# Patient Record
Sex: Female | Born: 1980
Health system: Southern US, Community
[De-identification: ages and names within clinical notes are randomized; demographics above are authoritative.]

## PROBLEM LIST (undated history)

## (undated) ENCOUNTER — Inpatient Hospital Stay (HOSPITAL_COMMUNITY): Payer: Self-pay

## (undated) DIAGNOSIS — O219 Vomiting of pregnancy, unspecified: Secondary | ICD-10-CM

## (undated) DIAGNOSIS — F419 Anxiety disorder, unspecified: Secondary | ICD-10-CM

## (undated) DIAGNOSIS — F32A Depression, unspecified: Secondary | ICD-10-CM

## (undated) DIAGNOSIS — F1110 Opioid abuse, uncomplicated: Secondary | ICD-10-CM

## (undated) DIAGNOSIS — Z22322 Carrier or suspected carrier of Methicillin resistant Staphylococcus aureus: Secondary | ICD-10-CM

## (undated) DIAGNOSIS — N764 Abscess of vulva: Secondary | ICD-10-CM

## (undated) DIAGNOSIS — F329 Major depressive disorder, single episode, unspecified: Secondary | ICD-10-CM

## (undated) DIAGNOSIS — O24419 Gestational diabetes mellitus in pregnancy, unspecified control: Secondary | ICD-10-CM

## (undated) DIAGNOSIS — F17200 Nicotine dependence, unspecified, uncomplicated: Secondary | ICD-10-CM

## (undated) HISTORY — PX: TONSILLECTOMY: SUR1361

## (undated) HISTORY — PX: CHOLECYSTECTOMY: SHX55

## (undated) HISTORY — DX: Vomiting of pregnancy, unspecified: O21.9

## (undated) HISTORY — DX: Gestational diabetes mellitus in pregnancy, unspecified control: O24.419

## (undated) HISTORY — DX: Nicotine dependence, unspecified, uncomplicated: F17.200

---
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
## 2002-08-05 ENCOUNTER — Encounter: Payer: Self-pay | Admitting: Emergency Medicine

## 2002-08-05 ENCOUNTER — Emergency Department (HOSPITAL_COMMUNITY): Admission: EM | Admit: 2002-08-05 | Discharge: 2002-08-05 | Payer: Self-pay | Admitting: Emergency Medicine

## 2002-08-27 ENCOUNTER — Ambulatory Visit (HOSPITAL_COMMUNITY): Admission: RE | Admit: 2002-08-27 | Discharge: 2002-08-28 | Payer: Self-pay | Admitting: General Surgery

## 2002-08-27 ENCOUNTER — Encounter: Payer: Self-pay | Admitting: General Surgery

## 2002-08-27 ENCOUNTER — Encounter (INDEPENDENT_AMBULATORY_CARE_PROVIDER_SITE_OTHER): Payer: Self-pay | Admitting: *Deleted

## 2004-06-02 ENCOUNTER — Other Ambulatory Visit: Admission: RE | Admit: 2004-06-02 | Discharge: 2004-06-02 | Payer: Self-pay | Admitting: Obstetrics and Gynecology

## 2007-01-10 ENCOUNTER — Emergency Department (HOSPITAL_COMMUNITY): Admission: EM | Admit: 2007-01-10 | Discharge: 2007-01-10 | Payer: Self-pay | Admitting: Emergency Medicine

## 2007-06-17 ENCOUNTER — Emergency Department (HOSPITAL_COMMUNITY): Admission: EM | Admit: 2007-06-17 | Discharge: 2007-06-17 | Payer: Self-pay | Admitting: Emergency Medicine

## 2008-12-13 ENCOUNTER — Emergency Department (HOSPITAL_COMMUNITY): Admission: EM | Admit: 2008-12-13 | Discharge: 2008-12-13 | Payer: Self-pay | Admitting: Emergency Medicine

## 2009-04-18 ENCOUNTER — Emergency Department (HOSPITAL_COMMUNITY): Admission: EM | Admit: 2009-04-18 | Discharge: 2009-04-18 | Payer: Self-pay | Admitting: Family Medicine

## 2009-09-03 ENCOUNTER — Inpatient Hospital Stay (HOSPITAL_COMMUNITY): Admission: AD | Admit: 2009-09-03 | Discharge: 2009-09-06 | Payer: Self-pay | Admitting: Obstetrics and Gynecology

## 2009-09-03 ENCOUNTER — Encounter: Payer: Self-pay | Admitting: Obstetrics and Gynecology

## 2009-10-27 ENCOUNTER — Emergency Department (HOSPITAL_COMMUNITY): Admission: EM | Admit: 2009-10-27 | Discharge: 2009-10-28 | Payer: Self-pay | Admitting: Emergency Medicine

## 2010-03-24 LAB — TRICYCLICS SCREEN, URINE: TCA Scrn: NOT DETECTED

## 2010-03-24 LAB — BASIC METABOLIC PANEL
BUN: 12 mg/dL (ref 6–23)
Chloride: 106 mEq/L (ref 96–112)
Creatinine, Ser: 0.82 mg/dL (ref 0.4–1.2)
Glucose, Bld: 78 mg/dL (ref 70–99)
Potassium: 4 mEq/L (ref 3.5–5.1)

## 2010-03-24 LAB — CBC
HCT: 35 % — ABNORMAL LOW (ref 36.0–46.0)
MCHC: 32.8 g/dL (ref 30.0–36.0)
MCV: 83 fL (ref 78.0–100.0)
Platelets: 336 10*3/uL (ref 150–400)
RDW: 19.2 % — ABNORMAL HIGH (ref 11.5–15.5)
WBC: 7.7 10*3/uL (ref 4.0–10.5)

## 2010-03-24 LAB — RAPID URINE DRUG SCREEN, HOSP PERFORMED
Amphetamines: NOT DETECTED
Barbiturates: NOT DETECTED
Benzodiazepines: NOT DETECTED
Cocaine: NOT DETECTED

## 2010-03-24 LAB — DIFFERENTIAL
Basophils Absolute: 0.1 10*3/uL (ref 0.0–0.1)
Basophils Relative: 1 % (ref 0–1)
Eosinophils Absolute: 0.1 10*3/uL (ref 0.0–0.7)
Eosinophils Relative: 2 % (ref 0–5)
Monocytes Absolute: 0.4 10*3/uL (ref 0.1–1.0)

## 2010-03-24 LAB — ETHANOL: Alcohol, Ethyl (B): <5 mg/dL (ref 0–10)

## 2010-03-26 LAB — RPR: RPR Ser Ql: NONREACTIVE

## 2010-03-26 LAB — COMPREHENSIVE METABOLIC PANEL
AST: 26 U/L (ref 0–37)
CO2: 19 mEq/L (ref 19–32)
Calcium: 9.4 mg/dL (ref 8.4–10.5)
Creatinine, Ser: 0.45 mg/dL (ref 0.4–1.2)
GFR calc Af Amer: >60 mL/min (ref >60)
GFR calc non Af Amer: >60 mL/min (ref >60)
Sodium: 134 mEq/L — ABNORMAL LOW (ref 135–145)
Total Protein: 6.4 g/dL (ref 6.0–8.3)

## 2010-03-26 LAB — CBC
HCT: 30.1 % — ABNORMAL LOW (ref 36.0–46.0)
HCT: 38.1 % (ref 36.0–46.0)
Hemoglobin: 10.6 g/dL — ABNORMAL LOW (ref 12.0–15.0)
MCH: 33.3 pg (ref 26.0–34.0)
MCHC: 34.3 g/dL (ref 30.0–36.0)
Platelets: 217 10*3/uL (ref 150–400)
RBC: 3.18 MIL/uL — ABNORMAL LOW (ref 3.87–5.11)
RDW: 13.4 % (ref 11.5–15.5)
WBC: 12.6 10*3/uL — ABNORMAL HIGH (ref 4.0–10.5)

## 2010-03-26 LAB — URINALYSIS, DIPSTICK ONLY
Bilirubin Urine: NEGATIVE
Glucose, UA: NEGATIVE mg/dL
Leukocytes, UA: NEGATIVE
Nitrite: NEGATIVE
Specific Gravity, Urine: 1.02 (ref 1.005–1.030)
pH: 7.5 (ref 5.0–8.0)

## 2010-04-13 LAB — POCT I-STAT, CHEM 8
BUN: 11 mg/dL (ref 6–23)
Calcium, Ion: 1.03 mmol/L — ABNORMAL LOW (ref 1.12–1.32)
Chloride: 107 mEq/L (ref 96–112)
Creatinine, Ser: 0.5 mg/dL (ref 0.4–1.2)
Glucose, Bld: 73 mg/dL (ref 70–99)
HCT: 39 % (ref 36.0–46.0)
Hemoglobin: 13.3 g/dL (ref 12.0–15.0)
Potassium: 3.6 meq/L (ref 3.5–5.1)
Sodium: 138 mEq/L (ref 135–145)
TCO2: 21 mmol/L (ref 0–100)

## 2010-04-13 LAB — HCG, SERUM, QUALITATIVE: Preg, Serum: NEGATIVE

## 2010-05-28 NOTE — Op Note (Signed)
NAME:  Alexis Avery, Alexis Avery NO.:  [id number]   MEDICAL RECORD NO.:  [id number]                   PATIENT TYPE:  OIB   LOCATION:  5743                                 FACILITY:  [facility]   PHYSICIAN:  Gabrielle Dare. Janee Morn, M.D.             DATE OF BIRTH:  10/12/1980   DATE OF PROCEDURE:  08/27/2002  DATE OF DISCHARGE:                                 OPERATIVE REPORT   PREOPERATIVE DIAGNOSIS:  Acalculous cholecystitis versus biliary dyskinesia.   POSTOPERATIVE DIAGNOSIS:  Acalculous cholecystitis versus biliary  dyskinesia.   OPERATION PERFORMED:  Laparoscopic cholecystectomy with intraoperative  cholangiogram.   SURGEON:  Gabrielle Dare. Janee Morn, M.D.   ASSISTANT:  Adolph Pollack, M.D.   ANESTHESIA:  General.   INDICATIONS FOR PROCEDURE:  The patient is a 30 year old white female with a  one-year history of right upper quadrant pain with some associated nausea  and vomiting after eating fatty foods.  Work-up in the emergency department  demonstrated some edema in the neck of the gallbladder but gallbladder  stones.  She was tender with the ultrasound exam and she was evaluated in  the office.  She presents for elective cholecystectomy.   DESCRIPTION OF PROCEDURE:  Informed consent was obtained.  Intravenous  antibiotics were given.  The patient was taken to the operating room.  General anesthesia was administered.  Her abdomen was prepped and draped in  the sterile fashion.  A curvilinear infraumbilical incision was made.  Subcutaneous tissues were dissected down, revealing the anterior fascia  which was divided sharply.  The peritoneal cavity was entered under direct  vision.  Subsequently a pursestring suture of 0 Vicryl was placed around the  fascial opening.  The Hasson trocar was inserted and the abdomen was  insufflated with carbon dioxide in standard fashion.  Subsequently, an 11mm  epigastric and two 5mm lateral ports were placed.  The dome of  the  gallbladder was retracted superomedially and the infundibulum was retracted  inferolaterally.  The dissection began laterally and progressed medially  revealing the cystic duct.  The gallbladder and cystic duct area appeared  mildly inflamed.  The cystic duct was encircled without difficulty.  A large  window was made between the infundibulum and the cystic duct and the liver.  Once excellent visualization was obtained, a clip was placed on the  infundibulocystic duct junction.  A small nick was made in the cystic duct  and a Reddick cholangiogram catheter was inserted.  An intraoperative  cholangiogram was obtained which demonstrated no filling defects in the  common bile duct and good flow into the duodenum.  Subsequently, the  cholangiogram catheter was removed.  Three clips were placed proximally on  the cystic duct and it was divided.  The further dissection revealed the  cystic artery. This was clipped twice proximally, once distally and divided.  We then proceeded to take the gallbladder off the liver bed.  During  this  dissection, a posterior branch of the cystic artery was encountered.  This  was clipped twice proximally and  divided distally.  The gallbladder was  taken off of the liver bed with Bovie cautery.  Several areas of the liver  were cauterized to get excellent hemostasis.  The gallbladder was placed in  the EndoCatch bag and removed from the abdomen via the umbilical port site.  The abdomen was copiously irrigated.  The liver bed was again cauterized in  a few areas to obtain excellent hemostasis.  Once this was acquired, the  abdomen was then irrigated.  The irrigant returned clear.  The liver bed was  rechecked and noted to be hemostatic.  All irrigation fluid was removed  which was clear. The ports were removed under direct vision.  The Hasson  trocar was removed.  The umbilical fascial defect was closed by tying the 0  Vicryl pursestring suture.  All four  wounds were copiously irrigated and  then closed with 4-0 Vicryl subcuticular stitches.  0.25% Marcaine with  epinephrine had been used at all port sites for local anesthetic and sponge,  needle and instrument counts were correct.  Benzoin and Steri-Strips and  sterile dressings were applied.  The patient tolerated the procedure without  apparent complications and was taken to recovery room in stable condition.                                                Gabrielle Dare Janee Morn, M.D.    BET/MEDQ  D:  08/27/2002  T:  08/28/2002  Job:  [id number]

## 2010-10-07 LAB — DIFFERENTIAL
Eosinophils Absolute: 0.2
Eosinophils Relative: 1
Lymphs Abs: 4.6 — ABNORMAL HIGH
Monocytes Absolute: 0.9
Monocytes Relative: 7

## 2010-10-07 LAB — CBC
HCT: 33.9 — ABNORMAL LOW
MCV: 89.3
RBC: 3.8 — ABNORMAL LOW
WBC: 13.4 — ABNORMAL HIGH

## 2010-10-07 LAB — POCT I-STAT, CHEM 8
Calcium, Ion: 1.14
Chloride: 107
Glucose, Bld: 94
HCT: 36
Hemoglobin: 12.2
Potassium: 3.7

## 2010-10-07 LAB — URINALYSIS, ROUTINE W REFLEX MICROSCOPIC
Glucose, UA: NEGATIVE
Ketones, ur: 15 — AB
Protein, ur: 100 — AB

## 2010-10-07 LAB — POCT PREGNANCY, URINE: Operator id: 285491

## 2010-10-07 LAB — RPR: RPR Ser Ql: NONREACTIVE

## 2010-10-07 LAB — URINE MICROSCOPIC-ADD ON

## 2010-10-15 LAB — I-STAT 8, (EC8 V) (CONVERTED LAB)
BUN: 6
Bicarbonate: 19.9 — ABNORMAL LOW
Chloride: 109
pCO2, Ven: 27.7 — ABNORMAL LOW
pH, Ven: 7.465 — ABNORMAL HIGH

## 2010-10-15 LAB — D-DIMER, QUANTITATIVE: D-Dimer, Quant: 0.66 — ABNORMAL HIGH

## 2010-10-15 LAB — POCT CARDIAC MARKERS
CKMB, poc: <1 — ABNORMAL LOW
Myoglobin, poc: 53.2
Operator id: 198171
Troponin i, poc: <0.05

## 2012-05-21 ENCOUNTER — Emergency Department (HOSPITAL_COMMUNITY)
Admission: EM | Admit: 2012-05-21 | Discharge: 2012-05-21 | Disposition: A | Discharge status: Home / Self Care | Payer: Self-pay

## 2012-05-21 ENCOUNTER — Inpatient Hospital Stay (HOSPITAL_COMMUNITY)
Admission: AD | Admit: 2012-05-21 | Discharge: 2012-05-21 | Disposition: A | Discharge status: Home / Self Care | Payer: Medicaid Other | Source: Ambulatory Visit | Attending: Obstetrics & Gynecology | Admitting: Obstetrics & Gynecology

## 2012-05-21 ENCOUNTER — Inpatient Hospital Stay (HOSPITAL_COMMUNITY): Payer: Medicaid Other

## 2012-05-21 ENCOUNTER — Encounter (HOSPITAL_COMMUNITY): Payer: Self-pay | Admitting: *Deleted

## 2012-05-21 DIAGNOSIS — O039 Complete or unspecified spontaneous abortion without complication: Secondary | ICD-10-CM | POA: Insufficient documentation

## 2012-05-21 DIAGNOSIS — R109 Unspecified abdominal pain: Secondary | ICD-10-CM | POA: Insufficient documentation

## 2012-05-21 HISTORY — DX: Major depressive disorder, single episode, unspecified: F32.9

## 2012-05-21 HISTORY — DX: Depression, unspecified: F32.A

## 2012-05-21 LAB — CBC
MCH: 29.6 pg (ref 26.0–34.0)
Platelets: 212 10*3/uL (ref 150–400)
RBC: 3.62 MIL/uL — ABNORMAL LOW (ref 3.87–5.11)
WBC: 10.3 10*3/uL (ref 4.0–10.5)

## 2012-05-21 LAB — URINALYSIS, ROUTINE W REFLEX MICROSCOPIC
Ketones, ur: NEGATIVE mg/dL
Leukocytes, UA: NEGATIVE
Nitrite: NEGATIVE
Protein, ur: NEGATIVE mg/dL
Urobilinogen, UA: 0.2 mg/dL (ref 0.0–1.0)

## 2012-05-21 LAB — URINE MICROSCOPIC-ADD ON

## 2012-05-21 LAB — WET PREP, GENITAL: Yeast Wet Prep HPF POC: NONE SEEN

## 2012-05-21 LAB — HCG, SERUM, QUALITATIVE: Preg, Serum: POSITIVE — AB

## 2012-05-21 NOTE — MAU Note (Signed)
Patient was told at [facility] that her pregnancy test was positive.

## 2012-05-21 NOTE — MAU Provider Note (Signed)
History     CSN: [id number]  Arrival date and time: 05/21/12 1753   None     Chief Complaint  Patient presents with  . Abdominal Cramping  . Vaginal Bleeding   HPI 32 y.o. G1P0101 at Unknown with cramping and bleeding starting today. Had + pregnancy test at [facility] last week.   Past Medical History  Diagnosis Date  . Depression     Past Surgical History  Procedure Laterality Date  . Cholecystectomy    . Tonsillectomy    . Vaginal delivery      X 1    History reviewed. No pertinent family history.  History  Substance Use Topics  . Smoking status: Current Every Day Smoker -- 1.00 packs/day    Types: Cigarettes  . Smokeless tobacco: Never Used  . Alcohol Use: No    Allergies: No Known Allergies  Prescriptions prior to admission  Medication Sig Dispense Refill  . acetaminophen (TYLENOL) 325 MG tablet Take 650 mg by mouth daily as needed for pain.      Marland Kitchen FLUoxetine (PROZAC) 20 MG capsule Take 20 mg by mouth daily.      Marland Kitchen gabapentin (NEURONTIN) 300 MG capsule Take 300 mg by mouth 2 (two) times daily.      . Prenatal Vit-Fe Fumarate-FA (PRENATAL MULTIVITAMIN) TABS Take 1 tablet by mouth daily at 12 noon.        Review of Systems  Constitutional: Negative.   Respiratory: Negative.   Cardiovascular: Negative.   Gastrointestinal: Positive for abdominal pain. Negative for nausea, vomiting, diarrhea and constipation.  Genitourinary: Negative for dysuria, urgency, frequency, hematuria and flank pain.       Positive for vaginal bleeding   Musculoskeletal: Negative.   Neurological: Negative.   Psychiatric/Behavioral: Negative.    Physical Exam   Blood pressure 131/79, pulse 84, temperature 98.8 F (37.1 C), temperature source Oral, resp. rate 16, height 4\' 9"  (1.448 m), weight 101 lb 4 oz (45.927 kg), last menstrual period 03/29/2012, SpO2 100.00%.  Physical Exam  Nursing note and vitals reviewed. Constitutional: She is oriented to person, place, and time. She  appears well-developed and well-nourished. No distress.  Cardiovascular: Normal rate.   Respiratory: Effort normal.  GI: Soft. There is no tenderness.  Genitourinary: There is no rash, tenderness or lesion on the right labia. There is no rash, tenderness or lesion on the left labia. Uterus is tender. Right adnexum displays tenderness. Left adnexum displays tenderness. There is bleeding (heavy) around the vagina.  Musculoskeletal: Normal range of motion.  Neurological: She is alert and oriented to person, place, and time.  Skin: Skin is warm and dry.  Psychiatric: She has a normal mood and affect.    MAU Course  Procedures Results for orders placed during the hospital encounter of 05/21/12 (from the past 24 hour(s))  URINALYSIS, ROUTINE W REFLEX MICROSCOPIC     Status: Abnormal   Collection Time    05/21/12  6:25 PM      Result Value Range   Color, Urine AMBER (*) YELLOW   APPearance CLOUDY (*) CLEAR   Specific Gravity, Urine 1.020  1.005 - 1.030   pH 7.5  5.0 - 8.0   Glucose, UA NEGATIVE  NEGATIVE mg/dL   Hgb urine dipstick LARGE (*) NEGATIVE   Bilirubin Urine NEGATIVE  NEGATIVE   Ketones, ur NEGATIVE  NEGATIVE mg/dL   Protein, ur NEGATIVE  NEGATIVE mg/dL   Urobilinogen, UA 0.2  0.0 - 1.0 mg/dL   Nitrite NEGATIVE  NEGATIVE  Leukocytes, UA NEGATIVE  NEGATIVE  URINE MICROSCOPIC-ADD ON     Status: Abnormal   Collection Time    05/21/12  6:25 PM      Result Value Range   Squamous Epithelial / LPF RARE  RARE   WBC, UA 0-2  <3 WBC/hpf   RBC / HPF TOO NUMEROUS TO COUNT  <3 RBC/hpf   Bacteria, UA FEW (*) RARE  POCT PREGNANCY, URINE     Status: None   Collection Time    05/21/12  6:32 PM      Result Value Range   Preg Test, Ur NEGATIVE  NEGATIVE  HCG, SERUM, QUALITATIVE     Status: Abnormal   Collection Time    05/21/12  6:55 PM      Result Value Range   Preg, Serum POSITIVE (*) NEGATIVE  ABO/RH     Status: None   Collection Time    05/21/12  6:55 PM      Result Value Range    ABO/RH(D) O POS    CBC     Status: Abnormal   Collection Time    05/21/12  6:55 PM      Result Value Range   WBC 10.3  4.0 - 10.5 K/uL   RBC 3.62 (*) 3.87 - 5.11 MIL/uL   Hemoglobin 10.7 (*) 12.0 - 15.0 g/dL   HCT 16.1 (*) 09.6 - 04.5 %   MCV 86.7  78.0 - 100.0 fL   MCH 29.6  26.0 - 34.0 pg   MCHC 34.1  30.0 - 36.0 g/dL   RDW 40.9  81.1 - 91.4 %   Platelets 212  150 - 400 K/uL  HCG, QUANTITATIVE, PREGNANCY     Status: Abnormal   Collection Time    05/21/12  6:55 PM      Result Value Range   hCG, Beta Chain, Quant, S 19 (*) <5 mIU/mL  WET PREP, GENITAL     Status: Abnormal   Collection Time    05/21/12  6:58 PM      Result Value Range   Yeast Wet Prep HPF POC NONE SEEN  NONE SEEN   Trich, Wet Prep NONE SEEN  NONE SEEN   Clue Cells Wet Prep HPF POC NONE SEEN  NONE SEEN   WBC, Wet Prep HPF POC FEW (*) NONE SEEN     Assessment and Plan  Serum pregnancy test pending Care assumed by Joseph Berkshire, PA  Vernie Ammons, JULIE N. 05/21/2012, 9:25 PM   2000- care assumed from Kinnie Feil, CNM  Results for orders placed during the hospital encounter of 05/21/12 (from the past 24 hour(s))  URINALYSIS, ROUTINE W REFLEX MICROSCOPIC     Status: Abnormal   Collection Time    05/21/12  6:25 PM      Result Value Range   Color, Urine AMBER (*) YELLOW   APPearance CLOUDY (*) CLEAR   Specific Gravity, Urine 1.020  1.005 - 1.030   pH 7.5  5.0 - 8.0   Glucose, UA NEGATIVE  NEGATIVE mg/dL   Hgb urine dipstick LARGE (*) NEGATIVE   Bilirubin Urine NEGATIVE  NEGATIVE   Ketones, ur NEGATIVE  NEGATIVE mg/dL   Protein, ur NEGATIVE  NEGATIVE mg/dL   Urobilinogen, UA 0.2  0.0 - 1.0 mg/dL   Nitrite NEGATIVE  NEGATIVE   Leukocytes, UA NEGATIVE  NEGATIVE  URINE MICROSCOPIC-ADD ON     Status: Abnormal   Collection Time    05/21/12  6:25 PM  Result Value Range   Squamous Epithelial / LPF RARE  RARE   WBC, UA 0-2  <3 WBC/hpf   RBC / HPF TOO NUMEROUS TO COUNT  <3 RBC/hpf   Bacteria, UA FEW (*)  RARE  POCT PREGNANCY, URINE     Status: None   Collection Time    05/21/12  6:32 PM      Result Value Range   Preg Test, Ur NEGATIVE  NEGATIVE  HCG, SERUM, QUALITATIVE     Status: Abnormal   Collection Time    05/21/12  6:55 PM      Result Value Range   Preg, Serum POSITIVE (*) NEGATIVE  ABO/RH     Status: None   Collection Time    05/21/12  6:55 PM      Result Value Range   ABO/RH(D) O POS    CBC     Status: Abnormal   Collection Time    05/21/12  6:55 PM      Result Value Range   WBC 10.3  4.0 - 10.5 K/uL   RBC 3.62 (*) 3.87 - 5.11 MIL/uL   Hemoglobin 10.7 (*) 12.0 - 15.0 g/dL   HCT 16.1 (*) 09.6 - 04.5 %   MCV 86.7  78.0 - 100.0 fL   MCH 29.6  26.0 - 34.0 pg   MCHC 34.1  30.0 - 36.0 g/dL   RDW 40.9  81.1 - 91.4 %   Platelets 212  150 - 400 K/uL  HCG, QUANTITATIVE, PREGNANCY     Status: Abnormal   Collection Time    05/21/12  6:55 PM      Result Value Range   hCG, Beta Chain, Quant, S 19 (*) <5 mIU/mL  WET PREP, GENITAL     Status: Abnormal   Collection Time    05/21/12  6:58 PM      Result Value Range   Yeast Wet Prep HPF POC NONE SEEN  NONE SEEN   Trich, Wet Prep NONE SEEN  NONE SEEN   Clue Cells Wet Prep HPF POC NONE SEEN  NONE SEEN   WBC, Wet Prep HPF POC FEW (*) NONE SEEN   US Ob Comp Less 14 Wks  05/21/2012  *RADIOLOGY REPORT*  Clinical Data: vaginal bleeding ; ;  OBSTETRIC <14 WK [place] AND TRANSVAGINAL OB US  Technique: Both transabdominal and transvaginal ultrasound examinations were performed for complete evaluation of the gestation as well as the maternal uterus, adnexal regions, and pelvic cul-de-sac.  Comparison: None.  Findings: No intrauterine gestation is visualized.  Endometrium normal thickness at 6 mm.  Right corpus luteal cyst noted which measures 2.2 cm.  Ovaries otherwise symmetric and unremarkable. Trace free fluid in the pelvis.  IMPRESSION: No intrauterine gestation currently.  This could be related to early pregnancy too early to visualize,  spontaneous abortion, or occult ectopic pregnancy.  Recommend follow up with serial quantitative beta HCGs and ultrasounds.   Original Report Authenticated By: Charlett Nose, M.D.    US Ob Transvaginal  05/21/2012  *RADIOLOGY REPORT*  Clinical Data: vaginal bleeding ; ;  OBSTETRIC <14 WK [place] AND TRANSVAGINAL OB US  Technique: Both transabdominal and transvaginal ultrasound examinations were performed for complete evaluation of the gestation as well as the maternal uterus, adnexal regions, and pelvic cul-de-sac.  Comparison: None.  Findings: No intrauterine gestation is visualized.  Endometrium normal thickness at 6 mm.  Right corpus luteal cyst noted which measures 2.2 cm.  Ovaries otherwise symmetric and unremarkable. Trace free fluid  in the pelvis.  IMPRESSION: No intrauterine gestation currently.  This could be related to early pregnancy too early to visualize, spontaneous abortion, or occult ectopic pregnancy.  Recommend follow up with serial quantitative beta HCGs and ultrasounds.   Original Report Authenticated By: Charlett Nose, M.D.     MDM Quant, hCG added to previously drawn labs Patient will have [place] due to + qualitative hCG  A: SAB  P: Discharge home Patient to return in 48 hours for quant hCG Patient will follow-up in clinic in 2 weeks Bleeding precautions discussed Patient may return to MAU as needed or if her condition were to change or worsen  Freddi Starr, PA-C  05/21/2012 9:25 PM

## 2012-05-21 NOTE — MAU Note (Signed)
Patient is in with c/o abdominal cramping and vaginal bleeding that have increased from spotting to moderate bleeding today. No recent intercourse. lmp 03/29/12. 2 day spotting in April (4/20-4/22)

## 2012-05-22 LAB — ABO/RH: ABO/RH(D): O POS

## 2012-05-24 NOTE — MAU Provider Note (Signed)
Attestation of Attending Supervision of Advanced Practitioner (CNM/NP): Evaluation and management procedures were performed by the Advanced Practitioner under my supervision and collaboration. I have reviewed the Advanced Practitioner's note and chart, and I agree with the management and plan.  Janziel Hockett H. 3:34 PM   

## 2012-06-07 ENCOUNTER — Encounter: Payer: Medicaid Other | Admitting: Medical

## 2012-06-27 ENCOUNTER — Other Ambulatory Visit: Payer: Medicaid Other

## 2012-09-15 ENCOUNTER — Encounter (HOSPITAL_COMMUNITY): Payer: Self-pay

## 2012-09-15 ENCOUNTER — Inpatient Hospital Stay (HOSPITAL_COMMUNITY)
Admission: AD | Admit: 2012-09-15 | Discharge: 2012-09-15 | Disposition: A | Discharge status: Home / Self Care | Payer: Medicaid Other | Source: Ambulatory Visit | Attending: Family Medicine | Admitting: Family Medicine

## 2012-09-15 ENCOUNTER — Inpatient Hospital Stay (HOSPITAL_COMMUNITY): Payer: Medicaid Other

## 2012-09-15 DIAGNOSIS — O99340 Other mental disorders complicating pregnancy, unspecified trimester: Secondary | ICD-10-CM | POA: Insufficient documentation

## 2012-09-15 DIAGNOSIS — G2581 Restless legs syndrome: Secondary | ICD-10-CM | POA: Insufficient documentation

## 2012-09-15 DIAGNOSIS — F341 Dysthymic disorder: Secondary | ICD-10-CM | POA: Insufficient documentation

## 2012-09-15 DIAGNOSIS — R109 Unspecified abdominal pain: Secondary | ICD-10-CM | POA: Insufficient documentation

## 2012-09-15 DIAGNOSIS — O219 Vomiting of pregnancy, unspecified: Secondary | ICD-10-CM

## 2012-09-15 DIAGNOSIS — F419 Anxiety disorder, unspecified: Secondary | ICD-10-CM

## 2012-09-15 DIAGNOSIS — O210 Mild hyperemesis gravidarum: Secondary | ICD-10-CM | POA: Insufficient documentation

## 2012-09-15 HISTORY — DX: Anxiety disorder, unspecified: F41.9

## 2012-09-15 LAB — URINALYSIS, ROUTINE W REFLEX MICROSCOPIC
Bilirubin Urine: NEGATIVE
Ketones, ur: NEGATIVE mg/dL
Leukocytes, UA: NEGATIVE
Nitrite: NEGATIVE
Specific Gravity, Urine: <1.005 — ABNORMAL LOW (ref 1.005–1.030)

## 2012-09-15 LAB — POCT PREGNANCY, URINE: Preg Test, Ur: POSITIVE — AB

## 2012-09-15 LAB — WET PREP, GENITAL: Trich, Wet Prep: NONE SEEN

## 2012-09-15 LAB — URINE MICROSCOPIC-ADD ON

## 2012-09-15 MED ORDER — FLUOXETINE HCL 20 MG PO CAPS
20.0000 mg | ORAL_CAPSULE | Freq: Every day | ORAL | Status: DC
  Filled 2012-09-15 (×2): qty 1

## 2012-09-15 MED ORDER — ONDANSETRON 8 MG PO TBDP: 8.0000 mg | ORAL_TABLET | Freq: Once | ORAL | Status: DC

## 2012-09-15 MED ORDER — FLUOXETINE HCL 20 MG PO CAPS: 40.0000 mg | ORAL_CAPSULE | Freq: Every day | ORAL | Status: DC

## 2012-09-15 MED ORDER — ONDANSETRON 8 MG PO TBDP
8.0000 mg | ORAL_TABLET | Freq: Once | ORAL | Status: AC
  Administered 2012-09-15: 8 mg via ORAL
  Filled 2012-09-15: qty 1

## 2012-09-15 MED ORDER — PROMETHAZINE HCL 25 MG PO TABS: 25.0000 mg | ORAL_TABLET | Freq: Four times a day (QID) | ORAL | Status: DC | PRN

## 2012-09-15 MED ORDER — DOXYLAMINE-PYRIDOXINE 10-10 MG PO TBEC: 2.0000 | DELAYED_RELEASE_TABLET | Freq: Every day | ORAL | Status: DC

## 2012-09-15 MED ORDER — ONDANSETRON 8 MG PO TBDP: 8.0000 mg | ORAL_TABLET | Freq: Three times a day (TID) | ORAL | Status: DC | PRN

## 2012-09-15 NOTE — MAU Note (Signed)
Pt declines 20mg  prozac, has at home.

## 2012-09-15 NOTE — MAU Note (Signed)
Pt states LMP-07/18/2012, having morning sickness, ?yeast infection, and also has stopped taking neurontin and decreased her dose of prozac 40mg  down to 20mg s. Has restless legs and has had difficulty sleeping. Hx panic attacks and after stopping neurontin panic attacks are returning.

## 2012-09-15 NOTE — MAU Provider Note (Signed)
Chart reviewed and agree with management and plan.  

## 2012-09-15 NOTE — MAU Note (Signed)
No adverse reaction to zofran, no change

## 2012-09-15 NOTE — MAU Provider Note (Signed)
History     CSN: [id number]  Arrival date and time: 09/15/12 1036   None     Chief Complaint  Patient presents with  . Morning Sickness  . Emesis During Pregnancy   HPIpt is [redacted]w[redacted]d pregnant and presents with nausea and vomiting- pt states she is having nausea and vomiting all day since she found out she was pregnant. 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  Pt has been on Prozac 40mg  decreased to 20mg  and also pt stopped neurontin last week For anxiety and restless legs.Pt is not able to sleep, has headache and dizziness.  Pt has been advised to have OB monitor medications and pt has not yet established care due to delay in pregnancy Medicaid-  Pt has vaginal discharge with odor and itching for 2 days; pt is not having any itching today- itching mostly external. Pt has slight abdominal cramping without spotting or bleeding.  Pt saw [facility] and plans to see Dr. Henderson Cloud and advised ultrasound and eval. Dr. Henderson Cloud does not have appt for another month.    Pt states LMP-07/18/2012, having morning sickness, ?yeast infection, and also has stopped taking neurontin and decreased her dose of prozac 40mg  down to 20mg s. Has restless legs and has had difficulty sleeping. Hx panic attacks and after stopping neurontin panic attacks are returning     Past Medical History  Diagnosis Date  . Depression   . Preterm labor   . Anxiety     Past Surgical History  Procedure Laterality Date  . Cholecystectomy    . Tonsillectomy    . Vaginal delivery      X 1    History reviewed. No pertinent family history.  History  Substance Use Topics  . Smoking status: Current Every Day Smoker -- 0.50 packs/day    Types: Cigarettes  . Smokeless tobacco: Never Used  . Alcohol Use: No    Allergies: No Known Allergies  Prescriptions prior to admission  Medication Sig Dispense Refill  . acetaminophen (TYLENOL) 325 MG tablet  Take 650 mg by mouth daily as needed for pain.      Marland Kitchen FLUoxetine (PROZAC) 20 MG capsule Take 20 mg by mouth daily.      Marland Kitchen gabapentin (NEURONTIN) 300 MG capsule Take 300 mg by mouth 2 (two) times daily.      . Prenatal Vit-Fe Fumarate-FA (PRENATAL MULTIVITAMIN) TABS Take 1 tablet by mouth daily at 12 noon.        Review of Systems  Constitutional: Negative for fever and chills.  Gastrointestinal: Positive for nausea and vomiting. Negative for abdominal pain, diarrhea and constipation.  Psychiatric/Behavioral: The patient is nervous/anxious and has insomnia.    Physical Exam   Blood pressure 105/66, pulse 70, temperature 98.6 F (37 C), temperature source Oral, resp. rate 16, height 4\' 9"  (1.448 m), weight 43.262 kg (95 lb 6 oz), last menstrual period 07/18/2012.  Physical Exam  Nursing note and vitals reviewed. Constitutional: She is oriented to person, place, and time. She appears well-developed and well-nourished. No distress.  Pleasant but anxious appearing  HENT:  Head: Normocephalic.  Eyes: Pupils are equal, round, and reactive to light.  Neck: Normal range of motion. Neck supple.  Cardiovascular: Normal rate.   Respiratory: Effort normal.  GI: Soft.  Genitourinary: Vagina normal and uterus normal. No vaginal discharge found.  No reddness or erythema or lesions or fissures noted  Musculoskeletal: Normal range of  motion.  Neurological: She is alert and oriented to person, place, and time.  Skin: Skin is warm and dry.  Psychiatric: She has a normal mood and affect.    MAU Course  Procedures Results for orders placed during the hospital encounter of 09/15/12 (from the past 24 hour(s))  URINALYSIS, ROUTINE W REFLEX MICROSCOPIC     Status: Abnormal   Collection Time    09/15/12 10:53 AM      Result Value Range   Color, Urine YELLOW  YELLOW   APPearance CLEAR  CLEAR   Specific Gravity, Urine <1.005 (*) 1.005 - 1.030   pH 6.0  5.0 - 8.0   Glucose, UA NEGATIVE  NEGATIVE  mg/dL   Hgb urine dipstick TRACE (*) NEGATIVE   Bilirubin Urine NEGATIVE  NEGATIVE   Ketones, ur NEGATIVE  NEGATIVE mg/dL   Protein, ur NEGATIVE  NEGATIVE mg/dL   Urobilinogen, UA 0.2  0.0 - 1.0 mg/dL   Nitrite NEGATIVE  NEGATIVE   Leukocytes, UA NEGATIVE  NEGATIVE  URINE MICROSCOPIC-ADD ON     Status: Abnormal   Collection Time    09/15/12 10:53 AM      Result Value Range   Squamous Epithelial / LPF FEW (*) RARE   WBC, UA 0-2  <3 WBC/hpf   RBC / HPF 0-2  <3 RBC/hpf   Bacteria, UA RARE  RARE  POCT PREGNANCY, URINE     Status: Abnormal   Collection Time    09/15/12 11:06 AM      Result Value Range   Preg Test, Ur POSITIVE (*) NEGATIVE  WET PREP, GENITAL     Status: Abnormal   Collection Time    09/15/12  1:50 PM      Result Value Range   Yeast Wet Prep HPF POC NONE SEEN  NONE SEEN   Trich, Wet Prep NONE SEEN  NONE SEEN   Clue Cells Wet Prep HPF POC FEW (*) NONE SEEN   WBC, Wet Prep HPF POC FEW (*) NONE SEEN   Discussed with Dr. Shawnie Pons about pt's anxiety and medications- recommended pt go back to Prozac 40mg  daily Prozac 20mg  given in MAU - pt has already taken 20mg  this morning Pt wants nausea medication- zofran ordered US Ob Comp Less 14 Wks  09/15/2012   *RADIOLOGY REPORT*  Clinical Data: Abdominal pain.  Evaluate for viability. Gestational age of [redacted] weeks 3 days.  OBSTETRIC <14 WK ULTRASOUND  Technique:  Transabdominal ultrasound was performed for evaluation of the gestation as well as the maternal uterus and adnexal regions.  Comparison:  05/21/2012  Intrauterine gestational sac: Visualized/normal in shape. Yolk sac: Present Embryo: Present Cardiac Activity: Present Heart Rate: 158 bpm  CRL:  16 mm  8 w  1 d         [place] EDC: 04/26/2013  Maternal uterus/Adnexae: No subchorionic hemorrhage.  Normal appearance of the ovaries. No significant free fluid.  IMPRESSION: Intrauterine pregnancy of 8 weeks 1 day with fetal heart rate of 158 beats per minute.   Original Report Authenticated By:  Jeronimo Greaves, M.D.   Assessment and Plan  Single living IUP Depression with anxiety- increase Prozac to 40mg   Nausea and vomiting- prescriptions for phenergan, zofran and DIclegis F/u with OB care ASAP  Alexis Avery 09/15/2012, 11:31 AM

## 2012-09-17 LAB — GC/CHLAMYDIA PROBE AMP: CT Probe RNA: NEGATIVE

## 2012-10-10 ENCOUNTER — Other Ambulatory Visit: Payer: Self-pay | Admitting: Obstetrics & Gynecology

## 2012-10-10 DIAGNOSIS — O3680X0 Pregnancy with inconclusive fetal viability, not applicable or unspecified: Secondary | ICD-10-CM

## 2012-10-12 ENCOUNTER — Ambulatory Visit (INDEPENDENT_AMBULATORY_CARE_PROVIDER_SITE_OTHER): Payer: Medicaid Other

## 2012-10-12 ENCOUNTER — Other Ambulatory Visit: Payer: Self-pay | Admitting: Obstetrics & Gynecology

## 2012-10-12 DIAGNOSIS — O3680X0 Pregnancy with inconclusive fetal viability, not applicable or unspecified: Secondary | ICD-10-CM

## 2012-10-12 DIAGNOSIS — O09299 Supervision of pregnancy with other poor reproductive or obstetric history, unspecified trimester: Secondary | ICD-10-CM

## 2012-10-12 DIAGNOSIS — Z36 Encounter for antenatal screening of mother: Secondary | ICD-10-CM

## 2012-10-12 NOTE — Progress Notes (Signed)
U/S(12+2wks)-single IUP with +FCA noted, cx long and closed, CRL c/w LMP dates, bilateral adnexa wnl,FHR-161 bpm, Pt desires NT/IT screening, NB present, NT-1.86mm

## 2012-10-17 LAB — MATERNAL SCREEN, INTEGRATED #1

## 2012-10-24 ENCOUNTER — Other Ambulatory Visit (HOSPITAL_COMMUNITY)
Admission: RE | Admit: 2012-10-24 | Discharge: 2012-10-24 | Disposition: A | Discharge status: Home / Self Care | Payer: Medicaid Other | Source: Ambulatory Visit | Attending: Adult Health | Admitting: Adult Health

## 2012-10-24 ENCOUNTER — Encounter (INDEPENDENT_AMBULATORY_CARE_PROVIDER_SITE_OTHER): Payer: Self-pay

## 2012-10-24 ENCOUNTER — Encounter: Payer: Self-pay | Admitting: Adult Health

## 2012-10-24 ENCOUNTER — Ambulatory Visit (INDEPENDENT_AMBULATORY_CARE_PROVIDER_SITE_OTHER): Payer: Medicaid Other | Admitting: Adult Health

## 2012-10-24 VITALS — BP 120/64 | Wt 101.0 lb

## 2012-10-24 DIAGNOSIS — Z113 Encounter for screening for infections with a predominantly sexual mode of transmission: Secondary | ICD-10-CM | POA: Insufficient documentation

## 2012-10-24 DIAGNOSIS — F17200 Nicotine dependence, unspecified, uncomplicated: Secondary | ICD-10-CM

## 2012-10-24 DIAGNOSIS — Z3480 Encounter for supervision of other normal pregnancy, unspecified trimester: Secondary | ICD-10-CM

## 2012-10-24 DIAGNOSIS — R87810 Cervical high risk human papillomavirus (HPV) DNA test positive: Secondary | ICD-10-CM | POA: Insufficient documentation

## 2012-10-24 DIAGNOSIS — Z1389 Encounter for screening for other disorder: Secondary | ICD-10-CM

## 2012-10-24 DIAGNOSIS — O99330 Smoking (tobacco) complicating pregnancy, unspecified trimester: Secondary | ICD-10-CM

## 2012-10-24 DIAGNOSIS — F419 Anxiety disorder, unspecified: Secondary | ICD-10-CM

## 2012-10-24 DIAGNOSIS — O09299 Supervision of pregnancy with other poor reproductive or obstetric history, unspecified trimester: Secondary | ICD-10-CM

## 2012-10-24 DIAGNOSIS — Z331 Pregnant state, incidental: Secondary | ICD-10-CM

## 2012-10-24 DIAGNOSIS — O219 Vomiting of pregnancy, unspecified: Secondary | ICD-10-CM

## 2012-10-24 DIAGNOSIS — Z01419 Encounter for gynecological examination (general) (routine) without abnormal findings: Secondary | ICD-10-CM | POA: Insufficient documentation

## 2012-10-24 DIAGNOSIS — Z3490 Encounter for supervision of normal pregnancy, unspecified, unspecified trimester: Secondary | ICD-10-CM

## 2012-10-24 DIAGNOSIS — O99019 Anemia complicating pregnancy, unspecified trimester: Secondary | ICD-10-CM

## 2012-10-24 DIAGNOSIS — Z23 Encounter for immunization: Secondary | ICD-10-CM

## 2012-10-24 DIAGNOSIS — O09219 Supervision of pregnancy with history of pre-term labor, unspecified trimester: Secondary | ICD-10-CM

## 2012-10-24 DIAGNOSIS — Z1151 Encounter for screening for human papillomavirus (HPV): Secondary | ICD-10-CM | POA: Insufficient documentation

## 2012-10-24 HISTORY — DX: Nicotine dependence, unspecified, uncomplicated: F17.200

## 2012-10-24 HISTORY — DX: Vomiting of pregnancy, unspecified: O21.9

## 2012-10-24 LAB — POCT URINALYSIS DIPSTICK: Nitrite, UA: NEGATIVE

## 2012-10-24 LAB — CBC
HCT: 29.1 % — ABNORMAL LOW (ref 36.0–46.0)
Hemoglobin: 10.2 g/dL — ABNORMAL LOW (ref 12.0–15.0)
MCH: 30.1 pg (ref 26.0–34.0)
MCHC: 35.1 g/dL (ref 30.0–36.0)
MCV: 85.8 fL (ref 78.0–100.0)
RDW: 14.6 % (ref 11.5–15.5)

## 2012-10-24 MED ORDER — ONDANSETRON 8 MG PO TBDP: 8.0000 mg | ORAL_TABLET | Freq: Once | ORAL | Status: DC

## 2012-10-24 MED ORDER — INFLUENZA VAC SPLIT QUAD 0.5 ML IM SUSP
0.5000 mL | Freq: Once | INTRAMUSCULAR | Status: AC
  Administered 2012-10-24: 0.5 mL via INTRAMUSCULAR

## 2012-10-24 NOTE — Progress Notes (Signed)
  Subjective:    Alexis Avery is a 32 y.o. [phone] Caucasian female at [redacted]w[redacted]d by LMP being seen today for her first obstetrical visit.  Her obstetrical history is significant for smoker, had 36 week delivery induced for low AFI.  Pregnancy history fully reviewed.   Patient reports nauseamand vomiting will refill zofran.  Filed Vitals:   10/24/12 1549  BP: 120/64  Weight: 101 lb (45.813 kg)    HISTORY: OB History  Gravida Para Term Preterm AB SAB TAB Ectopic Multiple Living  3 1  1 1 1    1     # Outcome Date GA Lbr Len/2nd Weight Sex Delivery Anes PTL Lv  3 CUR           2 PRE           1 SAB              Past Medical History  Diagnosis Date  . Depression   . Preterm labor   . Anxiety   . Pregnant 10/24/2012  . Nausea and vomiting in pregnancy 10/24/2012  . Nicotine addiction 10/24/2012   Past Surgical History  Procedure Laterality Date  . Cholecystectomy    . Tonsillectomy    . Vaginal delivery      X 1   Family History  Problem Relation Age of Onset  . Hypertension Mother   . Cancer Maternal Grandmother     breast  . Hypertension Maternal Uncle   . Heart attack Maternal Uncle      Exam    Pelvic Exam:    Perineum: Normal Perineum   Vulva: normal   Vagina:  normal mucosa, normal discharge, no palpable nodules   Uterus   14 week size     Cervix: normal   Adnexa: Not palpable   Urinary: urethral meatus normal    System:     Skin: normal coloration and turgor, no rashes    Neurologic: oriented, normal mood   Extremities: normal strength, tone, and muscle mass   HEENT PERRLA   Mouth/Teeth mucous membranes moist   Cardiovascular: regular rate and rhythm   Respiratory:  appears well, vitals normal, no respiratory distress, acyanotic, normal RR   Abdomen: soft, non-tender    Thin prep pap smear performed with GC/CHL and high risk HPV screening   Assessment:    Pregnancy: [id number] Patient Active Problem List   Diagnosis Date Noted  .  Pregnant 10/24/2012  . Nausea and vomiting in pregnancy 10/24/2012  . Nicotine addiction 10/24/2012  . Anxiety 10/24/2012      [redacted]w[redacted]d [id number] New OB visit    Plan:     Initial labs drawn Continue prenatal vitamins Problem list reviewed and updated Reviewed n/v relief measures and warning s/s to report Reviewed recommended weight gain based on pre-gravid BMI Encouraged well-balanced diet Genetic Screening discussed Integrated Screen: requested Cystic fibrosis screening discussed requested Ultrasound discussed; fetal survey: requested Follow up in 2 weeks for second IT and see Dr Despina Hidden [facility] done Received flu shot today. [place] Dube 10/24/2012 4:20 PM

## 2012-10-24 NOTE — Addendum Note (Signed)
Addended by: Criss Alvine on: 10/24/2012 04:41 PM   Modules accepted: Orders

## 2012-10-24 NOTE — Patient Instructions (Signed)
Nausea and Vomiting Nausea is a sick feeling that often comes before throwing up (vomiting). Vomiting is a reflex where stomach contents come out of your mouth. Vomiting can cause severe loss of body fluids (dehydration). Children and elderly adults can become dehydrated quickly, especially if they also have diarrhea. Nausea and vomiting are symptoms of a condition or disease. It is important to find the cause of your symptoms. CAUSES   Direct irritation of the stomach lining. This irritation can result from increased acid production (gastroesophageal reflux disease), infection, food poisoning, taking certain medicines (such as nonsteroidal anti-inflammatory drugs), alcohol use, or tobacco use.  Signals from the brain.These signals could be caused by a headache, heat exposure, an inner ear disturbance, increased pressure in the brain from injury, infection, a tumor, or a concussion, pain, emotional stimulus, or metabolic problems.  An obstruction in the gastrointestinal tract (bowel obstruction).  Illnesses such as diabetes, hepatitis, gallbladder problems, appendicitis, kidney problems, cancer, sepsis, atypical symptoms of a heart attack, or eating disorders.  Medical treatments such as chemotherapy and radiation.  Receiving medicine that makes you sleep (general anesthetic) during surgery. DIAGNOSIS Your caregiver may ask for tests to be done if the problems do not improve after a few days. Tests may also be done if symptoms are severe or if the reason for the nausea and vomiting is not clear. Tests may include:  Urine tests.  Blood tests.  Stool tests.  Cultures (to look for evidence of infection).  X-rays or other imaging studies. Test results can help your caregiver make decisions about treatment or the need for additional tests. TREATMENT You need to stay well hydrated. Drink frequently but in small amounts.You may wish to drink water, sports drinks, clear broth, or eat frozen  ice pops or gelatin dessert to help stay hydrated.When you eat, eating slowly may help prevent nausea.There are also some antinausea medicines that may help prevent nausea. HOME CARE INSTRUCTIONS   Take all medicine as directed by your caregiver.  If you do not have an appetite, do not force yourself to eat. However, you must continue to drink fluids.  If you have an appetite, eat a normal diet unless your caregiver tells you differently.  Eat a variety of complex carbohydrates (rice, wheat, potatoes, bread), lean meats, yogurt, fruits, and vegetables.  Avoid high-fat foods because they are more difficult to digest.  Drink enough water and fluids to keep your urine clear or pale yellow.  If you are dehydrated, ask your caregiver for specific rehydration instructions. Signs of dehydration may include:  Severe thirst.  Dry lips and mouth.  Dizziness.  Dark urine.  Decreasing urine frequency and amount.  Confusion.  Rapid breathing or pulse. SEEK IMMEDIATE MEDICAL CARE IF:   You have blood or brown flecks (like coffee grounds) in your vomit.  You have black or bloody stools.  You have a severe headache or stiff neck.  You are confused.  You have severe abdominal pain.  You have chest pain or trouble breathing.  You do not urinate at least once every 8 hours.  You develop cold or clammy skin.  You continue to vomit for longer than 24 to 48 hours.  You have a fever. MAKE SURE YOU:   Understand these instructions.  Will watch your condition.  Will get help right away if you are not doing well or get worse. Document Released: 12/27/2004 Document Revised: 03/21/2011 Document Reviewed: 05/26/2010 [facility] Patient Information 2014 [place], [place]. Pregnancy - Second Trimester The  second trimester of pregnancy (3 to 6 months) is a period of rapid growth for you and your baby. At the end of the sixth month, your baby is about 9 inches long and weighs 1 1/2 pounds.  You will begin to feel the baby move between 18 and 20 weeks of the pregnancy. This is called quickening. Weight gain is faster. A clear fluid (colostrum) may leak out of your breasts. You may feel small contractions of the womb (uterus). This is known as false labor or Braxton-Hicks contractions. This is like a practice for labor when the baby is ready to be born. Usually, the problems with morning sickness have usually passed by the end of your first trimester. Some women develop small dark blotches (called cholasma, mask of pregnancy) on their face that usually goes away after the baby is born. Exposure to the sun makes the blotches worse. Acne may also develop in some pregnant women and pregnant women who have acne, may find that it goes away. PRENATAL EXAMS  Blood work may continue to be done during prenatal exams. These tests are done to check on your health and the probable health of your baby. Blood work is used to follow your blood levels (hemoglobin). Anemia (low hemoglobin) is common during pregnancy. Iron and vitamins are given to help prevent this. You will also be checked for diabetes between 24 and 28 weeks of the pregnancy. Some of the previous blood tests may be repeated.  The size of the uterus is measured during each visit. This is to make sure that the baby is continuing to grow properly according to the dates of the pregnancy.  Your blood pressure is checked every prenatal visit. This is to make sure you are not getting toxemia.  Your urine is checked to make sure you do not have an infection, diabetes or protein in the urine.  Your weight is checked often to make sure gains are happening at the suggested rate. This is to ensure that both you and your baby are growing normally.  Sometimes, an ultrasound is performed to confirm the proper growth and development of the baby. This is a test which bounces harmless sound waves off the baby so your caregiver can more accurately determine  due dates. Sometimes, a test is done on the amniotic fluid surrounding the baby. This test is called an amniocentesis. The amniotic fluid is obtained by sticking a needle into the belly (abdomen). This is done to check the chromosomes in instances where there is a concern about possible genetic problems with the baby. It is also sometimes done near the end of pregnancy if an early delivery is required. In this case, it is done to help make sure the baby's lungs are mature enough for the baby to live outside of the womb. CHANGES OCCURING IN THE SECOND TRIMESTER OF PREGNANCY Your body goes through many changes during pregnancy. They vary from person to person. Talk to your caregiver about changes you notice that you are concerned about.  During the second trimester, you will likely have an increase in your appetite. It is normal to have cravings for certain foods. This varies from person to person and pregnancy to pregnancy.  Your lower abdomen will begin to bulge.  You may have to urinate more often because the uterus and baby are pressing on your bladder. It is also common to get more bladder infections during pregnancy. You can help this by drinking lots of fluids and emptying your bladder  before and after intercourse.  You may begin to get stretch marks on your hips, abdomen, and breasts. These are normal changes in the body during pregnancy. There are no exercises or medicines to take that prevent this change.  You may begin to develop swollen and bulging veins (varicose veins) in your legs. Wearing support hose, elevating your feet for 15 minutes, 3 to 4 times a day and limiting salt in your diet helps lessen the problem.  Heartburn may develop as the uterus grows and pushes up against the stomach. Antacids recommended by your caregiver helps with this problem. Also, eating smaller meals 4 to 5 times a day helps.  Constipation can be treated with a stool softener or adding bulk to your diet.  Drinking lots of fluids, and eating vegetables, fruits, and whole grains are helpful.  Exercising is also helpful. If you have been very active up until your pregnancy, most of these activities can be continued during your pregnancy. If you have been less active, it is helpful to start an exercise program such as walking.  Hemorrhoids may develop at the end of the second trimester. Warm sitz baths and hemorrhoid cream recommended by your caregiver helps hemorrhoid problems.  Backaches may develop during this time of your pregnancy. Avoid heavy lifting, wear low heal shoes, and practice good posture to help with backache problems.  Some pregnant women develop tingling and numbness of their hand and fingers because of swelling and tightening of ligaments in the wrist (carpel tunnel syndrome). This goes away after the baby is born.  As your breasts enlarge, you may have to get a bigger bra. Get a comfortable, cotton, support bra. Do not get a nursing bra until the last month of the pregnancy if you will be nursing the baby.  You may get a dark line from your belly button to the pubic area called the linea nigra.  You may develop rosy cheeks because of increase blood flow to the face.  You may develop spider looking lines of the face, neck, arms, and chest. These go away after the baby is born. HOME CARE INSTRUCTIONS   It is extremely important to avoid all smoking, herbs, alcohol, and unprescribed drugs during your pregnancy. These chemicals affect the formation and growth of the baby. Avoid these chemicals throughout the pregnancy to ensure the delivery of a healthy infant.  Most of your home care instructions are the same as suggested for the first trimester of your pregnancy. Keep your caregiver's appointments. Follow your caregiver's instructions regarding medicine use, exercise, and diet.  During pregnancy, you are providing food for you and your baby. Continue to eat regular, well-balanced  meals. Choose foods such as meat, fish, milk and other low fat dairy products, vegetables, fruits, and whole-grain breads and cereals. Your caregiver will tell you of the ideal weight gain.  A physical sexual relationship may be continued up until near the end of pregnancy if there are no other problems. Problems could include early (premature) leaking of amniotic fluid from the membranes, vaginal bleeding, abdominal pain, or other medical or pregnancy problems.  Exercise regularly if there are no restrictions. Check with your caregiver if you are unsure of the safety of some of your exercises. The greatest weight gain will occur in the last 2 trimesters of pregnancy. Exercise will help you:  Control your weight.  Get you in shape for labor and delivery.  Lose weight after you have the baby.  Wear a good support or  jogging bra for breast tenderness during pregnancy. This may help if worn during sleep. Pads or tissues may be used in the bra if you are leaking colostrum.  Do not use hot tubs, steam rooms or saunas throughout the pregnancy.  Wear your seat belt at all times when driving. This protects you and your baby if you are in an accident.  Avoid raw meat, uncooked cheese, cat litter boxes, and soil used by cats. These carry germs that can cause birth defects in the baby.  The second trimester is also a good time to visit your dentist for your dental health if this has not been done yet. Getting your teeth cleaned is okay. Use a soft toothbrush. Brush gently during pregnancy.  It is easier to leak urine during pregnancy. Tightening up and strengthening the pelvic muscles will help with this problem. Practice stopping your urination while you are going to the bathroom. These are the same muscles you need to strengthen. It is also the muscles you would use as if you were trying to stop from passing gas. You can practice tightening these muscles up 10 times a set and repeating this about 3  times per day. Once you know what muscles to tighten up, do not perform these exercises during urination. It is more likely to contribute to an infection by backing up the urine.  Ask for help if you have financial, counseling, or nutritional needs during pregnancy. Your caregiver will be able to offer counseling for these needs as well as refer you for other special needs.  Your skin may become oily. If so, wash your face with mild soap, use non-greasy moisturizer and oil or cream based makeup. MEDICINES AND DRUG USE IN PREGNANCY  Take prenatal vitamins as directed. The vitamin should contain 1 milligram of folic acid. Keep all vitamins out of reach of children. Only a couple vitamins or tablets containing iron may be fatal to a baby or young child when ingested.  Avoid use of all medicines, including herbs, over-the-counter medicines, not prescribed or suggested by your caregiver. Only take over-the-counter or prescription medicines for pain, discomfort, or fever as directed by your caregiver. Do not use aspirin.  Let your caregiver also know about herbs you may be using.  Alcohol is related to a number of birth defects. This includes fetal alcohol syndrome. All alcohol, in any form, should be avoided completely. Smoking will cause low birth rate and premature babies.  Street or illegal drugs are very harmful to the baby. They are absolutely forbidden. A baby born to an addicted mother will be addicted at birth. The baby will go through the same withdrawal an adult does. SEEK MEDICAL CARE IF:  You have any concerns or worries during your pregnancy. It is better to call with your questions if you feel they cannot wait, rather than worry about them. SEEK IMMEDIATE MEDICAL CARE IF:   An unexplained oral temperature above 102 F (38.9 C) develops, or as your caregiver suggests.  You have leaking of fluid from the vagina (birth canal). If leaking membranes are suspected, take your temperature and  tell your caregiver of this when you call.  There is vaginal spotting, bleeding, or passing clots. Tell your caregiver of the amount and how many pads are used. Light spotting in pregnancy is common, especially following intercourse.  You develop a bad smelling vaginal discharge with a change in the color from clear to white.  You continue to feel sick to your stomach (  nauseated) and have no relief from remedies suggested. You vomit blood or coffee ground-like materials.  You lose more than 2 pounds of weight or gain more than 2 pounds of weight over 1 week, or as suggested by your caregiver.  You notice swelling of your face, hands, feet, or legs.  You get exposed to [place] measles and have never had them.  You are exposed to fifth disease or chickenpox.  You develop belly (abdominal) pain. Round ligament discomfort is a common non-cancerous (benign) cause of abdominal pain in pregnancy. Your caregiver still must evaluate you.  You develop a bad headache that does not go away.  You develop fever, diarrhea, pain with urination, or shortness of breath.  You develop visual problems, blurry, or double vision.  You fall or are in a car accident or any kind of trauma.  There is mental or physical violence at home. Document Released: 12/21/2000 Document Revised: 09/21/2011 Document Reviewed: 06/25/2008 [facility] Patient Information 2014 [place], [place]. Follow up in 2 weeks

## 2012-10-24 NOTE — Progress Notes (Signed)
C/o mid to lower back pain, needs refill on Zofran

## 2012-10-25 ENCOUNTER — Other Ambulatory Visit: Payer: Self-pay | Admitting: *Deleted

## 2012-10-25 LAB — URINALYSIS, ROUTINE W REFLEX MICROSCOPIC
Glucose, UA: NEGATIVE mg/dL
Leukocytes, UA: NEGATIVE
Protein, ur: NEGATIVE mg/dL
Specific Gravity, Urine: 1.025 (ref 1.005–1.030)
pH: 6 (ref 5.0–8.0)

## 2012-10-25 LAB — DRUG SCREEN, URINE, NO CONFIRMATION
Opiate Screen, Urine: NEGATIVE
Phencyclidine (PCP): NEGATIVE
Propoxyphene: NEGATIVE

## 2012-10-25 LAB — TSH: TSH: 2.807 u[IU]/mL (ref 0.350–4.500)

## 2012-10-25 LAB — RUBELLA SCREEN: Rubella: 9.38 Index — ABNORMAL HIGH (ref <0.90)

## 2012-10-25 LAB — ABO AND RH

## 2012-10-26 LAB — CYSTIC FIBROSIS DIAGNOSTIC STUDY

## 2012-10-26 LAB — URINE CULTURE: Colony Count: 7000

## 2012-10-26 MED ORDER — ONDANSETRON 8 MG PO TBDP: 8.0000 mg | ORAL_TABLET | Freq: Three times a day (TID) | ORAL | Status: DC | PRN

## 2012-11-01 ENCOUNTER — Telehealth: Payer: Self-pay | Admitting: Adult Health

## 2012-11-01 NOTE — Telephone Encounter (Signed)
Left message to call about pap 

## 2012-11-05 ENCOUNTER — Telehealth: Payer: Self-pay | Admitting: Adult Health

## 2012-11-05 NOTE — Telephone Encounter (Signed)
November returned call and she is aware of pap results

## 2012-11-08 ENCOUNTER — Encounter: Payer: Self-pay | Admitting: Obstetrics & Gynecology

## 2012-11-08 ENCOUNTER — Encounter (INDEPENDENT_AMBULATORY_CARE_PROVIDER_SITE_OTHER): Payer: Self-pay

## 2012-11-08 ENCOUNTER — Other Ambulatory Visit: Payer: Self-pay | Admitting: Obstetrics & Gynecology

## 2012-11-08 ENCOUNTER — Ambulatory Visit (INDEPENDENT_AMBULATORY_CARE_PROVIDER_SITE_OTHER): Payer: Medicaid Other | Admitting: Obstetrics & Gynecology

## 2012-11-08 VITALS — BP 100/52 | Wt 103.5 lb

## 2012-11-08 DIAGNOSIS — O99330 Smoking (tobacco) complicating pregnancy, unspecified trimester: Secondary | ICD-10-CM

## 2012-11-08 DIAGNOSIS — Z331 Pregnant state, incidental: Secondary | ICD-10-CM

## 2012-11-08 DIAGNOSIS — Z1389 Encounter for screening for other disorder: Secondary | ICD-10-CM

## 2012-11-08 DIAGNOSIS — O09219 Supervision of pregnancy with history of pre-term labor, unspecified trimester: Secondary | ICD-10-CM

## 2012-11-08 DIAGNOSIS — O09299 Supervision of pregnancy with other poor reproductive or obstetric history, unspecified trimester: Secondary | ICD-10-CM

## 2012-11-08 DIAGNOSIS — O99019 Anemia complicating pregnancy, unspecified trimester: Secondary | ICD-10-CM

## 2012-11-08 LAB — POCT URINALYSIS DIPSTICK
Blood, UA: NEGATIVE
Glucose, UA: NEGATIVE
Ketones, UA: NEGATIVE
Leukocytes, UA: NEGATIVE
Nitrite, UA: NEGATIVE
Protein, UA: NEGATIVE

## 2012-11-08 NOTE — Progress Notes (Signed)
BP weight and urine results all reviewed and noted. Patient reports good fetal movement, denies any bleeding and no rupture of membranes symptoms or regular contractions. Patient is without complaints. All questions were answered.  

## 2012-11-15 LAB — MATERNAL SCREEN, INTEGRATED #2
AFP MoM: 1.2
AFP, Serum: 50.5 ng/mL
Crown Rump Length: 53.5 mm
Estriol Mom: 0.68
Estriol, Free: 0.66 ng/mL
MSS Down Syndrome: 1:5000 {titer}
Nuchal Translucency: 1.36 mm
hCG MoM: 1.11
hCG, Serum: 46 IU/mL

## 2012-12-05 ENCOUNTER — Ambulatory Visit (INDEPENDENT_AMBULATORY_CARE_PROVIDER_SITE_OTHER): Payer: Medicaid Other

## 2012-12-05 ENCOUNTER — Encounter: Payer: Self-pay | Admitting: Obstetrics and Gynecology

## 2012-12-05 ENCOUNTER — Other Ambulatory Visit: Payer: Self-pay | Admitting: Obstetrics & Gynecology

## 2012-12-05 ENCOUNTER — Encounter (INDEPENDENT_AMBULATORY_CARE_PROVIDER_SITE_OTHER): Payer: Self-pay

## 2012-12-05 ENCOUNTER — Ambulatory Visit (INDEPENDENT_AMBULATORY_CARE_PROVIDER_SITE_OTHER): Payer: Medicaid Other | Admitting: Obstetrics and Gynecology

## 2012-12-05 VITALS — BP 90/48 | Wt 104.6 lb

## 2012-12-05 DIAGNOSIS — O99019 Anemia complicating pregnancy, unspecified trimester: Secondary | ICD-10-CM

## 2012-12-05 DIAGNOSIS — F1920 Other psychoactive substance dependence, uncomplicated: Secondary | ICD-10-CM

## 2012-12-05 DIAGNOSIS — O09299 Supervision of pregnancy with other poor reproductive or obstetric history, unspecified trimester: Secondary | ICD-10-CM

## 2012-12-05 DIAGNOSIS — Z331 Pregnant state, incidental: Secondary | ICD-10-CM

## 2012-12-05 DIAGNOSIS — Z1389 Encounter for screening for other disorder: Secondary | ICD-10-CM

## 2012-12-05 DIAGNOSIS — Z3492 Encounter for supervision of normal pregnancy, unspecified, second trimester: Secondary | ICD-10-CM

## 2012-12-05 DIAGNOSIS — O09219 Supervision of pregnancy with history of pre-term labor, unspecified trimester: Secondary | ICD-10-CM

## 2012-12-05 DIAGNOSIS — O99330 Smoking (tobacco) complicating pregnancy, unspecified trimester: Secondary | ICD-10-CM

## 2012-12-05 LAB — POCT URINALYSIS DIPSTICK
Glucose, UA: NEGATIVE
Ketones, UA: NEGATIVE
Leukocytes, UA: NEGATIVE
Protein, UA: NEGATIVE

## 2012-12-05 NOTE — Progress Notes (Signed)
U/S(20+0wks)-active fetus, meas c/w dates, fluid wnl, no major abnl noted, cx long and closed (3.3cm), bilateral adnexa wnl, female fetus, anterior Gr 0 placenta, FHR-133 bpm

## 2012-12-05 NOTE — Progress Notes (Signed)
DISCUSSED pos uds's. Pt anxiety currently mild. Strategies discussed

## 2012-12-05 NOTE — Patient Instructions (Signed)
Please put up a Thanksgiving note.

## 2013-01-01 ENCOUNTER — Encounter: Payer: Medicaid Other | Admitting: Advanced Practice Midwife

## 2013-01-10 NOTE — L&D Delivery Note (Signed)
Attestation of Attending Supervision of Advanced Practitioner (PA/CNM/NP): Evaluation and management procedures were performed by the Advanced Practitioner under my supervision and collaboration.  I have reviewed the Advanced Practitioner's note and chart, and I agree with the management and plan.  Yaman Grauberger, DO Attending Physician Faculty Practice, [facility]  

## 2013-01-10 NOTE — L&D Delivery Note (Signed)
Delivery Note At 5:16 PM a viable female was delivered via Vaginal, Spontaneous Delivery (Presentation: OA).  APGAR: 7, 9; weight: 6 lb 6 oz.   Placenta status: intact. Cord: 3 vessels with the following complications: None.  Anesthesia: Epidural  Episiotomy: None Lacerations: 2nd degree;Perineal Suture Repair: 3.0 vicryl Est. Blood Loss (mL): 350  Mom to postpartum.  Baby to Couplet care / Skin to Skin.  Upon arrival patient was complete, pushing with good maternal effort to deliver a healthy baby boy. Baby delivered without difficulty, was noted to have good tone and place on maternal abdomen for oral suctioning, drying and stimulation. Cord clamped and cut by FOB. Baby with some pink color change and weak cry, delayed transition, taken to warmer in L&D room for continued stimulation, drying, and suction (DeLee suction 2mL clear fluid), initial poor respiratory effort and weak cry, improved with continued stim and blow by O2 (O2 sat up to >92%). Baby taken to nursery accompanied by FOB. Placenta release delayed, repaired 2nd degree perineal lac, placenta delivered following repair, hemostasis achieved with uterine massage. Counts of sharps, instruments, and lap pads were all correct.    Saralyn Pilarlexander Karamalegos 04/18/2013, 5:45 PM  I was present for this delivery and agree with the above resident's note.  LEFTWICH-KIRBY, Yenni Carra Certified Nurse-Midwife

## 2013-01-16 ENCOUNTER — Encounter: Payer: Self-pay | Admitting: Women's Health

## 2013-01-16 ENCOUNTER — Ambulatory Visit (INDEPENDENT_AMBULATORY_CARE_PROVIDER_SITE_OTHER): Payer: Medicaid Other | Admitting: Women's Health

## 2013-01-16 VITALS — BP 100/60 | Wt 109.0 lb

## 2013-01-16 DIAGNOSIS — O99019 Anemia complicating pregnancy, unspecified trimester: Secondary | ICD-10-CM

## 2013-01-16 DIAGNOSIS — Z3492 Encounter for supervision of normal pregnancy, unspecified, second trimester: Secondary | ICD-10-CM

## 2013-01-16 DIAGNOSIS — O09899 Supervision of other high risk pregnancies, unspecified trimester: Secondary | ICD-10-CM | POA: Insufficient documentation

## 2013-01-16 DIAGNOSIS — F1920 Other psychoactive substance dependence, uncomplicated: Secondary | ICD-10-CM

## 2013-01-16 DIAGNOSIS — O219 Vomiting of pregnancy, unspecified: Secondary | ICD-10-CM

## 2013-01-16 DIAGNOSIS — O99322 Drug use complicating pregnancy, second trimester: Secondary | ICD-10-CM

## 2013-01-16 DIAGNOSIS — Z1389 Encounter for screening for other disorder: Secondary | ICD-10-CM

## 2013-01-16 DIAGNOSIS — O99320 Drug use complicating pregnancy, unspecified trimester: Secondary | ICD-10-CM | POA: Insufficient documentation

## 2013-01-16 DIAGNOSIS — O09219 Supervision of pregnancy with history of pre-term labor, unspecified trimester: Secondary | ICD-10-CM

## 2013-01-16 DIAGNOSIS — Z331 Pregnant state, incidental: Secondary | ICD-10-CM

## 2013-01-16 LAB — POCT URINALYSIS DIPSTICK
GLUCOSE UA: NEGATIVE
Ketones, UA: NEGATIVE
Leukocytes, UA: NEGATIVE
Nitrite, UA: NEGATIVE
Protein, UA: NEGATIVE
RBC UA: NEGATIVE

## 2013-01-16 MED ORDER — ONDANSETRON 8 MG PO TBDP: 8.0000 mg | ORAL_TABLET | Freq: Three times a day (TID) | ORAL | Status: DC | PRN

## 2013-01-16 NOTE — Progress Notes (Signed)
Reports good fm. Denies uc's, lof, vb, urinary frequency, urgency, hesitancy, or dysuria.  Continued n/v, out of zofran and phenergan, medicaid is still pending. Would like refill on zofran and to try samples of diclegis- no samples available today, can come back tomorrow to get some.   Reviewed ptl s/s, fm.  All questions answered. F/U in 2wks for pn2 and visit.

## 2013-01-16 NOTE — Patient Instructions (Signed)
You will have your sugar test next visit.  Please do not eat or drink anything after midnight the night before you come, not even water.  You will be here for at least two hours.     [place] [facility]:  [facility] [phone]            [facility] [phone]                 [facility] [phone] (usually doesn't accept new patients unless you have family there already, you are always welcome to call and ask)             [facility] ([address] [place]) [phone]   [place] [facility]:   [facility]: [phone]  [facility]: [phone]    Second Trimester of Pregnancy The second trimester is from week 13 through week 28, months 4 through 6. The second trimester is often a time when you feel your best. Your body has also adjusted to being pregnant, and you begin to feel better physically. Usually, morning sickness has lessened or quit completely, you may have more energy, and you may have an increase in appetite. The second trimester is also a time when the fetus is growing rapidly. At the end of the sixth month, the fetus is about 9 inches long and weighs about 1 pounds. You will likely begin to feel the baby move (quickening) between 18 and 20 weeks of the pregnancy. BODY CHANGES Your body goes through many changes during pregnancy. The changes vary from woman to woman.   Your weight will continue to increase. You will notice your lower abdomen bulging out.  You may begin to get stretch marks on your hips, abdomen, and breasts.  You may develop headaches that can be relieved by medicines approved by your caregiver.  You may urinate more often because the fetus is pressing on your bladder.  You may develop or continue to have heartburn as a result of your pregnancy.  You may develop constipation because certain hormones are causing the muscles that push  waste through your intestines to slow down.  You may develop hemorrhoids or swollen, bulging veins (varicose veins).  You may have back pain because of the weight gain and pregnancy hormones relaxing your joints between the bones in your pelvis and as a result of a shift in weight and the muscles that support your balance.  Your breasts will continue to grow and be tender.  Your gums may bleed and may be sensitive to brushing and flossing.  Dark spots or blotches (chloasma, mask of pregnancy) may develop on your face. This will likely fade after the baby is born.  A dark line from your belly button to the pubic area (linea nigra) may appear. This will likely fade after the baby is born. WHAT TO EXPECT AT YOUR PRENATAL VISITS During a routine prenatal visit:  You will be weighed to make sure you and the fetus are growing normally.  Your blood pressure will be taken.  Your abdomen will be measured to track your baby's growth.  The fetal heartbeat will be listened to.  Any test results from the previous visit will be discussed. Your caregiver may ask you:  How you are feeling.  If you are feeling the baby move.  If you have had any abnormal symptoms, such as leaking fluid, bleeding, severe headaches, or abdominal cramping.  If you have any questions. Other tests   that may be performed during your second trimester include:  Blood tests that check for:  Low iron levels (anemia).  Gestational diabetes (between 24 and 28 weeks).  Rh antibodies.  Urine tests to check for infections, diabetes, or protein in the urine.  An ultrasound to confirm the proper growth and development of the baby.  An amniocentesis to check for possible genetic problems.  Fetal screens for spina bifida and Down syndrome. HOME CARE INSTRUCTIONS   Avoid all smoking, herbs, alcohol, and unprescribed drugs. These chemicals affect the formation and growth of the baby.  Follow your caregiver's  instructions regarding medicine use. There are medicines that are either safe or unsafe to take during pregnancy.  Exercise only as directed by your caregiver. Experiencing uterine cramps is a good sign to stop exercising.  Continue to eat regular, healthy meals.  Wear a good support bra for breast tenderness.  Do not use hot tubs, steam rooms, or saunas.  Wear your seat belt at all times when driving.  Avoid raw meat, uncooked cheese, cat litter boxes, and soil used by cats. These carry germs that can cause birth defects in the baby.  Take your prenatal vitamins.  Try taking a stool softener (if your caregiver approves) if you develop constipation. Eat more high-fiber foods, such as fresh vegetables or fruit and whole grains. Drink plenty of fluids to keep your urine clear or pale yellow.  Take warm sitz baths to soothe any pain or discomfort caused by hemorrhoids. Use hemorrhoid cream if your caregiver approves.  If you develop varicose veins, wear support hose. Elevate your feet for 15 minutes, 3 4 times a day. Limit salt in your diet.  Avoid heavy lifting, wear low heel shoes, and practice good posture.  Rest with your legs elevated if you have leg cramps or low back pain.  Visit your dentist if you have not gone yet during your pregnancy. Use a soft toothbrush to brush your teeth and be gentle when you floss.  A sexual relationship may be continued unless your caregiver directs you otherwise.  Continue to go to all your prenatal visits as directed by your caregiver. SEEK MEDICAL CARE IF:   You have dizziness.  You have mild pelvic cramps, pelvic pressure, or nagging pain in the abdominal area.  You have persistent nausea, vomiting, or diarrhea.  You have a bad smelling vaginal discharge.  You have pain with urination. SEEK IMMEDIATE MEDICAL CARE IF:   You have a fever.  You are leaking fluid from your vagina.  You have spotting or bleeding from your vagina.  You  have severe abdominal cramping or pain.  You have rapid weight gain or loss.  You have shortness of breath with chest pain.  You notice sudden or extreme swelling of your face, hands, ankles, feet, or legs.  You have not felt your baby move in over an hour.  You have severe headaches that do not go away with medicine.  You have vision changes. Document Released: 12/21/2000 Document Revised: 08/29/2012 Document Reviewed: 02/28/2012 [employer] Patient Information 2014 [employer].  

## 2013-01-17 LAB — DRUG SCREEN, URINE, NO CONFIRMATION
Amphetamine Screen, Ur: NEGATIVE
Barbiturate Quant, Ur: NEGATIVE
Benzodiazepines.: NEGATIVE
Cocaine Metabolites: NEGATIVE
Creatinine,U: 48.6 mg/dL
MARIJUANA METABOLITE: NEGATIVE
Methadone: NEGATIVE
OPIATE SCREEN, URINE: NEGATIVE
PHENCYCLIDINE (PCP): NEGATIVE
PROPOXYPHENE: NEGATIVE

## 2013-01-17 LAB — OXYCODONE SCREEN, UA, RFLX CONFIRM: Oxycodone Screen, Ur: NEGATIVE ng/mL

## 2013-01-21 ENCOUNTER — Encounter: Payer: Self-pay | Admitting: Women's Health

## 2013-01-30 ENCOUNTER — Other Ambulatory Visit: Payer: Self-pay

## 2013-01-30 ENCOUNTER — Encounter: Payer: Self-pay | Admitting: Women's Health

## 2013-02-06 ENCOUNTER — Telehealth: Payer: Self-pay | Admitting: Obstetrics and Gynecology

## 2013-02-06 NOTE — Telephone Encounter (Signed)
Pt requesting refill on prozac. No longer is seen by provider who prescribed prozac before pregnancy.

## 2013-02-06 NOTE — Telephone Encounter (Signed)
Pt requesting refill on prozac

## 2013-02-07 NOTE — Telephone Encounter (Signed)
Pt needs to be seen to discuss options

## 2013-02-11 ENCOUNTER — Other Ambulatory Visit: Payer: Self-pay

## 2013-02-11 ENCOUNTER — Encounter: Payer: Self-pay | Admitting: Obstetrics and Gynecology

## 2013-02-14 ENCOUNTER — Other Ambulatory Visit: Payer: Medicaid Other

## 2013-02-14 ENCOUNTER — Ambulatory Visit (INDEPENDENT_AMBULATORY_CARE_PROVIDER_SITE_OTHER): Payer: Medicaid Other | Admitting: Advanced Practice Midwife

## 2013-02-14 ENCOUNTER — Encounter: Payer: Self-pay | Admitting: Advanced Practice Midwife

## 2013-02-14 VITALS — BP 84/60 | Wt 116.0 lb

## 2013-02-14 DIAGNOSIS — O99019 Anemia complicating pregnancy, unspecified trimester: Secondary | ICD-10-CM

## 2013-02-14 DIAGNOSIS — Z3480 Encounter for supervision of other normal pregnancy, unspecified trimester: Secondary | ICD-10-CM

## 2013-02-14 DIAGNOSIS — Z331 Pregnant state, incidental: Secondary | ICD-10-CM

## 2013-02-14 DIAGNOSIS — O24410 Gestational diabetes mellitus in pregnancy, diet controlled: Secondary | ICD-10-CM

## 2013-02-14 DIAGNOSIS — Z1389 Encounter for screening for other disorder: Secondary | ICD-10-CM

## 2013-02-14 DIAGNOSIS — O09219 Supervision of pregnancy with history of pre-term labor, unspecified trimester: Secondary | ICD-10-CM

## 2013-02-14 LAB — CBC
HEMATOCRIT: 27.8 % — AB (ref 36.0–46.0)
HEMOGLOBIN: 9.5 g/dL — AB (ref 12.0–15.0)
MCH: 30.9 pg (ref 26.0–34.0)
MCHC: 34.2 g/dL (ref 30.0–36.0)
MCV: 90.6 fL (ref 78.0–100.0)
PLATELETS: 193 10*3/uL (ref 150–400)
RBC: 3.07 MIL/uL — AB (ref 3.87–5.11)
RDW: 13.6 % (ref 11.5–15.5)
WBC: 12 10*3/uL — ABNORMAL HIGH (ref 4.0–10.5)

## 2013-02-14 LAB — POCT URINALYSIS DIPSTICK
Glucose, UA: NEGATIVE
Ketones, UA: NEGATIVE
Leukocytes, UA: NEGATIVE
NITRITE UA: NEGATIVE
Protein, UA: NEGATIVE

## 2013-02-14 NOTE — Progress Notes (Signed)
Doing Pn2 today.    No c/o at this time.  Routine questions about pregnancy answered.  F/U in 2 weeks for LROB.

## 2013-02-15 ENCOUNTER — Other Ambulatory Visit: Payer: Self-pay | Admitting: *Deleted

## 2013-02-15 LAB — ANTIBODY SCREEN: ANTIBODY SCREEN: NEGATIVE

## 2013-02-15 LAB — GLUCOSE TOLERANCE, 2 HOURS W/ 1HR
GLUCOSE, FASTING: 73 mg/dL (ref 70–99)
GLUCOSE: 151 mg/dL (ref 70–170)
Glucose, 2 hour: 160 mg/dL — ABNORMAL HIGH (ref 70–139)

## 2013-02-15 LAB — HSV 2 ANTIBODY, IGG: HSV 2 Glycoprotein G Ab, IgG: 0.19 IV

## 2013-02-15 LAB — HIV ANTIBODY (ROUTINE TESTING W REFLEX): HIV: NONREACTIVE

## 2013-02-15 LAB — RPR

## 2013-02-18 NOTE — Telephone Encounter (Signed)
Spoke with pt and advised her that she would need to make an appointment to discuss Prozac with Dr. Despina HiddenEure per Selena BattenKim. Pt verbalized understanding. P twas switched up to front office to make that appointment.

## 2013-02-19 ENCOUNTER — Encounter: Payer: Self-pay | Admitting: Advanced Practice Midwife

## 2013-02-19 ENCOUNTER — Other Ambulatory Visit: Payer: Self-pay | Admitting: Advanced Practice Midwife

## 2013-02-19 DIAGNOSIS — O24410 Gestational diabetes mellitus in pregnancy, diet controlled: Secondary | ICD-10-CM

## 2013-02-19 MED ORDER — FERROUS SULFATE 325 (65 FE) MG PO TABS: 325.0000 mg | ORAL_TABLET | Freq: Every day | ORAL | Status: DC

## 2013-02-19 NOTE — Progress Notes (Signed)
Pt left message to call for test results:  A1DM and anemia (FeSO4 rx sent). Referral to dietician sent. CRESENZO-DISHMAN,Candise Crabtree

## 2013-02-20 ENCOUNTER — Encounter: Payer: Medicaid Other | Attending: Advanced Practice Midwife

## 2013-02-20 VITALS — Ht <= 58 in | Wt 117.3 lb

## 2013-02-20 DIAGNOSIS — O24410 Gestational diabetes mellitus in pregnancy, diet controlled: Secondary | ICD-10-CM

## 2013-02-20 DIAGNOSIS — O99810 Abnormal glucose complicating pregnancy: Secondary | ICD-10-CM | POA: Insufficient documentation

## 2013-02-21 ENCOUNTER — Telehealth: Payer: Self-pay | Admitting: Obstetrics & Gynecology

## 2013-02-21 NOTE — Progress Notes (Signed)
  Patient was seen on 02/20/13 for Gestational Diabetes self-management class at the [facility]. The following learning objectives were met by the patient during this course:   States the definition of Gestational Diabetes  States why dietary management is important in controlling blood glucose  Describes the effects of carbohydrates on blood glucose levels  Demonstrates ability to create a balanced meal plan  Demonstrates carbohydrate counting   States when to check blood glucose levels  Demonstrates proper blood glucose monitoring techniques  States the effect of stress and exercise on blood glucose levels  States the importance of limiting caffeine and abstaining from alcohol and smoking  Plan:  Aim for 2 Carb Choices per meal (30 grams) +/- 1 either way for breakfast Aim for 3 Carb Choices per meal (45 grams) +/- 1 either way from lunch and dinner Aim for 1-2 Carbs per snack Begin reading food labels for Total Carbohydrate and sugar grams of foods Consider  increasing your activity level by walking daily as tolerated Begin checking BG before breakfast and 1-2 hours after first bit of breakfast, lunch and dinner after  as directed by MD  Take medication  as directed by MD  Blood glucose monitor given: Accu Chek Nano BG Monitoring Kit Lot # [id number] Exp: 02-09-13 Blood glucose reading: 106  Patient instructed to monitor glucose levels: FBS: 60 - <90 2 hour: <120  Patient received the following handouts:  Nutrition Diabetes and Pregnancy  Carbohydrate Counting List  Meal Planning worksheet  Patient will be seen for follow-up as needed.

## 2013-02-21 NOTE — Telephone Encounter (Signed)
Spoke with pt. Pt went to diabetes class last pm and needs test strips and lancets. Has the Accucheck Nano machine. Pt needs the fast click lancets. Checks sugar QID. Spoke with Drenda FreezeFran, CNM. She authorized prn refills. [facility] [place] Pt voiced understanding. JSY

## 2013-02-27 ENCOUNTER — Encounter: Payer: Self-pay | Admitting: Obstetrics & Gynecology

## 2013-02-27 ENCOUNTER — Ambulatory Visit (INDEPENDENT_AMBULATORY_CARE_PROVIDER_SITE_OTHER): Payer: Medicaid Other | Admitting: Obstetrics & Gynecology

## 2013-02-27 VITALS — BP 90/60 | Wt 115.0 lb

## 2013-02-27 DIAGNOSIS — O09219 Supervision of pregnancy with history of pre-term labor, unspecified trimester: Secondary | ICD-10-CM

## 2013-02-27 DIAGNOSIS — Z1389 Encounter for screening for other disorder: Secondary | ICD-10-CM

## 2013-02-27 DIAGNOSIS — O99019 Anemia complicating pregnancy, unspecified trimester: Secondary | ICD-10-CM

## 2013-02-27 DIAGNOSIS — Z331 Pregnant state, incidental: Secondary | ICD-10-CM

## 2013-02-27 DIAGNOSIS — O99810 Abnormal glucose complicating pregnancy: Secondary | ICD-10-CM

## 2013-02-27 DIAGNOSIS — Z3493 Encounter for supervision of normal pregnancy, unspecified, third trimester: Secondary | ICD-10-CM

## 2013-02-27 LAB — POCT URINALYSIS DIPSTICK
Glucose, UA: NEGATIVE
KETONES UA: NEGATIVE
Leukocytes, UA: NEGATIVE
Nitrite, UA: NEGATIVE
Protein, UA: NEGATIVE
RBC UA: NEGATIVE

## 2013-02-27 NOTE — Progress Notes (Signed)
FBS 90-114, mostly in the 90's  Eats ice cream at night.  Advised to try [employer] low carb option.  All PP in the 90's.  C/O hips hurting at night.  Try pillow b/t the knees.  F/U 2 week Low-risk ob appt

## 2013-02-27 NOTE — Addendum Note (Signed)
Addended by: Richardson ChiquitoRAVIS, ASHLEY M on: 02/27/2013 03:05 PM   Modules accepted: Orders

## 2013-03-13 ENCOUNTER — Other Ambulatory Visit: Payer: Self-pay | Admitting: *Deleted

## 2013-03-13 ENCOUNTER — Encounter: Payer: Medicaid Other | Admitting: Obstetrics & Gynecology

## 2013-03-14 ENCOUNTER — Encounter: Payer: Self-pay | Admitting: Obstetrics & Gynecology

## 2013-03-14 ENCOUNTER — Ambulatory Visit (INDEPENDENT_AMBULATORY_CARE_PROVIDER_SITE_OTHER): Payer: Medicaid Other | Admitting: Obstetrics & Gynecology

## 2013-03-14 VITALS — BP 90/60 | Wt 118.0 lb

## 2013-03-14 DIAGNOSIS — O99019 Anemia complicating pregnancy, unspecified trimester: Secondary | ICD-10-CM

## 2013-03-14 DIAGNOSIS — Z331 Pregnant state, incidental: Secondary | ICD-10-CM

## 2013-03-14 DIAGNOSIS — Z1389 Encounter for screening for other disorder: Secondary | ICD-10-CM

## 2013-03-14 DIAGNOSIS — O99810 Abnormal glucose complicating pregnancy: Secondary | ICD-10-CM

## 2013-03-14 DIAGNOSIS — O09219 Supervision of pregnancy with history of pre-term labor, unspecified trimester: Secondary | ICD-10-CM

## 2013-03-14 LAB — POCT URINALYSIS DIPSTICK
Blood, UA: NEGATIVE
GLUCOSE UA: NEGATIVE
KETONES UA: NEGATIVE
LEUKOCYTES UA: NEGATIVE
Nitrite, UA: NEGATIVE
Protein, UA: NEGATIVE

## 2013-03-14 NOTE — Progress Notes (Signed)
Blood sugars are borderline, some in the 140s if remains elevated add glyburide 2.5 BID BP weight and urine results all reviewed and noted. Patient reports good fetal movement, denies any bleeding and no rupture of membranes symptoms or regular contractions. Patient is without complaints. All questions were answered.

## 2013-03-21 ENCOUNTER — Encounter: Payer: Self-pay | Admitting: Obstetrics & Gynecology

## 2013-03-21 ENCOUNTER — Ambulatory Visit (INDEPENDENT_AMBULATORY_CARE_PROVIDER_SITE_OTHER): Payer: Medicaid Other | Admitting: Obstetrics & Gynecology

## 2013-03-21 VITALS — BP 100/60 | Wt 118.0 lb

## 2013-03-21 DIAGNOSIS — Z1389 Encounter for screening for other disorder: Secondary | ICD-10-CM

## 2013-03-21 DIAGNOSIS — O09219 Supervision of pregnancy with history of pre-term labor, unspecified trimester: Secondary | ICD-10-CM

## 2013-03-21 DIAGNOSIS — O99019 Anemia complicating pregnancy, unspecified trimester: Secondary | ICD-10-CM

## 2013-03-21 DIAGNOSIS — O99810 Abnormal glucose complicating pregnancy: Secondary | ICD-10-CM

## 2013-03-21 DIAGNOSIS — Z331 Pregnant state, incidental: Secondary | ICD-10-CM

## 2013-03-21 LAB — POCT URINALYSIS DIPSTICK
Blood, UA: NEGATIVE
GLUCOSE UA: NEGATIVE
KETONES UA: NEGATIVE
LEUKOCYTES UA: NEGATIVE
NITRITE UA: NEGATIVE

## 2013-03-21 NOTE — Addendum Note (Signed)
Addended by: Colen DarlingYOUNG, Aven Cegielski S on: 03/21/2013 03:34 PM   Modules accepted: Orders

## 2013-03-21 NOTE — Progress Notes (Signed)
Blood sugars are better will hold on the glyburide for now  BP weight and urine results all reviewed and noted. Patient reports good fetal movement, denies any bleeding and no rupture of membranes symptoms or regular contractions. Patient is without complaints. All questions were answered.

## 2013-04-04 ENCOUNTER — Encounter: Payer: Self-pay | Admitting: Advanced Practice Midwife

## 2013-04-04 ENCOUNTER — Ambulatory Visit (INDEPENDENT_AMBULATORY_CARE_PROVIDER_SITE_OTHER): Payer: Medicaid Other | Admitting: Advanced Practice Midwife

## 2013-04-04 VITALS — BP 108/60 | Wt 120.0 lb

## 2013-04-04 DIAGNOSIS — O99810 Abnormal glucose complicating pregnancy: Secondary | ICD-10-CM

## 2013-04-04 DIAGNOSIS — Z1389 Encounter for screening for other disorder: Secondary | ICD-10-CM

## 2013-04-04 DIAGNOSIS — Z331 Pregnant state, incidental: Secondary | ICD-10-CM

## 2013-04-04 DIAGNOSIS — Z3480 Encounter for supervision of other normal pregnancy, unspecified trimester: Secondary | ICD-10-CM

## 2013-04-04 DIAGNOSIS — O24410 Gestational diabetes mellitus in pregnancy, diet controlled: Secondary | ICD-10-CM

## 2013-04-04 DIAGNOSIS — Z8632 Personal history of gestational diabetes: Secondary | ICD-10-CM

## 2013-04-04 DIAGNOSIS — O24419 Gestational diabetes mellitus in pregnancy, unspecified control: Secondary | ICD-10-CM

## 2013-04-04 DIAGNOSIS — O321XX0 Maternal care for breech presentation, not applicable or unspecified: Secondary | ICD-10-CM | POA: Insufficient documentation

## 2013-04-04 DIAGNOSIS — O09219 Supervision of pregnancy with history of pre-term labor, unspecified trimester: Secondary | ICD-10-CM

## 2013-04-04 DIAGNOSIS — O99019 Anemia complicating pregnancy, unspecified trimester: Secondary | ICD-10-CM

## 2013-04-04 DIAGNOSIS — O09293 Supervision of pregnancy with other poor reproductive or obstetric history, third trimester: Secondary | ICD-10-CM | POA: Insufficient documentation

## 2013-04-04 LAB — POCT URINALYSIS DIPSTICK
Blood, UA: NEGATIVE
GLUCOSE UA: NEGATIVE
KETONES UA: NEGATIVE
Leukocytes, UA: NEGATIVE
Nitrite, UA: NEGATIVE

## 2013-04-04 LAB — OB RESULTS CONSOLE GC/CHLAMYDIA
CHLAMYDIA, DNA PROBE: NEGATIVE
Gonorrhea: NEGATIVE

## 2013-04-04 MED ORDER — GLYBURIDE 2.5 MG PO TABS: 2.5000 mg | ORAL_TABLET | Freq: Two times a day (BID) | ORAL | Status: DC

## 2013-04-04 NOTE — Patient Instructions (Signed)
Gestational Diabetes Mellitus Gestational diabetes mellitus, often simply referred to as gestational diabetes, is a type of diabetes that some women develop during pregnancy. In gestational diabetes, the pancreas does not make enough insulin (a hormone), the cells are less responsive to the insulin that is made (insulin resistance), or both.Normally, insulin moves sugars from food into the tissue cells. The tissue cells use the sugars for energy. The lack of insulin or the lack of normal response to insulin causes excess sugars to build up in the blood instead of going into the tissue cells. As a result, high blood sugar (hyperglycemia) develops. The effect of high sugar (glucose) levels can cause many complications.  RISK FACTORS You have an increased chance of developing gestational diabetes if you have a family history of diabetes and also have one or more of the following risk factors:  A body mass index over 30 (obesity).  A previous pregnancy with gestational diabetes.  An older age at the time of pregnancy. If blood glucose levels are kept in the normal range during pregnancy, women can have a healthy pregnancy. If your blood glucose levels are not well controlled, there may be risks to you, your unborn baby (fetus), your labor and delivery, or your newborn baby.  SYMPTOMS  If symptoms are experienced, they are much like symptoms you would normally expect during pregnancy. The symptoms of gestational diabetes include:   Increased thirst (polydipsia).  Increased urination (polyuria).  Increased urination during the night (nocturia).  Weight loss. This weight loss may be rapid.  Frequent, recurring infections.  Tiredness (fatigue).  Weakness.  Vision changes, such as blurred vision.  Fruity smell to your breath.  Abdominal pain. DIAGNOSIS Diabetes is diagnosed when blood glucose levels are increased. Your blood glucose level may be checked by one or more of the following  blood tests:  A fasting blood glucose test. You will not be allowed to eat for at least 8 hours before a blood sample is taken.  A random blood glucose test. Your blood glucose is checked at any time of the day regardless of when you ate.  A hemoglobin A1c blood glucose test. A hemoglobin A1c test provides information about blood glucose control over the previous 3 months.  An oral glucose tolerance test (OGTT). Your blood glucose is measured after you have not eaten (fasted) for 1 3 hours and then after you drink a glucose-containing beverage. Since the hormones that cause insulin resistance are highest at about 24 28 weeks of a pregnancy, an OGTT is usually performed during that time. If you have risk factors for gestational diabetes, your caregiver may test you for gestational diabetes earlier than 24 weeks of pregnancy. TREATMENT   You will need to take diabetes medicine or insulin daily to keep blood glucose levels in the desired range.  You will need to match insulin dosing with exercise and healthy food choices. The treatment goal is to maintain the before meal (preprandial), bedtime, and overnight blood glucose level at 60 99 mg/dL during pregnancy. The treatment goal is to further maintain peak after meal blood sugar (postprandial glucose) level at 100 140 mg/dL. HOME CARE INSTRUCTIONS   Have your hemoglobin A1c level checked twice a year.  Perform daily blood glucose monitoring as directed by your caregiver. It is common to perform frequent blood glucose monitoring.  Monitor urine ketones when you are ill and as directed by your caregiver.  Take your diabetes medicine and insulin as directed by your caregiver to maintain   your blood glucose level in the desired range.  Never run out of diabetes medicine or insulin. It is needed every day.  Adjust insulin based on your intake of carbohydrates. Carbohydrates can raise blood glucose levels but need to be included in your diet.  Carbohydrates provide vitamins, minerals, and fiber which are an essential part of a healthy diet. Carbohydrates are found in fruits, vegetables, whole grains, dairy products, legumes, and foods containing added sugars.    Eat healthy foods. Alternate 3 meals with 3 snacks.  Maintain a healthy weight gain. The usual total expected weight gain varies according to your prepregnancy body mass index (BMI).  Carry a medical alert card or wear your medical alert jewelry.  Carry a 15 gram carbohydrate snack with you at all times to treat low blood glucose (hypoglycemia). Some examples of 15 gram carbohydrate snacks include:  Glucose tablets, 3 or 4   Glucose gel, 15 gram tube  Raisins, 2 tablespoons (24 g)  Jelly beans, 6  Animal crackers, 8  Fruit juice, regular soda, or low fat milk, 4 ounces (120 mL)  Gummy treats, 9    Recognize hypoglycemia. Hypoglycemia during pregnancy occurs with blood glucose levels of 60 mg/dL and below. The risk for hypoglycemia increases when fasting or skipping meals, during or after intense exercise, and during sleep. Hypoglycemia symptoms can include:  Tremors or shakes.  Decreased ability to concentrate.  Sweating.  Increased heart rate.  Headache.  Dry mouth.  Hunger.  Irritability.  Anxiety.  Restless sleep.  Altered speech or coordination.  Confusion.  Treat hypoglycemia promptly. If you are alert and able to safely swallow, follow the 15:15 rule:  Take 15 20 grams of rapid-acting glucose or carbohydrate. Rapid-acting options include glucose gel, glucose tablets, or 4 ounces (120 mL) of fruit juice, regular soda, or low fat milk.  Check your blood glucose level 15 minutes after taking the glucose.   Take 15 20 grams more of glucose if the repeat blood glucose level is still 70 mg/dL or below.  Eat a meal or snack within 1 hour once blood glucose levels return to normal.  Be alert to polyuria and polydipsia which are early  signs of hyperglycemia. An early awareness of hyperglycemia allows for prompt treatment. Treat hyperglycemia as directed by your caregiver.  Engage in at least 30 minutes of physical activity a day or as directed by your caregiver. Ten minutes of physical activity timed 30 minutes after each meal is encouraged to control postprandial blood glucose levels.  Adjust your insulin dosing and food intake as needed if you start a new exercise or sport.  Follow your sick day plan at any time you are unable to eat or drink as usual.  Avoid tobacco and alcohol use.  Follow up with your caregiver regularly.  Follow the advice of your caregiver regarding your prenatal and post-delivery (postpartum) appointments, meal planning, exercise, medicines, vitamins, blood tests, other medical tests, and physical activities.  Perform daily skin and foot care. Examine your skin and feet daily for cuts, bruises, redness, nail problems, bleeding, blisters, or sores.  Brush your teeth and gums at least twice a day and floss at least once a day. Follow up with your dentist regularly.  Schedule an eye exam during the first trimester of your pregnancy or as directed by your caregiver.  Share your diabetes management plan with your workplace or school.  Stay up-to-date with immunizations.  Learn to manage stress.  Obtain ongoing diabetes education and   support as needed. SEEK MEDICAL CARE IF:   You are unable to eat food or drink fluids for more than 6 hours.  You have nausea and vomiting for more than 6 hours.  You have a blood glucose level of 200 mg/dL and you have ketones in your urine.  There is a change in mental status.  You develop vision problems.  You have a persistent headache.  You have upper abdominal pain or discomfort.  You develop an additional serious illness.  You have diarrhea for more than 6 hours.  You have been sick or have had a fever for a couple of days and are not getting  better. SEEK IMMEDIATE MEDICAL CARE IF:   You have difficulty breathing.  You no longer feel the baby moving.  You are bleeding or have discharge from your vagina.  You start having premature contractions or labor. MAKE SURE YOU:  Understand these instructions.  Will watch your condition.  Will get help right away if you are not doing well or get worse. Document Released: 04/04/2000 Document Revised: 04/23/2012 Document Reviewed: 07/26/2011 [facility] Patient Information 2014 [place], [place]. External Cephalic Version External cephalic version is turning a baby that is presenting their buttocks first (breech) or is lying sideways in the uterus (transverse) to a head-first position. This makes the labor and delivery faster, safer for the mother and baby, and lessens the chance for a Cesarean section. It should not be tried until the pregnancy is [redacted] weeks along or longer. BEFORE THE PROCEDURE   Do not take aspirin.  Do not eat for 4 hours before the procedure.  Tell your caregiver if you have a cold, fever or an infection.  Tell your caregiver if you are having contractions.  Tell your caregiver if you are leaking or had a gush of fluid from your vagina.  Tell your caregiver if you have any vaginal bleeding or abnormal discharge.  If you are being admitted the same day, arrive at the hospital at least one hour before the procedure to sign any necessary documents and to get prepared for the procedure.  Tell your caregiver if you had any problems with anesthetics in the past.  Tell your caregiver if you are taking any medications that your caregiver does not know about. This includes over-the-counter and prescription drugs, herbs, eye drops and creams. PROCEDURE  First, an ultrasound is done to make sure the baby is breech or transverse.  A non-stress test or biophysical profile is done on the baby before the ECV. This is done to make sure it is safe for the baby to have the  ECV. It may also be done after the procedure to make sure the baby is OK.  ECV is done in the delivery/surgical room with an anesthesiologist present. There should be a setup for an emergency Cesarean section with a full nursing and nursery staff available and ready.  The patient may be given a medication to relax the uterine muscles. An epidural may be given for any discomfort. It is helpful for the success of the ECV.  An electronic fetal monitor is placed on the uterus during the procedure to make sure the baby is OK.  If the mother is Rh negative, Rho-gam will be given to her to prevent Rh problems for future pregnancies.  The mother is followed closely for 2 to 3 hours after the procedure to make sure no problems develop. BENEFITS OF ECV  Easier and safer labor and delivery for the  mother and baby.  Lower incidence of Cesarean section.  Lower costs with a vaginal delivery. RISKS OF ECV  The placenta pulls away from the wall of the uterus before delivery (abruption of the placenta).  Rupture of the uterus, especially in patients with a previous Cesarean section.  Fetal distress.  Early (premature) labor.  Premature rupture of the membranes.  The baby will return to the breech or transverse lie position.  Death of the fetus can happen, but is very rare. ECV SHOULD BE STOPPED IF:  The fetal heart tones drop.  The mother is having a lot of pain.  You cannot turn the baby after several attempts. ECV SHOULD NOT BE DONE IF:  The non-stress test or biophysical profile is abnormal.  There is vaginal bleeding.  An abnormal shaped uterus is present.  There is heart disease or uncontrolled high blood pressure in the mother.  There are twins or more.  The placenta covers the opening of the cervix (placenta previa).  You had a previous cesarean section with a classical incision or major surgery of the uterus.  There is not enough amniotic fluid in the sac  (oligohydramnios).  The baby is too small for the pregnancy or has not developed normally (anomaly).  Your membranes have ruptured. HOME CARE INSTRUCTIONS   Have someone take you home after the procedure.  Rest at home for several hours.  Have someone stay with you for a few hours after you get home.  After ECV, continue with your prenatal visits as directed.  Continue your regular diet, rest and activities.  Do not do any strenuous activities for a couple of days. SEEK IMMEDIATE MEDICAL CARE IF:   You develop vaginal bleeding.  You have fluid coming out of your vagina (bag of water may have broken).  You develop uterine contractions.  You do not feel the baby move or there is less movement of the baby.  You develop abdominal pain.  You develop an oral temperature of 102 F (38.9 C) or higher. Document Released: 06/21/2006 Document Revised: 03/21/2011 Document Reviewed: 04/16/2008 [facility] Patient Information 2014 [place]

## 2013-04-04 NOTE — Progress Notes (Signed)
No c/o at this time. Blood sugars:  All FBS>90, has had 2 120's.  1/2 of pp are > 120, max 166.  Start Glyburide 2.5mg  BID.  NST today, continue twice weekly. Baby breech.  JVF did coexam. Offered ECV for Monday (pt requests time to discuss with husband, think on it).

## 2013-04-05 ENCOUNTER — Telehealth (HOSPITAL_COMMUNITY): Payer: Self-pay | Admitting: *Deleted

## 2013-04-05 LAB — GC/CHLAMYDIA PROBE AMP
CT Probe RNA: NEGATIVE
GC PROBE AMP APTIMA: NEGATIVE

## 2013-04-05 NOTE — Telephone Encounter (Signed)
Preadmission screen  

## 2013-04-06 LAB — STREP B DNA PROBE: STREP GROUP B AG: NEGATIVE

## 2013-04-07 ENCOUNTER — Inpatient Hospital Stay (HOSPITAL_COMMUNITY): Payer: Medicaid Other

## 2013-04-07 ENCOUNTER — Observation Stay (HOSPITAL_COMMUNITY)
Admission: RE | Admit: 2013-04-07 | Discharge: 2013-04-07 | Disposition: A | Discharge status: Home / Self Care | Payer: Medicaid Other | Source: Ambulatory Visit | Attending: Obstetrics and Gynecology | Admitting: Obstetrics and Gynecology

## 2013-04-07 VITALS — BP 117/80 | HR 75 | Temp 98.0°F | Ht <= 58 in | Wt 120.0 lb

## 2013-04-07 DIAGNOSIS — Z794 Long term (current) use of insulin: Secondary | ICD-10-CM | POA: Insufficient documentation

## 2013-04-07 DIAGNOSIS — O99320 Drug use complicating pregnancy, unspecified trimester: Secondary | ICD-10-CM

## 2013-04-07 DIAGNOSIS — O24419 Gestational diabetes mellitus in pregnancy, unspecified control: Secondary | ICD-10-CM

## 2013-04-07 DIAGNOSIS — O99810 Abnormal glucose complicating pregnancy: Secondary | ICD-10-CM | POA: Insufficient documentation

## 2013-04-07 DIAGNOSIS — O321XX0 Maternal care for breech presentation, not applicable or unspecified: Secondary | ICD-10-CM

## 2013-04-07 DIAGNOSIS — O09899 Supervision of other high risk pregnancies, unspecified trimester: Secondary | ICD-10-CM

## 2013-04-07 DIAGNOSIS — O99330 Smoking (tobacco) complicating pregnancy, unspecified trimester: Secondary | ICD-10-CM | POA: Insufficient documentation

## 2013-04-07 DIAGNOSIS — IMO0002 Reserved for concepts with insufficient information to code with codable children: Secondary | ICD-10-CM

## 2013-04-07 DIAGNOSIS — O09219 Supervision of pregnancy with history of pre-term labor, unspecified trimester: Secondary | ICD-10-CM

## 2013-04-07 MED ORDER — LACTATED RINGERS IV SOLN
INTRAVENOUS | Status: DC
  Administered 2013-04-07: 08:00:00 via INTRAVENOUS

## 2013-04-07 MED ORDER — TERBUTALINE SULFATE 1 MG/ML IJ SOLN
0.2500 mg | Freq: Once | INTRAMUSCULAR | Status: AC
  Administered 2013-04-07: 0.25 mg via SUBCUTANEOUS

## 2013-04-07 MED ORDER — TERBUTALINE SULFATE 1 MG/ML IJ SOLN
INTRAMUSCULAR | Status: AC
  Filled 2013-04-07: qty 1

## 2013-04-07 NOTE — Discharge Summary (Signed)
  This 33 year old female gravida 3 para [id number] at 37 weeks 4 days was seen in labor and delivery for external cephalic version. [facility]ch presentation, normal amniotic fluid and placental location and the right upper quadrant. External cephalic version performed under terbutaline tocolysis with prompt conversion to vertex. Blood type Rh+ NST performed prior to and post procedure. Patient discharged home for followup 1 week

## 2013-04-07 NOTE — Progress Notes (Signed)
Pre procedure diagnosis: Pregnancy 37+4 weeks breech presentation Post procedure diagnosis: Pregnancy 37 weeks 4 days, vertex presentation Procedure: External cephalic version Provider: Glo Herring Complications none Procedure time: 4 minutes Details of procedure:. Patient signed consents had ultrasound confirmed persistent breech presentation, and terbutaline administered. IV access was obtained. After reactive NST criteria were met, fetal monitoring was discontinued while still version performed using a forward roll technique moving the vertex from the left upper quadrant counterclockwise into the vertex presentation on the first attempt with intermittent monitoring of fetal heart with ultrasound during the procedure showing a normal heart rate throughout. Ultrasound confirmed vertex presentation at completion of procedure, and review of record shows blood type O positive NST is being repeated at this time and appears reactive patient will be followed routinely in the office. Delivery at 39 weeks plan due to obstetric indications of gestational diabetes mellitus A2

## 2013-04-07 NOTE — H&P (Signed)
Alexis Avery is a 33 y.o. female presenting for External cephalic version at 37+wks. Alexis Avery confirmed last Thursday in office, and pt counselled in detail this morning, with risks, alternatives, and details of procedure reviewed with pt and partner.Marland Kitchen. History OB History   Grav Para Term Preterm Abortions TAB SAB Ect Mult Living   3 1  1 1  1   1      Past Medical History  Diagnosis Date  . Depression   . Preterm labor   . Anxiety   . Pregnant 10/24/2012  . Nausea and vomiting in pregnancy 10/24/2012  . Nicotine addiction 10/24/2012  . Gestational diabetes mellitus, antepartum    Past Surgical History  Procedure Laterality Date  . Cholecystectomy    . Tonsillectomy    . Vaginal delivery      X 1   Family History: family history includes Cancer in her maternal grandmother; Heart attack in her maternal uncle; Hypertension in her maternal uncle and mother. Social History:  reports that she has been smoking Cigarettes.  She has been smoking about 0.50 packs per day. She has never used smokeless tobacco. She reports that she does not drink alcohol or use illicit drugs.   Prenatal Transfer Tool  Maternal Diabetes: Yes:  Diabetes Type:  Insulin/Medication controlled Genetic Screening: Normal Maternal Ultrasounds/Referrals: Normal Fetal Ultrasounds or other Referrals:  None Maternal Substance Abuse:  No Significant Maternal Medications:  Meds include: Other: glyburide Significant Maternal Lab Results:  Lab values include: Other:  O positive blood type Other Comments:  None  ROS good fetal movement    Blood pressure 117/80, pulse 75, temperature 98 F (36.7 C), temperature source Oral, height 4\' 9"  (1.448 m), weight 120 lb (54.432 kg), last menstrual period 07/18/2012. Exam Physical Exam  Constitutional: She appears well-developed and well-nourished.  HENT:  Head: Normocephalic.  Eyes: Pupils are equal, round, and reactive to light.  Cardiovascular: Normal rate.    Respiratory: Effort normal.  GI: Soft.  Gravid  Uterus c/w dates. U/s shows normal af volume, Alexis Avery with vertex in LUQ and fetal spine is to left of maternal spine. Placenta is fundal, in ruq.    Prenatal labs: ABO, Rh: O/POS/-- (10/15 1602) Antibody: NEG (02/05 0904) Rubella: 9.38 (10/15 1602) RPR: NON REAC (02/05 0904)  HBsAg: NEGATIVE (10/15 1602)  HIV: NON REACTIVE (02/05 0904)  GBS: NEGATIVE (03/26 1400)   Assessment/Plan: Pregnancy 37+wk, Alexis Avery, Rh pos. External cephalic version.   Alexis Avery V 04/07/2013, 9:00 AM

## 2013-04-08 ENCOUNTER — Other Ambulatory Visit: Payer: Medicaid Other

## 2013-04-08 ENCOUNTER — Encounter: Payer: Medicaid Other | Admitting: Obstetrics & Gynecology

## 2013-04-09 ENCOUNTER — Inpatient Hospital Stay (HOSPITAL_COMMUNITY): Admission: RE | Admit: 2013-04-09 | Payer: Medicaid Other | Source: Ambulatory Visit

## 2013-04-11 ENCOUNTER — Telehealth: Payer: Self-pay | Admitting: Obstetrics & Gynecology

## 2013-04-11 ENCOUNTER — Telehealth (HOSPITAL_COMMUNITY): Payer: Self-pay | Admitting: *Deleted

## 2013-04-11 ENCOUNTER — Encounter: Payer: Self-pay | Admitting: Obstetrics & Gynecology

## 2013-04-11 ENCOUNTER — Ambulatory Visit (INDEPENDENT_AMBULATORY_CARE_PROVIDER_SITE_OTHER): Payer: Medicaid Other | Admitting: Obstetrics & Gynecology

## 2013-04-11 ENCOUNTER — Other Ambulatory Visit: Payer: Self-pay | Admitting: Obstetrics & Gynecology

## 2013-04-11 ENCOUNTER — Ambulatory Visit (INDEPENDENT_AMBULATORY_CARE_PROVIDER_SITE_OTHER): Payer: Medicaid Other

## 2013-04-11 ENCOUNTER — Other Ambulatory Visit: Payer: Self-pay | Admitting: Advanced Practice Midwife

## 2013-04-11 VITALS — BP 100/60 | Wt 118.0 lb

## 2013-04-11 DIAGNOSIS — O09219 Supervision of pregnancy with history of pre-term labor, unspecified trimester: Secondary | ICD-10-CM

## 2013-04-11 DIAGNOSIS — O321XX0 Maternal care for breech presentation, not applicable or unspecified: Secondary | ICD-10-CM

## 2013-04-11 DIAGNOSIS — O99320 Drug use complicating pregnancy, unspecified trimester: Secondary | ICD-10-CM

## 2013-04-11 DIAGNOSIS — O99810 Abnormal glucose complicating pregnancy: Secondary | ICD-10-CM

## 2013-04-11 DIAGNOSIS — O99019 Anemia complicating pregnancy, unspecified trimester: Secondary | ICD-10-CM

## 2013-04-11 DIAGNOSIS — O09899 Supervision of other high risk pregnancies, unspecified trimester: Secondary | ICD-10-CM

## 2013-04-11 DIAGNOSIS — O24419 Gestational diabetes mellitus in pregnancy, unspecified control: Secondary | ICD-10-CM

## 2013-04-11 DIAGNOSIS — Z331 Pregnant state, incidental: Secondary | ICD-10-CM

## 2013-04-11 DIAGNOSIS — O24410 Gestational diabetes mellitus in pregnancy, diet controlled: Secondary | ICD-10-CM

## 2013-04-11 DIAGNOSIS — IMO0002 Reserved for concepts with insufficient information to code with codable children: Secondary | ICD-10-CM

## 2013-04-11 DIAGNOSIS — Z1389 Encounter for screening for other disorder: Secondary | ICD-10-CM

## 2013-04-11 LAB — POCT URINALYSIS DIPSTICK
Glucose, UA: NEGATIVE
KETONES UA: NEGATIVE
Leukocytes, UA: NEGATIVE
Nitrite, UA: NEGATIVE
RBC UA: NEGATIVE

## 2013-04-11 MED ORDER — FLUOXETINE HCL 20 MG PO CAPS: 20.0000 mg | ORAL_CAPSULE | Freq: Two times a day (BID) | ORAL | Status: DC

## 2013-04-11 NOTE — Telephone Encounter (Signed)
Pt was seen today by Dr. Despina HiddenEure, forgot to ask him about it today. Pt was advised that Dr. Emelda FearFerguson would send 1 weeks worth of the medication to the pharmacy but that she would have to address a full Rx with Dr. Despina HiddenEure.

## 2013-04-11 NOTE — Telephone Encounter (Signed)
Preadmission screen  

## 2013-04-11 NOTE — Progress Notes (Signed)
U/S(38+1wks)-vtx active fetus, BPP 8/8, fluid WNL AFI-8.3cm, anterior Gr 2 placenta, EFW 5 lb 14 oz (10th%tile), FHR- 111 & 103BPM

## 2013-04-11 NOTE — Progress Notes (Signed)
Sonogram reviewed and report done.  FHR noted but excellent BPP 10%tile, A2 DM induce next week 04/18/2013  BP weight and urine results all reviewed and noted. Patient reports good fetal movement, denies any bleeding and no rupture of membranes symptoms or regular contractions. Patient is without complaints. All questions were answered.

## 2013-04-18 ENCOUNTER — Encounter (HOSPITAL_COMMUNITY): Payer: Medicaid Other | Admitting: Anesthesiology

## 2013-04-18 ENCOUNTER — Encounter (HOSPITAL_COMMUNITY): Payer: Self-pay

## 2013-04-18 ENCOUNTER — Inpatient Hospital Stay (HOSPITAL_COMMUNITY): Payer: Medicaid Other | Admitting: Anesthesiology

## 2013-04-18 ENCOUNTER — Inpatient Hospital Stay (HOSPITAL_COMMUNITY)
Admission: RE | Admit: 2013-04-18 | Discharge: 2013-04-19 | DRG: 775 | Disposition: A | Discharge status: Home / Self Care | Payer: Medicaid Other | Source: Ambulatory Visit | Attending: Family Medicine | Admitting: Family Medicine

## 2013-04-18 VITALS — BP 120/63 | HR 64 | Temp 99.0°F | Resp 20 | Ht <= 58 in | Wt 120.0 lb

## 2013-04-18 DIAGNOSIS — O24419 Gestational diabetes mellitus in pregnancy, unspecified control: Secondary | ICD-10-CM | POA: Diagnosis present

## 2013-04-18 DIAGNOSIS — O321XX0 Maternal care for breech presentation, not applicable or unspecified: Secondary | ICD-10-CM

## 2013-04-18 DIAGNOSIS — IMO0002 Reserved for concepts with insufficient information to code with codable children: Secondary | ICD-10-CM | POA: Diagnosis present

## 2013-04-18 DIAGNOSIS — O99334 Smoking (tobacco) complicating childbirth: Secondary | ICD-10-CM | POA: Diagnosis present

## 2013-04-18 DIAGNOSIS — O9902 Anemia complicating childbirth: Secondary | ICD-10-CM | POA: Diagnosis present

## 2013-04-18 DIAGNOSIS — Z8249 Family history of ischemic heart disease and other diseases of the circulatory system: Secondary | ICD-10-CM

## 2013-04-18 DIAGNOSIS — D649 Anemia, unspecified: Secondary | ICD-10-CM | POA: Diagnosis present

## 2013-04-18 DIAGNOSIS — O99320 Drug use complicating pregnancy, unspecified trimester: Secondary | ICD-10-CM

## 2013-04-18 DIAGNOSIS — O99814 Abnormal glucose complicating childbirth: Secondary | ICD-10-CM

## 2013-04-18 DIAGNOSIS — O365990 Maternal care for other known or suspected poor fetal growth, unspecified trimester, not applicable or unspecified: Secondary | ICD-10-CM | POA: Diagnosis present

## 2013-04-18 DIAGNOSIS — O09219 Supervision of pregnancy with history of pre-term labor, unspecified trimester: Secondary | ICD-10-CM

## 2013-04-18 DIAGNOSIS — O09899 Supervision of other high risk pregnancies, unspecified trimester: Secondary | ICD-10-CM

## 2013-04-18 DIAGNOSIS — O99344 Other mental disorders complicating childbirth: Secondary | ICD-10-CM | POA: Diagnosis present

## 2013-04-18 DIAGNOSIS — F341 Dysthymic disorder: Secondary | ICD-10-CM | POA: Diagnosis present

## 2013-04-18 LAB — CBC
HEMATOCRIT: 29 % — AB (ref 36.0–46.0)
HEMOGLOBIN: 9.8 g/dL — AB (ref 12.0–15.0)
MCH: 30.7 pg (ref 26.0–34.0)
MCHC: 33.8 g/dL (ref 30.0–36.0)
MCV: 90.9 fL (ref 78.0–100.0)
Platelets: 295 10*3/uL (ref 150–400)
RBC: 3.19 MIL/uL — AB (ref 3.87–5.11)
RDW: 14.3 % (ref 11.5–15.5)
WBC: 12.5 10*3/uL — AB (ref 4.0–10.5)

## 2013-04-18 LAB — RAPID URINE DRUG SCREEN, HOSP PERFORMED
Amphetamines: NOT DETECTED
BARBITURATES: NOT DETECTED
Benzodiazepines: NOT DETECTED
Cocaine: NOT DETECTED
Opiates: NOT DETECTED
Tetrahydrocannabinol: NOT DETECTED

## 2013-04-18 LAB — GLUCOSE, CAPILLARY
GLUCOSE-CAPILLARY: 73 mg/dL (ref 70–99)
GLUCOSE-CAPILLARY: 87 mg/dL (ref 70–99)
Glucose-Capillary: 75 mg/dL (ref 70–99)
Glucose-Capillary: 54 mg/dL — ABNORMAL LOW (ref 70–99)

## 2013-04-18 LAB — GLUCOSE, RANDOM: GLUCOSE: 62 mg/dL — AB (ref 70–99)

## 2013-04-18 LAB — RPR

## 2013-04-18 MED ORDER — IBUPROFEN 600 MG PO TABS
600.0000 mg | ORAL_TABLET | Freq: Four times a day (QID) | ORAL | Status: DC | PRN
  Administered 2013-04-18: 600 mg via ORAL
  Filled 2013-04-18: qty 1

## 2013-04-18 MED ORDER — EPHEDRINE 5 MG/ML INJ: 10.0000 mg | INTRAVENOUS | Status: DC | PRN

## 2013-04-18 MED ORDER — PRENATAL MULTIVITAMIN CH
1.0000 | ORAL_TABLET | Freq: Every day | ORAL | Status: DC
  Administered 2013-04-19: 1 via ORAL
  Filled 2013-04-18: qty 1

## 2013-04-18 MED ORDER — ONDANSETRON HCL 4 MG PO TABS: 4.0000 mg | ORAL_TABLET | ORAL | Status: DC | PRN

## 2013-04-18 MED ORDER — DIBUCAINE 1 % RE OINT: 1.0000 "application " | TOPICAL_OINTMENT | RECTAL | Status: DC | PRN

## 2013-04-18 MED ORDER — LACTATED RINGERS IV SOLN: 500.0000 mL | Freq: Once | INTRAVENOUS | Status: DC

## 2013-04-18 MED ORDER — LACTATED RINGERS IV SOLN: 500.0000 mL | INTRAVENOUS | Status: DC | PRN

## 2013-04-18 MED ORDER — FENTANYL 2.5 MCG/ML BUPIVACAINE 1/10 % EPIDURAL INFUSION (WH - ANES): 14.0000 mL/h | INTRAMUSCULAR | Status: DC | PRN

## 2013-04-18 MED ORDER — ONDANSETRON HCL 4 MG/2ML IJ SOLN: 4.0000 mg | INTRAMUSCULAR | Status: DC | PRN

## 2013-04-18 MED ORDER — PHENYLEPHRINE 40 MCG/ML (10ML) SYRINGE FOR IV PUSH (FOR BLOOD PRESSURE SUPPORT)
80.0000 ug | PREFILLED_SYRINGE | INTRAVENOUS | Status: DC | PRN
  Filled 2013-04-18: qty 2
  Filled 2013-04-18: qty 10

## 2013-04-18 MED ORDER — LIDOCAINE HCL (PF) 1 % IJ SOLN
30.0000 mL | INTRAMUSCULAR | Status: AC | PRN
  Administered 2013-04-18: 7 mL via SUBCUTANEOUS
  Administered 2013-04-18: 3 mL via SUBCUTANEOUS

## 2013-04-18 MED ORDER — SODIUM CHLORIDE 0.9 % IV SOLN: INTRAVENOUS | Status: DC

## 2013-04-18 MED ORDER — OXYCODONE-ACETAMINOPHEN 5-325 MG PO TABS
1.0000 | ORAL_TABLET | ORAL | Status: DC | PRN
Start: 2013-04-18 — End: 2013-04-18
  Administered 2013-04-18: 1 via ORAL
  Filled 2013-04-18: qty 1

## 2013-04-18 MED ORDER — EPHEDRINE 5 MG/ML INJ
10.0000 mg | INTRAVENOUS | Status: DC | PRN
  Filled 2013-04-18: qty 4
  Filled 2013-04-18: qty 2

## 2013-04-18 MED ORDER — SIMETHICONE 80 MG PO CHEW: 80.0000 mg | CHEWABLE_TABLET | ORAL | Status: DC | PRN

## 2013-04-18 MED ORDER — FENTANYL 2.5 MCG/ML BUPIVACAINE 1/10 % EPIDURAL INFUSION (WH - ANES)
14.0000 mL/h | INTRAMUSCULAR | Status: DC | PRN
  Administered 2013-04-18: 14 mL/h via EPIDURAL
  Filled 2013-04-18: qty 125

## 2013-04-18 MED ORDER — IBUPROFEN 600 MG PO TABS
600.0000 mg | ORAL_TABLET | Freq: Four times a day (QID) | ORAL | Status: DC
  Administered 2013-04-19 (×3): 600 mg via ORAL
  Filled 2013-04-18 (×3): qty 1

## 2013-04-18 MED ORDER — ACETAMINOPHEN 325 MG PO TABS: 650.0000 mg | ORAL_TABLET | ORAL | Status: DC | PRN

## 2013-04-18 MED ORDER — PHENYLEPHRINE 40 MCG/ML (10ML) SYRINGE FOR IV PUSH (FOR BLOOD PRESSURE SUPPORT): 80.0000 ug | PREFILLED_SYRINGE | INTRAVENOUS | Status: DC | PRN

## 2013-04-18 MED ORDER — ONDANSETRON HCL 4 MG/2ML IJ SOLN: 4.0000 mg | Freq: Four times a day (QID) | INTRAMUSCULAR | Status: DC | PRN

## 2013-04-18 MED ORDER — WITCH HAZEL-GLYCERIN EX PADS: 1.0000 "application " | MEDICATED_PAD | CUTANEOUS | Status: DC | PRN

## 2013-04-18 MED ORDER — SENNOSIDES-DOCUSATE SODIUM 8.6-50 MG PO TABS
2.0000 | ORAL_TABLET | ORAL | Status: DC
  Administered 2013-04-19: 2 via ORAL
  Filled 2013-04-18: qty 2

## 2013-04-18 MED ORDER — DIPHENHYDRAMINE HCL 50 MG/ML IJ SOLN: 12.5000 mg | INTRAMUSCULAR | Status: DC | PRN

## 2013-04-18 MED ORDER — OXYTOCIN BOLUS FROM INFUSION: 500.0000 mL | INTRAVENOUS | Status: DC

## 2013-04-18 MED ORDER — LANOLIN HYDROUS EX OINT: TOPICAL_OINTMENT | CUTANEOUS | Status: DC | PRN

## 2013-04-18 MED ORDER — PHENYLEPHRINE 40 MCG/ML (10ML) SYRINGE FOR IV PUSH (FOR BLOOD PRESSURE SUPPORT)
80.0000 ug | PREFILLED_SYRINGE | INTRAVENOUS | Status: DC | PRN
  Filled 2013-04-18: qty 2

## 2013-04-18 MED ORDER — TERBUTALINE SULFATE 1 MG/ML IJ SOLN
0.2500 mg | Freq: Once | INTRAMUSCULAR | Status: DC | PRN
Start: 2013-04-18 — End: 2013-04-18

## 2013-04-18 MED ORDER — LIDOCAINE HCL (PF) 1 % IJ SOLN
10.0000 mL | Freq: Once | INTRAMUSCULAR | Status: AC
  Administered 2013-04-18: 10 mL via SUBCUTANEOUS

## 2013-04-18 MED ORDER — TETANUS-DIPHTH-ACELL PERTUSSIS 5-2.5-18.5 LF-MCG/0.5 IM SUSP: 0.5000 mL | Freq: Once | INTRAMUSCULAR | Status: DC

## 2013-04-18 MED ORDER — LACTATED RINGERS IV SOLN
INTRAVENOUS | Status: DC
  Administered 2013-04-18 (×3): via INTRAVENOUS

## 2013-04-18 MED ORDER — CITRIC ACID-SODIUM CITRATE 334-500 MG/5ML PO SOLN: 30.0000 mL | ORAL | Status: DC | PRN

## 2013-04-18 MED ORDER — GLYBURIDE 2.5 MG PO TABS
2.5000 mg | ORAL_TABLET | Freq: Every day | ORAL | Status: DC
  Administered 2013-04-18: 2.5 mg via ORAL
  Filled 2013-04-18 (×2): qty 1

## 2013-04-18 MED ORDER — LIDOCAINE HCL (PF) 1 % IJ SOLN
INTRAMUSCULAR | Status: AC
  Filled 2013-04-18: qty 30

## 2013-04-18 MED ORDER — BENZOCAINE-MENTHOL 20-0.5 % EX AERO
1.0000 "application " | INHALATION_SPRAY | CUTANEOUS | Status: DC | PRN
  Filled 2013-04-18: qty 56

## 2013-04-18 MED ORDER — OXYCODONE-ACETAMINOPHEN 5-325 MG PO TABS
1.0000 | ORAL_TABLET | ORAL | Status: DC | PRN
  Administered 2013-04-18 – 2013-04-19 (×4): 1 via ORAL
  Filled 2013-04-18 (×3): qty 1

## 2013-04-18 MED ORDER — EPHEDRINE 5 MG/ML INJ
10.0000 mg | INTRAVENOUS | Status: DC | PRN
  Filled 2013-04-18: qty 2

## 2013-04-18 MED ORDER — OXYTOCIN 40 UNITS IN LACTATED RINGERS INFUSION - SIMPLE MED: 62.5000 mL/h | INTRAVENOUS | Status: DC

## 2013-04-18 MED ORDER — DIPHENHYDRAMINE HCL 25 MG PO CAPS: 25.0000 mg | ORAL_CAPSULE | Freq: Four times a day (QID) | ORAL | Status: DC | PRN

## 2013-04-18 MED ORDER — ZOLPIDEM TARTRATE 5 MG PO TABS: 5.0000 mg | ORAL_TABLET | Freq: Every evening | ORAL | Status: DC | PRN

## 2013-04-18 MED ORDER — OXYTOCIN 40 UNITS IN LACTATED RINGERS INFUSION - SIMPLE MED
1.0000 m[IU]/min | INTRAVENOUS | Status: DC
  Administered 2013-04-18: 2 m[IU]/min via INTRAVENOUS
  Filled 2013-04-18: qty 1000

## 2013-04-18 NOTE — Anesthesia Procedure Notes (Signed)
Epidural Patient location during procedure: OB Start time: 04/18/2013 11:46 AM End time: 04/18/2013 11:59 AM  Staffing Anesthesiologist: Kynzleigh Bandel, CHRIS Performed by: anesthesiologist   Preanesthetic Checklist Completed: patient identified, surgical consent, pre-op evaluation, timeout performed, IV checked, risks and benefits discussed and monitors and equipment checked  Epidural Patient position: sitting Prep: site prepped and draped and DuraPrep Patient monitoring: heart rate, cardiac monitor, continuous pulse ox and blood pressure Approach: midline Location: L3-L4 Injection technique: LOR saline  Needle:  Needle type: Tuohy  Needle gauge: 17 G Needle length: 9 cm Needle insertion depth: 4 cm Catheter type: closed end flexible Catheter size: 19 Gauge Catheter at skin depth: 11 cm Test dose: Other  Assessment Events: blood not aspirated, injection not painful, no injection resistance, negative IV test and no paresthesia  Additional Notes H+P and labs checked, risks and benefits discussed with the patient, consent obtained, procedure tolerated well and without complications.  Reason for block:procedure for pain

## 2013-04-18 NOTE — Progress Notes (Signed)
Leftwitch-Kirby, CNM at bedside checking on pushing progress needs to step off the unit to speak with Shawnie PonsPratt. Will call when ready for delivery.

## 2013-04-18 NOTE — Progress Notes (Signed)
Alexis Avery is a 33 y.o. [id number] at 6060w1d, admitted for IOL for GDM (A2, on glyburide) IUGR EFW 5 lb (10th %tile, on US 04/04/13).  Subjective: Uncomfortable with contractions, some bleeding with cervical check. Interested in Epidural.  Objective: BP 125/73  Pulse 90  Temp(Src) 98.2 F (36.8 C) (Oral)  Resp 18  Ht 4\' 9"  (1.448 m)  Wt 54.432 kg (120 lb)  BMI 25.96 kg/m2  LMP 07/18/2012      FHT:  FHR: 120 bpm, variability: moderate,  accelerations:  Present,  decelerations:  Absent UC:   regular, every 2-4 minutes SVE:   Dilation: 3 Effacement (%): 50 Station: -2 Exam by:: leftwitch-kirby, CNM  Bedside US: cephalic (confirmed)  Labs: Lab Results  Component Value Date   WBC 12.5* 04/18/2013   HGB 9.8* 04/18/2013   HCT 29.0* 04/18/2013   MCV 90.9 04/18/2013   PLT 295 04/18/2013    Assessment / Plan: Induction of labor due to GDM (A2, glyburide), IUGR EFW 5 lb (10th %tile, on US 04/04/13). Suspected SOL prior to active intervention. Cervical progress 1.5 to 2-3cm, with regular painful contractions.  Labor: Progressing normally. AROM (1058) clear fluid, good head engagement Preeclampsia:  n/a Fetal Wellbeing:  Category I Pain Control:  Epidural I/D:  GBS negative Anticipated MOD:  NSVD  Alexis PilarAlexander Mallori Araque, DO [facility], PGY-1 04/18/2013, 11:06 AM

## 2013-04-18 NOTE — Anesthesia Preprocedure Evaluation (Signed)
Anesthesia Evaluation  Patient identified by MRN, date of birth, ID band Patient awake    Reviewed: Allergy & Precautions, H&P , NPO status , Patient's Chart, lab work & pertinent test results  History of Anesthesia Complications Negative for: history of anesthetic complications  Airway Mallampati: I TM Distance: >3 FB Neck ROM: Full    Dental  (+) Teeth Intact   Pulmonary neg shortness of breath, neg sleep apnea, neg COPDneg recent URI, Current Smoker,  breath sounds clear to auscultation        Cardiovascular negative cardio ROS  Rhythm:Regular     Neuro/Psych PSYCHIATRIC DISORDERS Anxiety negative neurological ROS     GI/Hepatic negative GI ROS, Neg liver ROS,   Endo/Other  diabetes, Gestational, Oral Hypoglycemic Agents  Renal/GU negative Renal ROS     Musculoskeletal negative musculoskeletal ROS (+)   Abdominal   Peds  Hematology  (+) anemia ,   Anesthesia Other Findings   Reproductive/Obstetrics (+) Pregnancy                           Anesthesia Physical Anesthesia Plan  ASA: II  Anesthesia Plan: Epidural   Post-op Pain Management:    Induction:   Airway Management Planned: Natural Airway  Additional Equipment: None  Intra-op Plan:   Post-operative Plan:   Informed Consent: I have reviewed the patients History and Physical, chart, labs and discussed the procedure including the risks, benefits and alternatives for the proposed anesthesia with the patient or authorized representative who has indicated his/her understanding and acceptance.   Dental advisory given  Plan Discussed with: Anesthesiologist  Anesthesia Plan Comments:         Anesthesia Quick Evaluation

## 2013-04-18 NOTE — Progress Notes (Signed)
hypoHypoglycemic Event  CBG:54  Treatment: 15 GM carbohydrate snack  Symptoms: Shaky  Follow-up CBG: [id number] CBG Result:73  Possible Reasons for Event: Inadequate meal intake  Comments/MD notified:Lisa Leftwitch-Kirby Notified. No orders rec. Pt given 4oz of cranberry juice at 1202.    Betha LoaJennifer Burgess Wray Goehring  Remember to initiate Hypoglycemia Order Set & complete

## 2013-04-18 NOTE — Progress Notes (Signed)
I have seen this patient and agree with the above resident's note.  LEFTWICH-KIRBY, Clydette Privitera Certified Nurse-Midwife 

## 2013-04-18 NOTE — Progress Notes (Signed)
Hypoglycemic Event  CBG:62  Treatment: 15 GM carbohydrate snack  Symptoms: None  Follow-up CBG: [id number] CBG Result:75  Possible Reasons for Event: Inadequate meal intake  Comments/MD notified Dr. Althea CharonKaramalegos notified. No orders rec    Betha LoaJennifer Burgess Ovila Lepage  Remember to initiate Hypoglycemia Order Set & complete

## 2013-04-18 NOTE — H&P (Signed)
Alexis Avery is a 33 y.o. female [phone] with IUP at 6564w1d presenting for IOL for GDM (A2) with IUGR (EFW 5 lb 14 oz, 10th%tile on US 04/04/13). Pt states she has been having regular, every 2-4 minutes contractions, associated without any vaginal bleeding other than episode of bloody show. Reports only small amount of clear fluid discharge but denies any LOF or gush of fluid, suspect membranes are intact. Good fetal movement.    Prenatal History/Complications: PNCare at [facility] since 8 wks with good PNC. Complicated by GDM (A2) abnormal 2hr GTT, started on Glyburide 2.5mg  BID, normal NSTs. Breech presentation at 37+4 weeks converted to Vertex following successful ECV (04/07/13). 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  Hx Anxiety / Depression - Prozac 20mg  BID (Followed by [facility])  OB Hx: Prior pre-term pregnancy (36 week) induced for low AFI.  Past Medical History: Past Medical History  Diagnosis Date  . Depression   . Preterm labor   . Anxiety   . Pregnant 10/24/2012  . Nausea and vomiting in pregnancy 10/24/2012  . Nicotine addiction 10/24/2012  . Gestational diabetes mellitus, antepartum     Past Surgical History: Past Surgical History  Procedure Laterality Date  . Cholecystectomy    . Tonsillectomy    . Vaginal delivery      X 1    Obstetrical History: OB History   Grav Para Term Preterm Abortions TAB SAB Ect Mult Living   3 1  1 1  1   1      Social History: History   Social History  . Marital Status: Single    Spouse Name: N/A    Number of Children: N/A  . Years of Education: N/A   Social History Main Topics  . Smoking status: Current Every Day Smoker -- 0.50 packs/day    Types: Cigarettes  . Smokeless tobacco: Never Used  . Alcohol Use: No  . Drug Use: No  . Sexual Activity: Yes    Birth Control/ Protection: None   Other Topics Concern  . Not on file   Social History Narrative   . No narrative on file    Family History: Family History  Problem Relation Age of Onset  . Hypertension Mother   . Cancer Maternal Grandmother     breast  . Hypertension Maternal Uncle   . Heart attack Maternal Uncle     Allergies: No Known Allergies  Prescriptions prior to admission  Medication Sig Dispense Refill  . acetaminophen (TYLENOL) 500 MG tablet Take 1,000 mg by mouth every 6 (six) hours as needed for moderate pain or headache.      . ferrous sulfate 325 (65 FE) MG tablet Take 325 mg by mouth daily with breakfast.      . FLUoxetine (PROZAC) 20 MG capsule Take 1 capsule (20 mg total) by mouth 2 (two) times daily.  15 capsule  0  . glyBURIDE (DIABETA) 2.5 MG tablet Take 2.5 mg by mouth 2 (two) times daily with a meal.      . ondansetron (ZOFRAN-ODT) 8 MG disintegrating tablet Take 8 mg by mouth every 8 (eight) hours as needed for nausea.      . Prenatal Vit-Fe Fumarate-FA (PRENATAL MULTIVITAMIN) TABS Take 1 tablet by mouth daily at 12 noon.        Review of Systems: Negative unless otherwise stated in History above  Physicial Blood pressure 114/64, pulse 76, temperature 98.2 F (36.8 C), temperature source Oral,  resp. rate 18, height 4\' 9"  (1.448 m), weight 54.432 kg (120 lb), last menstrual period 07/18/2012. General appearance: alert and cooperative, mildly uncomfortable with each UC Lungs: clear to auscultation bilaterally Heart: regular rate and rhythm Abdomen: soft, non-tender; bowel sounds normal Extremities: Homans sign is negative, no sign of DVT DTR's +2 Presentation: cephalic Fetal monitoringBaseline: 120 bpm, moderate variability, accels present 15x15, no decels Uterine activity: regular UC q 2-4 min, palpable  Dilation: 1.5 Effacement (%): 60 Station: -2 Exam by:: J.Cox, RN  Bedside US: Cephalic (confirmed)  Prenatal labs: ABO, Rh: O/POS/-- (10/15 1602) Antibody: NEG (02/05 0904) Rubella:   immune RPR: NON REAC (02/05 0904)  HBsAg: NEGATIVE  (10/15 1602)  HIV: NON REACTIVE (02/05 0904)  GBS: NEGATIVE (03/26 1400)  GTT: abnormal 2 hr (GDM, A2)  Prenatal Transfer Tool  Maternal Diabetes: Yes:  Diabetes Type:  Insulin/Medication controlled Genetic Screening: Normal Maternal Ultrasounds/Referrals: Normal Fetal Ultrasounds or other Referrals:  None Maternal Substance Abuse:  Yes:  Type: Other:  Significant Maternal Medications:  Meds include: Prozac Other:  Significant Maternal Lab Results: Lab values include: Group B Strep negative  Results for orders placed during the hospital encounter of 04/18/13 (from the past 24 hour(s))  CBC   Collection Time    04/18/13  7:40 AM      Result Value Ref Range   WBC 12.5 (*) 4.0 - 10.5 K/uL   RBC 3.19 (*) 3.87 - 5.11 MIL/uL   Hemoglobin 9.8 (*) 12.0 - 15.0 g/dL   HCT 08.6 (*) 57.8 - 46.9 %   MCV 90.9  78.0 - 100.0 fL   MCH 30.7  26.0 - 34.0 pg   MCHC 33.8  30.0 - 36.0 g/dL   RDW 62.9  52.8 - 41.3 %   Platelets 295  150 - 400 K/uL  GLUCOSE, RANDOM   Collection Time    04/18/13  7:40 AM      Result Value Ref Range   Glucose, Bld 62 (*) 70 - 99 mg/dL  GLUCOSE, CAPILLARY   Collection Time    04/18/13  8:44 AM      Result Value Ref Range   Glucose-Capillary 75  70 - 99 mg/dL    Assessment: Alexis Avery is a 33 y.o. [id number] at [redacted]w[redacted]d by LMP/Alexis [place] admitted for IOL for GDM (A2, on glyburide) IUGR EFW 5 lb (10th %tile, on [place] 04/04/13). SOL with regular UC, FHT reactive and reassuring.  #Labor: Active management IOL. If cervix progressing, consider FB vs pitocin #GDM, A2 - Continue Glyburide 2.5mg  BID, CBG monitoring, plan to switch to Glucose Stabilizer with active labor #Pain: Fentanyl IV and planning epidural #FWB: CAT-1 #ID: GBS negative #Feeding: Breast / Bottle #MOC: Nexplanon #Circ:  At Pediatrician's office after discharge.  Saralyn Pilar, DO [facility], PGY-1 04/18/2013, 10:13 AM

## 2013-04-18 NOTE — Progress Notes (Signed)
[name] is a 33 y.o. [id number] at 1061w1d admitted for induction of labor due to Gestational diabetes A2 on Glyburide.  Subjective:   Objective: BP 142/79  Pulse 66  Temp(Src) 98.2 F (36.8 C) (Oral)  Resp 18  Ht 4\' 9"  (1.448 m)  Wt 54.432 kg (120 lb)  BMI 25.96 kg/m2  SpO2 98%  LMP 07/18/2012     CBG (last 3)   Recent Labs  04/18/13 1201 04/18/13 1217 04/18/13 1437  GLUCAP 54* 73 87     FHT:  FHR: 135 bpm, variability: moderate,  accelerations:  Present,  decelerations:  Absent UC:   regular, every 2 minutes SVE:   Dilation: 9 Effacement (%): 100 Station: 0 Exam by:: J.Cox, RN  Labs: Lab Results  Component Value Date   WBC 12.5* 04/18/2013   HGB 9.8* 04/18/2013   HCT 29.0* 04/18/2013   MCV 90.9 04/18/2013   PLT 295 04/18/2013    Assessment / Plan: Induction of labor due to gestational diabetes,  progressing well on pitocin  Labor: Progressing normally Preeclampsia:  n/a Fetal Wellbeing:  Category I Pain Control:  Epidural I/D:  n/a Anticipated MOD:  NSVD  Alexis Avery 04/18/2013, 4:27 PM

## 2013-04-19 MED ORDER — FLUOXETINE HCL 20 MG PO CAPS: 20.0000 mg | ORAL_CAPSULE | Freq: Two times a day (BID) | ORAL | Status: DC

## 2013-04-19 MED ORDER — IBUPROFEN 600 MG PO TABS: 600.0000 mg | ORAL_TABLET | Freq: Four times a day (QID) | ORAL | Status: DC

## 2013-04-19 NOTE — Progress Notes (Signed)
UR chart review completed.  

## 2013-04-19 NOTE — Anesthesia Postprocedure Evaluation (Signed)
  Anesthesia Post-op Note  Patient: Alexis Avery  Procedure(s) Performed: * No procedures listed *  Patient Location: Mother/Baby  Anesthesia Type:Epidural  Level of Consciousness: awake  Airway and Oxygen Therapy: Patient Spontanous Breathing  Post-op Pain: none  Post-op Assessment: Patient's Cardiovascular Status Stable, Respiratory Function Stable, Patent Airway, No signs of Nausea or vomiting, Adequate PO intake, Pain level controlled, No headache, No backache, No residual numbness and No residual motor weakness  Post-op Vital Signs: Reviewed and stable  Last Vitals:  Filed Vitals:   04/19/13 0101  BP: 109/68  Pulse: 63  Temp: 37.1 C  Resp: 17    Complications: No apparent anesthesia complications

## 2013-04-19 NOTE — H&P (Signed)
Attestation of Attending Supervision of Advanced Practitioner (PA/CNM/NP): Evaluation and management procedures were performed by the Advanced Practitioner under my supervision and collaboration.  I have reviewed the Advanced Practitioner's note and chart, and I agree with the management and plan.  Samyak Sackmann, DO Attending Physician Faculty Practice, [facility]  

## 2013-04-19 NOTE — Discharge Summary (Signed)
Obstetric Discharge Summary Reason for Admission: induction of labor FOR aA2DM; IUGR Prenatal Procedures: NST, ultrasound and EVC for breech at 37 weeks Intrapartum Procedures: spontaneous vaginal delivery Postpartum Procedures: none Complications-Operative and Postpartum: none Hemoglobin  Date Value Ref Range Status  04/18/2013 9.8* 12.0 - 15.0 g/dL Final     HCT  Date Value Ref Range Status  04/18/2013 29.0* 36.0 - 46.0 % Final   HOSPITAL COURSE:  Pt underwent IOL for A2DM; blood sugars always good; had uncomplicated SVD; baby's sugars are good, eats well.  Physical Exam:  General: alert, cooperative and no distress Lochia: appropriate Uterine Fundus: firm DVT Evaluation: No evidence of DVT seen on physical exam. Negative Homan's sign.  Discharge Diagnoses: Term Pregnancy-delivered  Discharge Information: Date: 04/19/2013 Activity: pelvic rest Diet: routine Medications: Ibuprofen and prozac Condition: stable Instructions: refer to practice specific booklet Discharge to: home Follow-up Information   Follow up with [facility]. Schedule an appointment as soon as possible for a visit in 3 weeks. (For Nexplanon and 5-6 weeks for postpartum check up)    Contact information:   [address] [place] [phone]      Newborn Data: Live born female  Birth Weight: 6 lb 6 oz (2892 g) APGAR: 5, 9  Home with mother.  Alexis Avery 04/19/2013, 6:13 AM

## 2013-04-19 NOTE — Discharge Instructions (Signed)
Postpartum Care After Vaginal Delivery °After you deliver your newborn (postpartum period), the usual stay in the hospital is 24 72 hours. If there were problems with your labor or delivery, or if you have other medical problems, you might be in the hospital longer.  °While you are in the hospital, you will receive help and instructions on how to care for yourself and your newborn during the postpartum period.  °While you are in the hospital: °· Be sure to tell your nurses if you have pain or discomfort, as well as where you feel the pain and what makes the pain worse. °· If you had an incision made near your vagina (episiotomy) or if you had some tearing during delivery, the nurses may put ice packs on your episiotomy or tear. The ice packs may help to reduce the pain and swelling. °· If you are breastfeeding, you may feel uncomfortable contractions of your uterus for a couple of weeks. This is normal. The contractions help your uterus get back to normal size. °· It is normal to have some bleeding after delivery. °· For the first 1 3 days after delivery, the flow is red and the amount may be similar to a period. °· It is common for the flow to start and stop. °· In the first few days, you may pass some small clots. Let your nurses know if you begin to pass large clots or your flow increases. °· Do not  flush blood clots down the toilet before having the nurse look at them. °· During the next 3 10 days after delivery, your flow should become more watery and pink or brown-tinged in color. °· Ten to fourteen days after delivery, your flow should be a small amount of yellowish-white discharge. °· The amount of your flow will decrease over the first few weeks after delivery. Your flow may stop in 6 8 weeks. Most women have had their flow stop by 12 weeks after delivery. °· You should change your sanitary pads frequently. °· Wash your hands thoroughly with soap and water for at least 20 seconds after changing pads, using  the toilet, or before holding or feeding your newborn. °· You should feel like you need to empty your bladder within the first 6 8 hours after delivery. °· In case you become weak, lightheaded, or faint, call your nurse before you get out of bed for the first time and before you take a shower for the first time. °· Within the first few days after delivery, your breasts may begin to feel tender and full. This is called engorgement. Breast tenderness usually goes away within 48 72 hours after engorgement occurs. You may also notice milk leaking from your breasts. If you are not breastfeeding, do not stimulate your breasts. Breast stimulation can make your breasts produce more milk. °· Spending as much time as possible with your newborn is very important. During this time, you and your newborn can feel close and get to know each other. Having your newborn stay in your room (rooming in) will help to strengthen the bond with your newborn.  It will give you time to get to know your newborn and become comfortable caring for your newborn. °· Your hormones change after delivery. Sometimes the hormone changes can temporarily cause you to feel sad or tearful. These feelings should not last more than a few days. If these feelings last longer than that, you should talk to your caregiver. °· If desired, talk to your caregiver about   methods of family planning or contraception.  Talk to your caregiver about immunizations. Your caregiver may want you to have the following immunizations before leaving the hospital:  Tetanus, diphtheria, and pertussis (Tdap) or tetanus and diphtheria (Td) immunization. It is very important that you and your family (including grandparents) or others caring for your newborn are up-to-date with the Tdap or Td immunizations. The Tdap or Td immunization can help protect your newborn from getting ill.  Rubella immunization.  Varicella (chickenpox) immunization.  Influenza immunization. You should  receive this annual immunization if you did not receive the immunization during your pregnancy. Document Released: 10/24/2006 Document Revised: 09/21/2011 Document Reviewed: 08/24/2011 [facility] Patient Information 2014 [place] [place].    CHECK FASTING BLOOD SUGARS 1 TIME A WEEK; IF ABOVE 90, NOTIFY [facility]

## 2013-04-20 NOTE — Progress Notes (Signed)
Clinical Social Work Department PSYCHOSOCIAL ASSESSMENT - MATERNAL/CHILD 04/20/2013  [facility]  [name]  Account Number:  [id number]  Admit Date:  04/18/2013  Marjo Bickerhilds Name:    Clinical Social Worker:  Deloss Amico, LCSW   Date/Time:  04/20/2013 10:45 AM  Date Referred:  04/19/2013   Referral source  Central Nurs[facility]     Referred reason  Depression/Anxiety   Other referral source:    I:  FAMILY / HOME ENVIRONMENT Child's legal guardian:  PARENT  Guardian - Name Guardian - Age Guardian - Address  Alexis,Avery 32 [address]  [place], [place] [address]   Other household support members/support persons Other support:    II  PSYCHOSOCIAL DATA Information Source:    Event organiserinancial and Community Resources Employment:   Financial resources:  [employer] If [employer] - County:    School / Grade:   Maternity Care Coordinator / Child Services Coordination / Early Interventions:  Cultural issues impacting care:    III  STRENGTHS Strengths  Supportive family/friends  Home prepared for Child (including basic supplies)  Adequate Resources   Strength comment:    IV  RISK FACTORS AND CURRENT PROBLEMS Current Problem:       V  SOCIAL WORK ASSESSMENT Acknowledged order for Social Work consult to assess mother's history of anxiety and depression.  FOB was present and seems very supportive and attentive to mother and newborn.   Mother who is a single parent with one other dependent age 543.   Parents cohabitate.   Mother reports having an excellent support system.  Mother reports hx of anxiety and depression noting that she experienced PP Depression after the birth of her 31 year old and was started on medication.   She also states that she was seeing a therapist.  She denies any hx of psychiatric hospitalization.  Mother reports no current symptoms of depression or anxiety.    She also denies any hx of substance abuse.  No acute social concerns noted at this time.   Informed parents of CSW  availability.      VI SOCIAL WORK PLAN Social Work Plan  No Further Intervention Required / No Barriers to Discharge   Type of pt/family education:   If child protective services report - county:   If child protective services report - date:   Information/referral to community resources comment:   Pediatrician:  [facility]

## 2013-05-15 ENCOUNTER — Ambulatory Visit: Payer: Medicaid Other | Admitting: Advanced Practice Midwife

## 2013-05-29 ENCOUNTER — Encounter: Payer: Medicaid Other | Admitting: Advanced Practice Midwife

## 2013-06-07 ENCOUNTER — Encounter: Payer: Medicaid Other | Admitting: Obstetrics and Gynecology

## 2013-06-07 ENCOUNTER — Ambulatory Visit (INDEPENDENT_AMBULATORY_CARE_PROVIDER_SITE_OTHER): Payer: Medicaid Other | Admitting: Adult Health

## 2013-06-07 ENCOUNTER — Encounter: Payer: Self-pay | Admitting: Adult Health

## 2013-06-07 VITALS — BP 120/88 | Ht <= 58 in | Wt 107.5 lb

## 2013-06-07 DIAGNOSIS — Z3009 Encounter for other general counseling and advice on contraception: Secondary | ICD-10-CM

## 2013-06-07 DIAGNOSIS — Z3049 Encounter for surveillance of other contraceptives: Secondary | ICD-10-CM

## 2013-06-07 MED ORDER — PRENATAL PLUS 27-1 MG PO TABS: 1.0000 | ORAL_TABLET | Freq: Every day | ORAL | Status: DC

## 2013-06-07 NOTE — Patient Instructions (Signed)
NO SEX X 2 weeks Return in 1 week for post partum visit Etonogestrel implant What is this medicine? ETONOGESTREL (et oh noe JES trel) is a contraceptive (birth control) device. It is used to prevent pregnancy. It can be used for up to 3 years. This medicine may be used for other purposes; ask your health care provider or pharmacist if you have questions. COMMON BRAND NAME(S): Implanon, Nexplanon  What should I tell my health care provider before I take this medicine? They need to know if you have any of these conditions: -abnormal vaginal bleeding -blood vessel disease or blood clots -cancer of the breast, cervix, or liver -depression -diabetes -gallbladder disease -headaches -heart disease or recent heart attack -high blood pressure -high cholesterol -kidney disease -liver disease -renal disease -seizures -tobacco smoker -an unusual or allergic reaction to etonogestrel, other hormones, anesthetics or antiseptics, medicines, foods, dyes, or preservatives -pregnant or trying to get pregnant -breast-feeding How should I use this medicine? This device is inserted just under the skin on the inner side of your upper arm by a health care professional. Talk to your pediatrician regarding the use of this medicine in children. Special care may be needed. Overdosage: If you think you've taken too much of this medicine contact a poison control center or emergency room at once. Overdosage: If you think you have taken too much of this medicine contact a poison control center or emergency room at once. NOTE: This medicine is only for you. Do not share this medicine with others. What if I miss a dose? This does not apply. What may interact with this medicine? Do not take this medicine with any of the following medications: -amprenavir -bosentan -fosamprenavir This medicine may also interact with the following medications: -barbiturate medicines for inducing sleep or treating  seizures -certain medicines for fungal infections like ketoconazole and itraconazole -griseofulvin -medicines to treat seizures like carbamazepine, felbamate, oxcarbazepine, phenytoin, topiramate -modafinil -phenylbutazone -rifampin -some medicines to treat HIV infection like atazanavir, indinavir, lopinavir, nelfinavir, tipranavir, ritonavir -St. John's wort This list may not describe all possible interactions. Give your health care provider a list of all the medicines, herbs, non-prescription drugs, or dietary supplements you use. Also tell them if you smoke, drink alcohol, or use illegal drugs. Some items may interact with your medicine. What should I watch for while using this medicine? This product does not protect you against HIV infection (AIDS) or other sexually transmitted diseases. You should be able to feel the implant by pressing your fingertips over the skin where it was inserted. Tell your doctor if you cannot feel the implant. What side effects may I notice from receiving this medicine? Side effects that you should report to your doctor or health care professional as soon as possible: -allergic reactions like skin rash, itching or hives, swelling of the face, lips, or tongue -breast lumps -changes in vision -confusion, trouble speaking or understanding -dark urine -depressed mood -general ill feeling or flu-like symptoms -light-colored stools -loss of appetite, nausea -right upper belly pain -severe headaches -severe pain, swelling, or tenderness in the abdomen -shortness of breath, chest pain, swelling in a leg -signs of pregnancy -sudden numbness or weakness of the face, arm or leg -trouble walking, dizziness, loss of balance or coordination -unusual vaginal bleeding, discharge -unusually weak or tired -yellowing of the eyes or skin Side effects that usually do not require medical attention (Report these to your doctor or health care professional if they continue or  are bothersome.): -acne -breast pain -  changes in weight -cough -fever or chills -headache -irregular menstrual bleeding -itching, burning, and vaginal discharge -pain or difficulty passing urine -sore throat This list may not describe all possible side effects. Call your doctor for medical advice about side effects. You may report side effects to FDA at 1-800-FDA-1088. Where should I keep my medicine? This drug is given in a hospital or clinic and will not be stored at home. NOTE: This sheet is a summary. It may not cover all possible information. If you have questions about this medicine, talk to your doctor, pharmacist, or health care provider.  2014, Elsevier/Gold Standard. (2011-07-04 15:37:45)

## 2013-06-07 NOTE — Progress Notes (Addendum)
Subjective:     Patient ID: Alexis Avery, female   DOB: [birth year], 33 y.o.   MRN: [id number]  HPI Alexis Avery is 33 year old  White female in for nexplanon but had sex 3 days ago and no period since delivery, delivered 04/18/13 SVD boy.Discussed needs to not have sex for 2 week and will get stat [facility] in am and put nexplanon in pm.  Review of Systems See HPI Reviewed past medical,surgical, social and family history. Reviewed medications and allergies.     Objective:   Physical Exam BP 120/88  Ht 4\' 9"  (1.448 m)  Wt 107 lb 8 oz (48.762 kg)  BMI 23.26 kg/m2  Breastfeeding? No   Had sex 3 days ago, needs postpartum visit,discussed nexplanon.  Assessment:     Contraceptive ed.    Plan:    No sex x 2 weeks Return in 1 week for postpartum visit Review handout on nexplanon   Refilled prenatal plus #30 1 daily x 1 year

## 2013-06-12 ENCOUNTER — Ambulatory Visit: Payer: Medicaid Other | Admitting: Adult Health

## 2013-09-10 ENCOUNTER — Other Ambulatory Visit (HOSPITAL_COMMUNITY): Payer: Self-pay | Admitting: Advanced Practice Midwife

## 2013-10-14 ENCOUNTER — Other Ambulatory Visit (HOSPITAL_COMMUNITY): Payer: Self-pay | Admitting: Advanced Practice Midwife

## 2013-10-16 ENCOUNTER — Other Ambulatory Visit (HOSPITAL_COMMUNITY): Payer: Self-pay | Admitting: Advanced Practice Midwife

## 2013-11-11 ENCOUNTER — Encounter: Payer: Self-pay | Admitting: Adult Health

## 2014-01-21 ENCOUNTER — Emergency Department (HOSPITAL_COMMUNITY): Payer: Medicaid Other

## 2014-01-21 ENCOUNTER — Encounter (HOSPITAL_COMMUNITY): Payer: Self-pay | Admitting: *Deleted

## 2014-01-21 ENCOUNTER — Emergency Department (HOSPITAL_COMMUNITY)
Admission: EM | Admit: 2014-01-21 | Discharge: 2014-01-21 | Disposition: A | Discharge status: Other Acute Inpatient Hospital | Payer: Medicaid Other | Attending: Emergency Medicine | Admitting: Emergency Medicine

## 2014-01-21 ENCOUNTER — Encounter (HOSPITAL_COMMUNITY): Payer: Self-pay | Admitting: Emergency Medicine

## 2014-01-21 ENCOUNTER — Observation Stay (HOSPITAL_COMMUNITY)
Admission: AD | Admit: 2014-01-21 | Discharge: 2014-01-22 | Disposition: A | Discharge status: Home / Self Care | Payer: Self-pay | Source: Intra-hospital | Attending: Psychiatry | Admitting: Psychiatry

## 2014-01-21 DIAGNOSIS — Z720 Tobacco use: Secondary | ICD-10-CM | POA: Insufficient documentation

## 2014-01-21 DIAGNOSIS — F1114 Opioid abuse with opioid-induced mood disorder: Secondary | ICD-10-CM | POA: Insufficient documentation

## 2014-01-21 DIAGNOSIS — Z79899 Other long term (current) drug therapy: Secondary | ICD-10-CM | POA: Insufficient documentation

## 2014-01-21 DIAGNOSIS — R079 Chest pain, unspecified: Secondary | ICD-10-CM | POA: Insufficient documentation

## 2014-01-21 DIAGNOSIS — F329 Major depressive disorder, single episode, unspecified: Secondary | ICD-10-CM | POA: Insufficient documentation

## 2014-01-21 DIAGNOSIS — F1110 Opioid abuse, uncomplicated: Secondary | ICD-10-CM | POA: Insufficient documentation

## 2014-01-21 DIAGNOSIS — Z8632 Personal history of gestational diabetes: Secondary | ICD-10-CM | POA: Insufficient documentation

## 2014-01-21 DIAGNOSIS — F1910 Other psychoactive substance abuse, uncomplicated: Secondary | ICD-10-CM

## 2014-01-21 DIAGNOSIS — F419 Anxiety disorder, unspecified: Secondary | ICD-10-CM | POA: Insufficient documentation

## 2014-01-21 DIAGNOSIS — F1014 Alcohol abuse with alcohol-induced mood disorder: Secondary | ICD-10-CM | POA: Insufficient documentation

## 2014-01-21 DIAGNOSIS — F338 Other recurrent depressive disorders: Principal | ICD-10-CM | POA: Insufficient documentation

## 2014-01-21 DIAGNOSIS — F1010 Alcohol abuse, uncomplicated: Secondary | ICD-10-CM | POA: Insufficient documentation

## 2014-01-21 DIAGNOSIS — F1120 Opioid dependence, uncomplicated: Secondary | ICD-10-CM | POA: Diagnosis present

## 2014-01-21 DIAGNOSIS — F322 Major depressive disorder, single episode, severe without psychotic features: Secondary | ICD-10-CM

## 2014-01-21 DIAGNOSIS — F1994 Other psychoactive substance use, unspecified with psychoactive substance-induced mood disorder: Secondary | ICD-10-CM

## 2014-01-21 DIAGNOSIS — F1414 Cocaine abuse with cocaine-induced mood disorder: Secondary | ICD-10-CM | POA: Insufficient documentation

## 2014-01-21 DIAGNOSIS — F1914 Other psychoactive substance abuse with psychoactive substance-induced mood disorder: Secondary | ICD-10-CM | POA: Insufficient documentation

## 2014-01-21 DIAGNOSIS — F1310 Sedative, hypnotic or anxiolytic abuse, uncomplicated: Secondary | ICD-10-CM | POA: Insufficient documentation

## 2014-01-21 DIAGNOSIS — F1510 Other stimulant abuse, uncomplicated: Secondary | ICD-10-CM | POA: Insufficient documentation

## 2014-01-21 LAB — COMPREHENSIVE METABOLIC PANEL
ALT: 18 U/L (ref 0–35)
AST: 27 U/L (ref 0–37)
Albumin: 4.4 g/dL (ref 3.5–5.2)
Alkaline Phosphatase: 81 U/L (ref 39–117)
Anion gap: 9 (ref 5–15)
BUN: 13 mg/dL (ref 6–23)
CHLORIDE: 100 meq/L (ref 96–112)
CO2: 29 mmol/L (ref 19–32)
Calcium: 9.9 mg/dL (ref 8.4–10.5)
Creatinine, Ser: 0.61 mg/dL (ref 0.50–1.10)
GFR calc non Af Amer: >90 mL/min (ref >90)
GLUCOSE: 96 mg/dL (ref 70–99)
Potassium: 3.6 mmol/L (ref 3.5–5.1)
Sodium: 138 mmol/L (ref 135–145)
TOTAL PROTEIN: 7.6 g/dL (ref 6.0–8.3)
Total Bilirubin: 0.6 mg/dL (ref 0.3–1.2)

## 2014-01-21 LAB — CBC
HEMATOCRIT: 39.3 % (ref 36.0–46.0)
Hemoglobin: 13.2 g/dL (ref 12.0–15.0)
MCH: 30 pg (ref 26.0–34.0)
MCHC: 33.6 g/dL (ref 30.0–36.0)
MCV: 89.3 fL (ref 78.0–100.0)
Platelets: 230 10*3/uL (ref 150–400)
RBC: 4.4 MIL/uL (ref 3.87–5.11)
RDW: 13.4 % (ref 11.5–15.5)
WBC: 9.6 10*3/uL (ref 4.0–10.5)

## 2014-01-21 LAB — ACETAMINOPHEN LEVEL: Acetaminophen (Tylenol), Serum: <10 ug/mL — ABNORMAL LOW (ref 10–30)

## 2014-01-21 LAB — RAPID URINE DRUG SCREEN, HOSP PERFORMED
Amphetamines: POSITIVE — AB
BENZODIAZEPINES: POSITIVE — AB
Barbiturates: NOT DETECTED
COCAINE: NOT DETECTED
OPIATES: POSITIVE — AB
Tetrahydrocannabinol: NOT DETECTED

## 2014-01-21 LAB — SALICYLATE LEVEL: Salicylate Lvl: 7.7 mg/dL (ref 2.8–20.0)

## 2014-01-21 LAB — ETHANOL

## 2014-01-21 LAB — I-STAT TROPONIN, ED
Troponin i, poc: 0 ng/mL (ref 0.00–0.08)
Troponin i, poc: 0 ng/mL (ref 0.00–0.08)

## 2014-01-21 MED ORDER — LOPERAMIDE HCL 2 MG PO CAPS: 2.0000 mg | ORAL_CAPSULE | ORAL | Status: DC | PRN

## 2014-01-21 MED ORDER — HYDROXYZINE HCL 50 MG PO TABS: 50.0000 mg | ORAL_TABLET | Freq: Four times a day (QID) | ORAL | Status: DC | PRN

## 2014-01-21 MED ORDER — ONDANSETRON 4 MG PO TBDP
4.0000 mg | ORAL_TABLET | Freq: Four times a day (QID) | ORAL | Status: DC | PRN
  Administered 2014-01-22: 4 mg via ORAL
  Filled 2014-01-21: qty 1

## 2014-01-21 MED ORDER — THIAMINE HCL 100 MG/ML IJ SOLN: 100.0000 mg | Freq: Every day | INTRAMUSCULAR | Status: DC

## 2014-01-21 MED ORDER — METHOCARBAMOL 500 MG PO TABS
500.0000 mg | ORAL_TABLET | Freq: Three times a day (TID) | ORAL | Status: DC | PRN
  Administered 2014-01-21 – 2014-01-22 (×2): 500 mg via ORAL
  Filled 2014-01-21 (×2): qty 1

## 2014-01-21 MED ORDER — CLONIDINE HCL 0.1 MG PO TABS
0.1000 mg | ORAL_TABLET | Freq: Four times a day (QID) | ORAL | Status: DC
  Administered 2014-01-21 – 2014-01-22 (×4): 0.1 mg via ORAL
  Filled 2014-01-21 (×11): qty 1

## 2014-01-21 MED ORDER — ALUM & MAG HYDROXIDE-SIMETH 200-200-20 MG/5ML PO SUSP: 30.0000 mL | ORAL | Status: DC | PRN

## 2014-01-21 MED ORDER — MAGNESIUM HYDROXIDE 400 MG/5ML PO SUSP: 30.0000 mL | Freq: Every day | ORAL | Status: DC | PRN

## 2014-01-21 MED ORDER — LORAZEPAM 1 MG PO TABS
0.0000 mg | ORAL_TABLET | Freq: Four times a day (QID) | ORAL | Status: DC
  Administered 2014-01-21: 2 mg via ORAL
  Filled 2014-01-21: qty 2

## 2014-01-21 MED ORDER — LORAZEPAM 1 MG PO TABS: 0.0000 mg | ORAL_TABLET | Freq: Two times a day (BID) | ORAL | Status: DC

## 2014-01-21 MED ORDER — HYDROXYZINE HCL 25 MG PO TABS: 25.0000 mg | ORAL_TABLET | Freq: Four times a day (QID) | ORAL | Status: DC | PRN

## 2014-01-21 MED ORDER — VITAMIN B-1 100 MG PO TABS
100.0000 mg | ORAL_TABLET | Freq: Every day | ORAL | Status: DC
  Administered 2014-01-21: 100 mg via ORAL
  Filled 2014-01-21: qty 1

## 2014-01-21 MED ORDER — NAPROXEN 500 MG PO TABS
500.0000 mg | ORAL_TABLET | Freq: Two times a day (BID) | ORAL | Status: DC | PRN
Start: 2014-01-21 — End: 2014-01-22
  Administered 2014-01-22: 500 mg via ORAL
  Filled 2014-01-21: qty 1

## 2014-01-21 MED ORDER — DICYCLOMINE HCL 20 MG PO TABS: 20.0000 mg | ORAL_TABLET | Freq: Four times a day (QID) | ORAL | Status: DC | PRN

## 2014-01-21 MED ORDER — CLONIDINE HCL 0.1 MG PO TABS: 0.1000 mg | ORAL_TABLET | Freq: Every day | ORAL | Status: DC

## 2014-01-21 MED ORDER — CLONIDINE HCL 0.1 MG PO TABS: 0.1000 mg | ORAL_TABLET | ORAL | Status: DC

## 2014-01-21 MED ORDER — NICOTINE 21 MG/24HR TD PT24
21.0000 mg | MEDICATED_PATCH | Freq: Once | TRANSDERMAL | Status: DC
  Administered 2014-01-21: 21 mg via TRANSDERMAL
  Filled 2014-01-21: qty 1

## 2014-01-21 MED ORDER — ACETAMINOPHEN 325 MG PO TABS
650.0000 mg | ORAL_TABLET | Freq: Four times a day (QID) | ORAL | Status: DC | PRN
  Administered 2014-01-22: 650 mg via ORAL
  Filled 2014-01-21: qty 2

## 2014-01-21 MED ORDER — GABAPENTIN 100 MG PO CAPS
100.0000 mg | ORAL_CAPSULE | Freq: Three times a day (TID) | ORAL | Status: DC
  Administered 2014-01-21 – 2014-01-22 (×3): 100 mg via ORAL
  Filled 2014-01-21 (×8): qty 1

## 2014-01-21 NOTE — Progress Notes (Signed)
Pt alert and cooperative. -SI/HI, -A/vhall, verbally contracts for safety. Reports her goals are "To finish a program"; "I want to be stronger for my kids, I don't want this life for them".  C/o legs burning and back pain, see MAR.Marland Kitchen. Emotional support and encouragement given. Will continue to monitor closely and evaluate for stabilization.

## 2014-01-21 NOTE — BH Assessment (Signed)
[facility] INPATIENT:  Family/Significant Other Suicide Prevention Education  Suicide Prevention Education:  Patient Refusal for Family/Significant Other Suicide Prevention Education: The patient Alexis Avery has refused to provide written consent for family/significant other to be provided Family/Significant Other Suicide Prevention Education during admission and/or prior to discharge.  Physician notified.  Cranford MonBeaudry, Royanne Warshaw Evans 01/21/2014, 3:09 PM

## 2014-01-21 NOTE — BH Assessment (Addendum)
Assessment Note  Alexis Avery is an 34 y.o. female with a history of depression. Patient presents to [facility] today requesting detox. She is using alcohol, benzodiazepines, opiates, and crack cocaine. SEE ADDITIONAL SOCIAL HISTORY for details related to substance abuse. Patient reports Patient states that she has been using all these substances for several years. She reports heavy use over the weekend. Sts that consequences of her use include relational and financial. She states that she recently had a child, and wants to change her life. Patient denies SI, HI, and AVH's. She reports self mutilating behaviors of picking her skin. Patient increasingly depressed with hopelessness and loss of interest in usual pleasures.    Axis I: Polysubstance Abuse and Depressive Disorder NOS Axis II: Deferred Axis III:  Past Medical History  Diagnosis Date  . Depression   . Preterm labor   . Anxiety   . Pregnant 10/24/2012  . Nausea and vomiting in pregnancy 10/24/2012  . Nicotine addiction 10/24/2012  . Gestational diabetes mellitus, antepartum   . Contraceptive education 06/07/2013   Axis IV: other psychosocial or environmental problems, problems related to social environment, problems with access to health care services and problems with primary support group Axis V: 31-40 impairment in reality testing  Past Medical History:  Past Medical History  Diagnosis Date  . Depression   . Preterm labor   . Anxiety   . Pregnant 10/24/2012  . Nausea and vomiting in pregnancy 10/24/2012  . Nicotine addiction 10/24/2012  . Gestational diabetes mellitus, antepartum   . Contraceptive education 06/07/2013    Past Surgical History  Procedure Laterality Date  . Cholecystectomy    . Tonsillectomy    . Vaginal delivery      X 1    Family History:  Family History  Problem Relation Age of Onset  . Hypertension Mother   . Cancer Maternal Grandmother     breast  . Hypertension Maternal Uncle   . Heart  attack Maternal Uncle   . Heart attack Father     Social History:  reports that she has been smoking Cigarettes.  She has been smoking about 0.50 packs per day. She has never used smokeless tobacco. She reports that she does not drink alcohol or use illicit drugs.  Additional Social History:  Alcohol / Drug Use Pain Medications: SEE MAR Prescriptions: SEE MAR Over the Counter: SEE MAR History of alcohol / drug use?: Yes Negative Consequences of Use: Financial, Personal relationships Withdrawal Symptoms: Agitation, Diarrhea, Sweats, Fever / Chills, Irritability, Blackouts, Tingling, Anorexia, Nausea / Vomiting, Weakness, Cramps Substance #1 Name of Substance 1: Opiates-"Anything I can get from Vicodin.Marland Kitchento Roxi's, anything". 1 - Age of First Use: 17 yrs  1 - Amount (size/oz): "up to 20 Roxi's in a day or Percocet 15's" 1 - Frequency: daily  1 - Duration: on-going since April 2015 1 - Last Use / Amount: today 01/21/2013 Substance #2 Name of Substance 2: Suboxone  2 - Age of First Use: 34 yrs old  2 - Amount (size/oz): "  strips" 2 - Frequency: daily  2 - Duration: on-going  2 - Last Use / Amount: "This past weekend" Substance #3 Name of Substance 3: Benzo's "Xananx...usually the blue pills" 3 - Age of First Use: 34 yrs old  3 - Amount (size/oz): 5-10 pills per day  3 - Frequency: daily  3 - Duration: on-going  3 - Last Use / Amount: "This past weekend" Substance #4 Name of Substance 4: Alcohol  4 - Age of  First Use: 34 yrs old  4 - Amount (size/oz): "I typically share a pint of liqour with someone" 4 - Frequency: "few nights a week depends on whether of not I have medication (referring to opioids)" 4 - Duration: on-going  4 - Last Use / Amount: 6-12 beers  Substance #5 Name of Substance 5: Cocaine  5 - Age of First Use: 34 yrs old  5 - Amount (size/oz): "I don't even know it was alot" 5 - Frequency: "I don't usually use...1-2x's per year" 5 - Duration: on-going 5 - Last  Use / Amount: "This past weekend"  CIWA: CIWA-Ar BP: 151/98 mmHg Pulse Rate: 86 COWS:    Allergies: No Known Allergies  Home Medications:  (Not in a hospital admission)  OB/GYN Status:  Patient's last menstrual period was 12/21/2013.  General Assessment Data Location of Assessment: [facility] ED Is this a Tele or Face-to-Face Assessment?: Face-to-Face Is this an Initial Assessment or a Re-assessment for this encounter?: Initial Assessment Living Arrangements: Other (Comment), Children, Parent Can pt return to current living arrangement?: Yes Admission Status: Voluntary Is patient capable of signing voluntary admission?: Yes [facility] Living Arrangements: Other (Comment), Children, Parent Name of Psychiatrist:  (No psychiatrist ) Name of Therapist:  (No therapist )  Education Status Is patient currently in school?: No  Risk to self with the past 6 months Suicidal Ideation: No Suicidal Intent: No Is patient at risk for suicide?: No Suicidal Plan?: No Access to Means: No What has been your use of drugs/alcohol within the last 12 months?:  (opiates, benzo's, alcohol, cocaine ) Previous Attempts/Gestures: No How many times?:  (0) Other Self Harm Risks:  (n/a) Triggers for Past Attempts: Other (Comment) (none reported ) Intentional Self Injurious Behavior: None ("I pick at my self...face, arms, nose") Family Suicide History: Yes (Grandfather (maternal)- suicide attempt) Recent stressful life event(s): Other (Comment) (substance abuse, $, "everything irritates me") Persecutory voices/beliefs?: No Depression: Yes Depression Symptoms: Feeling angry/irritable, Feeling worthless/self pity, Loss of interest in usual pleasures, Guilt, Fatigue, Isolating, Tearfulness, Insomnia, Despondent Substance abuse history and/or treatment for substance abuse?: No Suicide prevention information given to non-admitted patients: Not applicable  Risk to  Others within the past 6 months Homicidal Ideation: No Thoughts of Harm to Others: No Current Homicidal Intent: No Current Homicidal Plan: No Access to Homicidal Means: No Identified Victim:  (n/a) History of harm to others?: No Assessment of Violence: None Noted Violent Behavior Description:  (patient is calm and cooperative ) Does patient have access to weapons?: No Criminal Charges Pending?: No Does patient have a court date: No  Psychosis Hallucinations: None noted Delusions: None noted  Mental Status Report Appear/Hygiene: In scrubs Eye Contact: Good Motor Activity: Freedom of movement Speech: Logical/coherent Level of Consciousness: Alert Mood: Depressed Affect: Appropriate to circumstance Anxiety Level: None Thought Processes: Coherent, Relevant Judgement: Unimpaired Orientation: Person, Place, Time, Situation Obsessive Compulsive Thoughts/Behaviors: Minimal  Cognitive Functioning Concentration: Normal Memory: Recent Intact, Remote Intact IQ: Average Insight: Good Impulse Control: Fair Appetite: Fair Weight Loss:  ("My weight fluctuates") Weight Gain:  ("My weight fluctuates") Sleep: Decreased Total Hours of Sleep:  (4-6 hours of sleep) Vegetative Symptoms: None  ADLScreening [facility] Assessment Services) Patient's cognitive ability adequate to safely complete daily activities?: Yes Patient able to express need for assistance with ADLs?: Yes Independently performs ADLs?: Yes (appropriate for developmental age)  Prior Inpatient Therapy Prior Inpatient Therapy: No Prior Therapy Dates:  (n/a) Prior Therapy  Facilty/Provider(s):  (n/a) Reason for Treatment:  (n/a)  Prior Outpatient Therapy Prior Outpatient Therapy: No Prior Therapy Dates:  (n/a) Prior Therapy Facilty/Provider(s):  (n/a) Reason for Treatment:  (n/a)  ADL Screening (condition at time of admission) Patient's cognitive ability adequate to safely complete daily activities?: Yes Is the patient  deaf or have difficulty hearing?: No Does the patient have difficulty seeing, even when wearing glasses/contacts?: No Does the patient have difficulty concentrating, remembering, or making decisions?: No Patient able to express need for assistance with ADLs?: Yes Does the patient have difficulty dressing or bathing?: No Independently performs ADLs?: Yes (appropriate for developmental age) Does the patient have difficulty walking or climbing stairs?: No Weakness of Legs: None Weakness of Arms/Hands: None  Home Assistive Devices/Equipment Home Assistive Devices/Equipment: None    Abuse/Neglect Assessment (Assessment to be complete while patient is alone) Physical Abuse: Denies Verbal Abuse: Denies Sexual Abuse: Denies Exploitation of patient/patient's resources: Denies Self-Neglect: Denies Values / Beliefs Cultural Requests During Hospitalization: None Spiritual Requests During Hospitalization: None   Advance Directives (For Healthcare) Does patient have an advance directive?: No Would patient like information on creating an advanced directive?: No - patient declined information    Additional Information 1:1 In Past 12 Months?: No CIRT Risk: No Elopement Risk: No Does patient have medical clearance?: Yes     Disposition:  Disposition Initial Assessment Completed for this Encounter: Yes Disposition of Patient: Other dispositions   Patient accepted to the observation unit at [facility]. Patient accepted to observation unit by Dr. Jannifer Franklin and Nanine Means, NP.  Bed assignment #3.   On Site Evaluation by:   Reviewed with Physician:    Melynda Ripple [facility] 01/21/2014 11:39 AM

## 2014-01-21 NOTE — BH Assessment (Signed)
Notified TTS,[place] of pt consult request.  Glorious PeachNajah Taunja Brickner, MS, LCASA Assessment Counselor

## 2014-01-21 NOTE — Progress Notes (Signed)
Patient ID: Alexis Avery, female   DOB: 1980-11-27, 34 y.o.   MRN: [id number] Nursing admission note:  Patient is a 34 year old female with a hx of depression and substance abuse.  [facility], benzos and opiates.  Patient states, "I drink 6 or more beers and take as many pills as I can."  Patient has been using crack cocaine. Patient states she has been using since she was 34 years old.  Patient lives with her mother and she is supportive.  She is unemployed.  Patient has two children ages 374 and 8 months.  She states, "It's time for me to get clean."  She denies any SI/HI/AVH.  Patient was oriented to OBS unit.

## 2014-01-21 NOTE — ED Notes (Signed)
PA was notified about Istat Tropinin levels

## 2014-01-21 NOTE — ED Notes (Addendum)
Pt requesting detox from opiates, benzos, and ETOH. Last use of each this morning, denies SI/HI

## 2014-01-21 NOTE — ED Provider Notes (Signed)
CSN: [id number]     Arrival date & time 01/21/14  4098 History   First MD Initiated Contact with Patient 01/21/14 1008     Chief Complaint  Patient presents with  . detox      (Consider location/radiation/quality/duration/timing/severity/associated sxs/prior Treatment) HPI Comments: Patient presents to the emergency department with chief complaint of requesting detox from alcohol, benzodiazepines, opiates, and crack cocaine. Patient states that she has been using all these substances for the past 15 years. She states that she recently had a child, and wants to change her life and get away from the substances. She reports having some low back pain, as well as restless legs. She also complains of some chest tightness and chest pain. She states that her last used crack cocaine was 3 days ago. She denies any SI or HI. She states that her last use of opiates, benzos, and alcohol was this morning.  The history is provided by the patient. No language interpreter was used.    Past Medical History  Diagnosis Date  . Depression   . Preterm labor   . Anxiety   . Pregnant 10/24/2012  . Nausea and vomiting in pregnancy 10/24/2012  . Nicotine addiction 10/24/2012  . Gestational diabetes mellitus, antepartum   . Contraceptive education 06/07/2013   Past Surgical History  Procedure Laterality Date  . Cholecystectomy    . Tonsillectomy    . Vaginal delivery      X 1   Family History  Problem Relation Age of Onset  . Hypertension Mother   . Cancer Maternal Grandmother     breast  . Hypertension Maternal Uncle   . Heart attack Maternal Uncle   . Heart attack Father    History  Substance Use Topics  . Smoking status: Current Every Day Smoker -- 0.50 packs/day    Types: Cigarettes  . Smokeless tobacco: Never Used  . Alcohol Use: No   OB History    Gravida Para Term Preterm AB TAB SAB Ectopic Multiple Living   Review of Systems  Constitutional: Negative for fever  and chills.  Respiratory: Negative for shortness of breath.   Cardiovascular: Positive for chest pain.  Gastrointestinal: Negative for nausea, vomiting, diarrhea and constipation.  Genitourinary: Negative for dysuria.  Musculoskeletal: Positive for back pain and arthralgias.  All other systems reviewed and are negative.     Allergies  Review of patient's allergies indicates no known allergies.  Home Medications   Prior to Admission medications   Medication Sig Start Date End Date Taking? Authorizing Provider  acetaminophen (TYLENOL) 500 MG tablet Take 1,000 mg by mouth every 6 (six) hours as needed for moderate pain or headache.   Yes Historical Provider, MD  FLUoxetine (PROZAC) 20 MG capsule Take 1 capsule (20 mg total) by mouth daily. 10/16/13  Yes Jacklyn Shell, CNM  ibuprofen (ADVIL,MOTRIN) 200 MG tablet Take 200 mg by mouth every 6 (six) hours as needed for headache or moderate pain.   Yes Historical Provider, MD  prenatal vitamin w/FE, FA (PRENATAL 1 + 1) 27-1 MG TABS tablet Take 1 tablet by mouth daily at 12 noon. 06/07/13  Yes Adline Potter, NP   BP 151/98 mmHg  Pulse 86  Temp(Src) 98.4 F (36.9 C) (Oral)  Resp 16  SpO2 100% Physical Exam  Constitutional: She is oriented to person, place, and time. She appears well-developed and well-nourished.  HENT:  Head: Normocephalic and  atraumatic.  Eyes: Conjunctivae and EOM are normal. Pupils are equal, round, and reactive to light.  Neck: Normal range of motion. Neck supple.  Cardiovascular: Normal rate and regular rhythm.  Exam reveals no gallop and no friction rub.   No murmur heard. Pulmonary/Chest: Effort normal and breath sounds normal. No respiratory distress. She has no wheezes. She has no rales. She exhibits no tenderness.  Abdominal: Soft. She exhibits no distension and no mass. There is no tenderness. There is no rebound and no guarding.  Musculoskeletal: Normal range of motion. She exhibits no edema or  tenderness.  Neurological: She is alert and oriented to person, place, and time.  Skin: Skin is warm and dry.  Psychiatric: She has a normal mood and affect. Her behavior is normal. Judgment and thought content normal.  Nursing note and vitals reviewed.   ED Course  Procedures (including critical care time) Results for orders placed or performed during the hospital encounter of 01/21/14  Acetaminophen level  Result Value Ref Range   Acetaminophen (Tylenol), Serum <10.0 (L) 10 - 30 ug/mL  CBC  Result Value Ref Range   WBC 9.6 4.0 - 10.5 K/uL   RBC 4.40 3.87 - 5.11 MIL/uL   Hemoglobin 13.2 12.0 - 15.0 g/dL   HCT 16.139.3 09.636.0 - 04.546.0 %   MCV 89.3 78.0 - 100.0 fL   MCH 30.0 26.0 - 34.0 pg   MCHC 33.6 30.0 - 36.0 g/dL   RDW 40.913.4 81.111.5 - 91.415.5 %   Platelets 230 150 - 400 K/uL  Comprehensive metabolic panel  Result Value Ref Range   Sodium 138 135 - 145 mmol/L   Potassium 3.6 3.5 - 5.1 mmol/L   Chloride 100 96 - 112 mEq/L   CO2 29 19 - 32 mmol/L   Glucose, Bld 96 70 - 99 mg/dL   BUN 13 6 - 23 mg/dL   Creatinine, Ser 7.820.61 0.50 - 1.10 mg/dL   Calcium 9.9 8.4 - 95.610.5 mg/dL   Total Protein 7.6 6.0 - 8.3 g/dL   Albumin 4.4 3.5 - 5.2 g/dL   AST 27 0 - 37 U/L   ALT 18 0 - 35 U/L   Alkaline Phosphatase 81 39 - 117 U/L   Total Bilirubin 0.6 0.3 - 1.2 mg/dL   GFR calc non Af Amer >90 >90 mL/min   GFR calc Af Amer >90 >90 mL/min   Anion gap 9 5 - 15  Ethanol (ETOH)  Result Value Ref Range   Alcohol, Ethyl (B) <5 0 - 9 mg/dL  Salicylate level  Result Value Ref Range   Salicylate Lvl 7.7 2.8 - 20.0 mg/dL  Urine Drug Screen  Result Value Ref Range   Opiates POSITIVE (A) NONE DETECTED   Cocaine NONE DETECTED NONE DETECTED   Benzodiazepines POSITIVE (A) NONE DETECTED   Amphetamines POSITIVE (A) NONE DETECTED   Tetrahydrocannabinol NONE DETECTED NONE DETECTED   Barbiturates NONE DETECTED NONE DETECTED  I-Stat Troponin, ED ([facility])  Result Value Ref Range   Troponin i, poc 0.00 0.00 -  0.08 ng/mL   Comment 3          I-stat troponin, ED  Result Value Ref Range   Troponin i, poc 0.00 0.00 - 0.08 ng/mL   Comment 3           Dg Chest 2 View  01/21/2014   CLINICAL DATA:  Cough, congestion, shortness of breath and mid chest pain.  EXAM: CHEST  2 VIEW  COMPARISON:  12/13/2008  FINDINGS: The heart size and mediastinal contours are within normal limits. Both lungs are clear. The visualized skeletal structures are unremarkable.  IMPRESSION: No active cardiopulmonary disease.   Electronically Signed   By: Charlett Nose M.D.   On: 01/21/2014 10:45     Imaging Review No results found.   EKG Interpretation None      MDM   Final diagnoses:  Chest pain  Substance abuse    Patient requesting detox from opiates, alcohol, and benzos.  TTS consult pending.  Patient accepted to [facility].   Roxy Horseman, PA-C 01/21/14 1628  Gerhard Munch, MD 01/21/14 [phone]

## 2014-01-21 NOTE — Progress Notes (Signed)
Patient ID: Early Osmondlisha Mckell, female   DOB: 11-Nov-1980, 34 y.o.   MRN: [id number] Ivor ReiningLeah Cockerham  contacted staff; she is the sister-in-law of patient's fiance.  Her telephone number is [phone].  [facility] has [facility] [place].  Arline AspCindy is the contact person there.  Patient will need a telephone interview tomorrow.  Phone number for [facility] is [phone].  They will also provide transportation for patient.

## 2014-01-21 NOTE — ED Notes (Signed)
Not[facility].

## 2014-01-21 NOTE — ED Notes (Signed)
Black purse, burgandy shirt, sweat shirt and black and pink shoes

## 2014-01-22 MED ORDER — NICOTINE 21 MG/24HR TD PT24
21.0000 mg | MEDICATED_PATCH | Freq: Every day | TRANSDERMAL | Status: DC
  Administered 2014-01-22: 21 mg via TRANSDERMAL
  Filled 2014-01-22 (×3): qty 1

## 2014-01-22 MED ORDER — GABAPENTIN 100 MG PO CAPS: 100.0000 mg | ORAL_CAPSULE | Freq: Three times a day (TID) | ORAL | Status: DC

## 2014-01-22 MED ORDER — HYDROXYZINE HCL 25 MG PO TABS: 25.0000 mg | ORAL_TABLET | Freq: Four times a day (QID) | ORAL | Status: DC | PRN

## 2014-01-22 NOTE — H&P (Signed)
[facility] OBS UNIT H&P   Alexis Avery is an 34 y.o. female. Total Time spent with patient: 45 minutes  Assessment: AXIS I:  Alcohol Abuse, Major Depression, Recurrent severe, Substance Abuse and Substance Induced Mood Disorder AXIS II:  Deferred AXIS III:   Past Medical History  Diagnosis Date  . Depression   . Preterm labor   . Anxiety   . Pregnant 10/24/2012  . Nausea and vomiting in pregnancy 10/24/2012  . Nicotine addiction 10/24/2012  . Gestational diabetes mellitus, antepartum   . Contraceptive education 06/07/2013   AXIS IV:  other psychosocial or environmental problems and problems related to social environment AXIS V:  51-60 moderate symptoms  Plan:  No evidence of imminent risk to self or others at present.   Supportive therapy provided about ongoing stressors. Discussed crisis plan, support from social network, calling 911, coming to the Emergency Department, and calling Suicide Hotline.  Subjective:   Alexis Avery is a 34 y.o. female patient admitted with reports that she would like to detox from alcohol, benzodiazepines, opiates, and cocaine. UDS was positive for all of these but cocaine. Pt denies SI, HI, and AVH, and is able to contract for safety. Pt reports that she has been having generalized aches and pains and would like Robaxin, which is part of the protocol. Pt's family includes an RN who called on her behalf to find placement for rehab. A bed [facility] secured [facility]; pt was eager to discharge to this facility at time of assessment.   HPI:  Alexis Avery is an 34 y.o. female with a history of depression. Patient presents to [facility] today requesting detox. She is using alcohol, benzodiazepines, opiates, and crack cocaine. SEE ADDITIONAL SOCIAL HISTORY for details related to substance abuse. Patient reports Patient states that she has been using all these substances for several years. She reports heavy use over the weekend. Sts that consequences of her use  include relational and financial. She states that she recently had a child, and wants to change her life. Patient denies SI, HI, and AVH's. She reports self mutilating behaviors of picking her skin. Patient increasingly depressed with hopelessness and loss of interest in usual pleasures.   HPI Elements:   Location:  Psychiatric. Quality:  Stable. Severity:  Moderate. Timing:  Constant. Duration:  Intermittent. Context:  Exacerbation of underlying substance abuse with relapse requiring observation for safety and stabilization.  Past Psychiatric History: Past Medical History  Diagnosis Date  . Depression   . Preterm labor   . Anxiety   . Pregnant 10/24/2012  . Nausea and vomiting in pregnancy 10/24/2012  . Nicotine addiction 10/24/2012  . Gestational diabetes mellitus, antepartum   . Contraceptive education 06/07/2013    reports that she has been smoking Cigarettes.  She has been smoking about 0.50 packs per day. She has never used smokeless tobacco. She reports that she drinks alcohol. She reports that she does not use illicit drugs. Family History  Problem Relation Age of Onset  . Hypertension Mother   . Cancer Maternal Grandmother     breast  . Hypertension Maternal Uncle   . Heart attack Maternal Uncle   . Heart attack Father      Living Arrangements: Parent   Abuse/Neglect [facility]) Physical Abuse: Denies Verbal Abuse: Denies Sexual Abuse: Denies Allergies:  No Known Allergies  ACT Assessment Complete:  Yes:    Educational Status    Risk to Self: Risk to self with the past 6 months Is patient at  risk for suicide?: No Substance abuse history and/or treatment for substance abuse?: Yes  Risk to Others:    Abuse: Abuse/Neglect Assessment (Assessment to be complete while patient is alone) Physical Abuse: Denies Verbal Abuse: Denies Sexual Abuse: Denies Exploitation of patient/patient's resources: Denies Self-Neglect: Denies  Prior Inpatient Therapy:    Prior Outpatient  Therapy:    Additional Information:       Objective: Blood pressure 132/89, pulse 71, temperature 97.5 F (36.4 C), temperature source Oral, resp. rate 16, height 4' 9"  (1.448 m), weight 43.092 kg (95 lb), last menstrual period 12/21/2013, SpO2 100 %, not currently breastfeeding.Body mass index is 20.55 kg/(m^2). Results for orders placed or performed during the hospital encounter of 01/21/14 (from the past 72 hour(s))  Acetaminophen level     Status: Abnormal   Collection Time: 01/21/14 10:16 AM  Result Value Ref Range   Acetaminophen (Tylenol), Serum <10.0 (L) 10 - 30 ug/mL    Comment:        THERAPEUTIC CONCENTRATIONS VARY SIGNIFICANTLY. A RANGE OF 10-30 ug/mL MAY BE AN EFFECTIVE CONCENTRATION FOR MANY PATIENTS. HOWEVER, SOME ARE BEST TREATED AT CONCENTRATIONS OUTSIDE THIS RANGE. ACETAMINOPHEN CONCENTRATIONS >150 ug/mL AT 4 HOURS AFTER INGESTION AND >50 ug/mL AT 12 HOURS AFTER INGESTION ARE OFTEN ASSOCIATED WITH TOXIC REACTIONS.   Ethanol (ETOH)     Status: None   Collection Time: 01/21/14 10:16 AM  Result Value Ref Range   Alcohol, Ethyl (B) <5 0 - 9 mg/dL    Comment:        LOWEST DETECTABLE LIMIT FOR SERUM ALCOHOL IS 11 mg/dL FOR MEDICAL PURPOSES ONLY   Salicylate level     Status: None   Collection Time: 01/21/14 10:16 AM  Result Value Ref Range   Salicylate Lvl 7.7 2.8 - 20.0 mg/dL  CBC     Status: None   Collection Time: 01/21/14 10:18 AM  Result Value Ref Range   WBC 9.6 4.0 - 10.5 K/uL   RBC 4.40 3.87 - 5.11 MIL/uL   Hemoglobin 13.2 12.0 - 15.0 g/dL   HCT 39.3 36.0 - 46.0 %   MCV 89.3 78.0 - 100.0 fL   MCH 30.0 26.0 - 34.0 pg   MCHC 33.6 30.0 - 36.0 g/dL   RDW 13.4 11.5 - 15.5 %   Platelets 230 150 - 400 K/uL  Comprehensive metabolic panel     Status: None   Collection Time: 01/21/14 10:18 AM  Result Value Ref Range   Sodium 138 135 - 145 mmol/L    Comment: Please note change in reference range.   Potassium 3.6 3.5 - 5.1 mmol/L    Comment: Please  note change in reference range.   Chloride 100 96 - 112 mEq/L   CO2 29 19 - 32 mmol/L   Glucose, Bld 96 70 - 99 mg/dL   BUN 13 6 - 23 mg/dL   Creatinine, Ser 0.61 0.50 - 1.10 mg/dL   Calcium 9.9 8.4 - 10.5 mg/dL   Total Protein 7.6 6.0 - 8.3 g/dL   Albumin 4.4 3.5 - 5.2 g/dL   AST 27 0 - 37 U/L   ALT 18 0 - 35 U/L   Alkaline Phosphatase 81 39 - 117 U/L   Total Bilirubin 0.6 0.3 - 1.2 mg/dL   GFR calc non Af Amer >90 >90 mL/min   GFR calc Af Amer >90 >90 mL/min    Comment: (NOTE) The eGFR has been calculated using the CKD EPI equation. This calculation has not been validated  in all clinical situations. eGFR's persistently <90 mL/min signify possible Chronic Kidney Disease.    Anion gap 9 5 - 15  Urine Drug Screen     Status: Abnormal   Collection Time: 01/21/14 10:19 AM  Result Value Ref Range   Opiates POSITIVE (A) NONE DETECTED   Cocaine NONE DETECTED NONE DETECTED   Benzodiazepines POSITIVE (A) NONE DETECTED   Amphetamines POSITIVE (A) NONE DETECTED   Tetrahydrocannabinol NONE DETECTED NONE DETECTED   Barbiturates NONE DETECTED NONE DETECTED    Comment:        DRUG SCREEN FOR MEDICAL PURPOSES ONLY.  IF CONFIRMATION IS NEEDED FOR ANY PURPOSE, NOTIFY LAB WITHIN 5 DAYS.        LOWEST DETECTABLE LIMITS FOR URINE DRUG SCREEN Drug Class       Cutoff (ng/mL) Amphetamine      1000 Barbiturate      200 Benzodiazepine   568 Tricyclics       127 Opiates          300 Cocaine          300 THC              50   I-Stat Troponin, ED (not at [facility])     Status: None   Collection Time: 01/21/14 10:35 AM  Result Value Ref Range   Troponin i, poc 0.00 0.00 - 0.08 ng/mL   Comment 3            Comment: Due to the release kinetics of cTnI, a negative result within the first hours of the onset of symptoms does not rule out myocardial infarction with certainty. If myocardial infarction is still suspected, repeat the test at appropriate intervals.   I-stat troponin, ED     Status:  None   Collection Time: 01/21/14 11:10 AM  Result Value Ref Range   Troponin i, poc 0.00 0.00 - 0.08 ng/mL   Comment 3            Comment: Due to the release kinetics of cTnI, a negative result within the first hours of the onset of symptoms does not rule out myocardial infarction with certainty. If myocardial infarction is still suspected, repeat the test at appropriate intervals.    Labs are reviewed and are pertinent for UDS + for benzo, coc, THC. BAL negative.  No current facility-administered medications for this encounter.    Psychiatric Specialty Exam:     Blood pressure 132/89, pulse 71, temperature 97.5 F (36.4 C), temperature source Oral, resp. rate 16, height 4' 9"  (1.448 m), weight 43.092 kg (95 lb), last menstrual period 12/21/2013, SpO2 100 %, not currently breastfeeding.Body mass index is 20.55 kg/(m^2).  General Appearance: Casual and Disheveled  Eye Contact::  Good  Speech:  Clear and Coherent and Normal Rate  Volume:  Normal  Mood:  Anxious  Affect:  Appropriate and Congruent  Thought Process:  Coherent and Goal Directed  Orientation:  Full (Time, Place, and Person)  Thought Content:  WDL  Suicidal Thoughts:  No  Homicidal Thoughts:  No  Memory:  Immediate;   Fair Recent;   Fair Remote;   Fair  Judgement:  Fair  Insight:  Fair  Psychomotor Activity:  Normal  Concentration:  Good  Recall:  Good  Fund of Knowledge:Good  Language: Good  Akathisia:  No  Handed:    AIMS (if indicated):     Assets:  Communication Skills Desire for Improvement Resilience  Sleep:      Musculoskeletal:  Strength & Muscle Tone: within normal limits Gait & Station: normal Patient leans: N/A  Treatment Plan Summary: -Spend the night in the [facility] OBS Unit for safety and stabilization -Clonidine protocol   Benjamine Mola, FNP-BC 01/21/2014 2:45 PM

## 2014-01-22 NOTE — H&P (Deleted)
[facility] OBS UNIT DISCHARGE SUMMARY   Alexis Avery is an 33 y.o. Female.  Total Time spent with patient: 25 minutes   Assessment: AXIS I:  Alcohol Abuse, Major Depression, Recurrent severe, Substance Abuse and Substance Induced Mood Disorder AXIS II:  Deferred AXIS III:   Past Medical History  Diagnosis Date  . Depression   . Preterm labor   . Anxiety   . Pregnant 10/24/2012  . Nausea and vomiting in pregnancy 10/24/2012  . Nicotine addiction 10/24/2012  . Gestational diabetes mellitus, antepartum   . Contraceptive education 06/07/2013   AXIS IV:  other psychosocial or environmental problems and problems related to social environment AXIS V:  51-60 moderate symptoms  Plan:  No evidence of imminent risk to self or others at present.   Supportive therapy provided about ongoing stressors. Discussed crisis plan, support from social network, calling 911, coming to the Emergency Department, and calling Suicide Hotline.   Subjective:   Alexis Avery is a 33 y.o. female patient admitted with reports that she would like to detox from alcohol, benzodiazepines, opiates, and cocaine. UDS was positive for all of these but cocaine. Pt continues to deny SI, HI, and AVH. Pt would like outpatient resources to follow-up with a strong preference for IOP at [facility] if possible. Unfortunately, pt was not able to be approved for IOP and she will seek follow-up with outpatient resources on her own after discussion with TTS staff.    HPI:  Alexis Avery is an 33 y.o. female with a history of depression. Patient presents to [facility] today requesting detox. She is using alcohol, benzodiazepines, opiates, and crack cocaine. SEE ADDITIONAL SOCIAL HISTORY for details related to substance abuse. Patient reports Patient states that she has been using all these substances for several years. She reports heavy use over the weekend. Sts that consequences of her use include relational and financial. She states that she  recently had a child, and wants to change her life. Patient denies SI, HI, and AVH's. She reports self mutilating behaviors of picking her skin. Patient increasingly depressed with hopelessness and loss of interest in usual pleasures.   HPI Elements:   Location:  Psychiatric. Quality:  Stable. Severity:  Moderate. Timing:  Constant. Duration:  Intermittent. Context:  Exacerbation of underlying substance abuse with relapse requiring observation for safety and stabilization.  Past Psychiatric History: Past Medical History  Diagnosis Date  . Depression   . Preterm labor   . Anxiety   . Pregnant 10/24/2012  . Nausea and vomiting in pregnancy 10/24/2012  . Nicotine addiction 10/24/2012  . Gestational diabetes mellitus, antepartum   . Contraceptive education 06/07/2013    reports that she has been smoking Cigarettes.  She has been smoking about 0.50 packs per day. She has never used smokeless tobacco. She reports that she drinks alcohol. She reports that she does not use illicit drugs. Family History  Problem Relation Age of Onset  . Hypertension Mother   . Cancer Maternal Grandmother     breast  . Hypertension Maternal Uncle   . Heart attack Maternal Uncle   . Heart attack Father      Living Arrangements: Parent   Abuse/Neglect ([facility]) Physical Abuse: Denies Verbal Abuse: Denies Sexual Abuse: Denies Allergies:  No Known Allergies  ACT Assessment Complete:  Yes:    Educational Status    Risk to Self: Risk to self with the past 6 months Is patient at risk for suicide?: No Substance abuse history and/or treatment for substance abuse?:   Yes  Risk to Others:    Abuse: Abuse/Neglect Assessment (Assessment to be complete while patient is alone) Physical Abuse: Denies Verbal Abuse: Denies Sexual Abuse: Denies Exploitation of patient/patient's resources: Denies Self-Neglect: Denies  Prior Inpatient Therapy:    Prior Outpatient Therapy:    Additional Information:        Objective:  Results for orders placed or performed during the hospital encounter of 01/21/14 (from the past 72 hour(s))  Acetaminophen level     Status: Abnormal   Collection Time: 01/21/14 10:16 AM  Result Value Ref Range   Acetaminophen (Tylenol), Serum <10.0 (L) 10 - 30 ug/mL    Comment:        THERAPEUTIC CONCENTRATIONS VARY SIGNIFICANTLY. A RANGE OF 10-30 ug/mL MAY BE AN EFFECTIVE CONCENTRATION FOR MANY PATIENTS. HOWEVER, SOME ARE BEST TREATED AT CONCENTRATIONS OUTSIDE THIS RANGE. ACETAMINOPHEN CONCENTRATIONS >150 ug/mL AT 4 HOURS AFTER INGESTION AND >50 ug/mL AT 12 HOURS AFTER INGESTION ARE OFTEN ASSOCIATED WITH TOXIC REACTIONS.   Ethanol (ETOH)     Status: None   Collection Time: 01/21/14 10:16 AM  Result Value Ref Range   Alcohol, Ethyl (B) <5 0 - 9 mg/dL    Comment:        LOWEST DETECTABLE LIMIT FOR SERUM ALCOHOL IS 11 mg/dL FOR MEDICAL PURPOSES ONLY   Salicylate level     Status: None   Collection Time: 01/21/14 10:16 AM  Result Value Ref Range   Salicylate Lvl 7.7 2.8 - 20.0 mg/dL  CBC     Status: None   Collection Time: 01/21/14 10:18 AM  Result Value Ref Range   WBC 9.6 4.0 - 10.5 K/uL   RBC 4.40 3.87 - 5.11 MIL/uL   Hemoglobin 13.2 12.0 - 15.0 g/dL   HCT 39.3 36.0 - 46.0 %   MCV 89.3 78.0 - 100.0 fL   MCH 30.0 26.0 - 34.0 pg   MCHC 33.6 30.0 - 36.0 g/dL   RDW 13.4 11.5 - 15.5 %   Platelets 230 150 - 400 K/uL  Comprehensive metabolic panel     Status: None   Collection Time: 01/21/14 10:18 AM  Result Value Ref Range   Sodium 138 135 - 145 mmol/L    Comment: Please note change in reference range.   Potassium 3.6 3.5 - 5.1 mmol/L    Comment: Please note change in reference range.   Chloride 100 96 - 112 mEq/L   CO2 29 19 - 32 mmol/L   Glucose, Bld 96 70 - 99 mg/dL   BUN 13 6 - 23 mg/dL   Creatinine, Ser 0.61 0.50 - 1.10 mg/dL   Calcium 9.9 8.4 - 10.5 mg/dL   Total Protein 7.6 6.0 - 8.3 g/dL   Albumin 4.4 3.5 - 5.2 g/dL   AST 27 0 - 37  U/L   ALT 18 0 - 35 U/L   Alkaline Phosphatase 81 39 - 117 U/L   Total Bilirubin 0.6 0.3 - 1.2 mg/dL   GFR calc non Af Amer >90 >90 mL/min   GFR calc Af Amer >90 >90 mL/min    Comment: (NOTE) The eGFR has been calculated using the CKD EPI equation. This calculation has not been validated in all clinical situations. eGFR's persistently <90 mL/min signify possible Chronic Kidney Disease.    Anion gap 9 5 - 15  Urine Drug Screen     Status: Abnormal   Collection Time: 01/21/14 10:19 AM  Result Value Ref Range   Opiates POSITIVE (A) NONE DETECTED  Cocaine NONE DETECTED NONE DETECTED   Benzodiazepines POSITIVE (A) NONE DETECTED   Amphetamines POSITIVE (A) NONE DETECTED   Tetrahydrocannabinol NONE DETECTED NONE DETECTED   Barbiturates NONE DETECTED NONE DETECTED    Comment:        DRUG SCREEN FOR MEDICAL PURPOSES ONLY.  IF CONFIRMATION IS NEEDED FOR ANY PURPOSE, NOTIFY LAB WITHIN 5 DAYS.        LOWEST DETECTABLE LIMITS FOR URINE DRUG SCREEN Drug Class       Cutoff (ng/mL) Amphetamine      1000 Barbiturate      200 Benzodiazepine   096 Tricyclics       045 Opiates          300 Cocaine          300 THC              50   I-Stat Troponin, ED (not at [facility])     Status: None   Collection Time: 01/21/14 10:35 AM  Result Value Ref Range   Troponin i, poc 0.00 0.00 - 0.08 ng/mL   Comment 3            Comment: Due to the release kinetics of cTnI, a negative result within the first hours of the onset of symptoms does not rule out myocardial infarction with certainty. If myocardial infarction is still suspected, repeat the test at appropriate intervals.   I-stat troponin, ED     Status: None   Collection Time: 01/21/14 11:10 AM  Result Value Ref Range   Troponin i, poc 0.00 0.00 - 0.08 ng/mL   Comment 3            Comment: Due to the release kinetics of cTnI, a negative result within the first hours of the onset of symptoms does not rule out myocardial infarction with  certainty. If myocardial infarction is still suspected, repeat the test at appropriate intervals.    Labs are reviewed and are pertinent for UDS + for benzo, coc, THC. BAL negative.  No current facility-administered medications for this encounter.    Psychiatric Specialty Exam:     BP 140/86 mmHg  Pulse 64  Temp(Src) 98 F (36.7 C) (Oral)  Resp 16  Ht 4' 9" (1.448 m)  Wt 43.092 kg (95 lb)  BMI 20.55 kg/m2  SpO2 100%  LMP 12/21/2013   General Appearance: Casual and Disheveled  Eye Contact::  Good  Speech:  Clear and Coherent and Normal Rate  Volume:  Normal  Mood:  Anxious  Affect:  Appropriate and Congruent  Thought Process:  Coherent and Goal Directed  Orientation:  Full (Time, Place, and Person)  Thought Content:  WDL  Suicidal Thoughts:  No  Homicidal Thoughts:  No  Memory:  Immediate;   Fair Recent;   Fair Remote;   Fair  Judgement:  Fair  Insight:  Fair  Psychomotor Activity:  Normal  Concentration:  Good  Recall:  Good  Fund of Knowledge:Good  Language: Good  Akathisia:  No  Handed:    AIMS (if indicated):     Assets:  Communication Skills Desire for Improvement Resilience  Sleep:      Musculoskeletal: Strength & Muscle Tone: within normal limits Gait & Station: normal Patient leans: N/A  Treatment Plan Summary: -Discharge home with resources for outpatient followup -14 day scripts for Neurontin and Vistaril.   Benjamine Mola, FNP-BC 01/22/2014 10:13 AM

## 2014-01-22 NOTE — BH Assessment (Signed)
Consulted with Conrad,NP and Dr.Kumar whom is recommending that patient be provided with outpatient resources for Mental Health, Substance Abuse, and [facility] information to address pcp needs. Pt was provided with the resources noted above as well as crisis resources to be used as needed. Pt was provided with bus passes and declined them as she reports  she has reliable transportation  Per Renata Capriceonrad and Dr.Kumar D/C recommended.  Glorious PeachNajah Wylie Coon, MS, LCASA Assessment Counselor

## 2014-01-22 NOTE — Progress Notes (Signed)
Patient ID: Alexis Avery, female   DOB: May 17, 1980, 34 y.o.   MRN: [id number] Discharge Note-Seen by Renata Capriceonrad NP this am and it has been determined that Alexis Avery can be discharged today to home. Alexis Avery declined the rehab program that had been arranged for her because it was too structured and [place]. [facility] not [facility] or [facility]. Open to intensive outpatient but without health care unable to attend. SW provided her with referral to [facility] for follow up care and [facility] for any medical concerns and they also have a behavioral health coordinator there.Reveiwed with her discharge plans and Alexis Avery is verbalizing her understanding. Alexis Avery called a female friend for transportation. All property returned to her.Was given two prescriptions, one for Atarax and the other for Neurontin. Currently Alexis Avery is getting her medications from her OB/GYN.Expressed no concerns on her way out.

## 2014-01-22 NOTE — Discharge Summary (Signed)
[facility] OBS UNIT DISCHARGE SUMMARY   Alexis Avery is an 33 y.o. Female.  Total Time spent with patient: 25 minutes   Assessment: AXIS I:  Alcohol Abuse, Major Depression, Recurrent severe, Substance Abuse and Substance Induced Mood Disorder AXIS II:  Deferred AXIS III:   Past Medical History  Diagnosis Date  . Depression   . Preterm labor   . Anxiety   . Pregnant 10/24/2012  . Nausea and vomiting in pregnancy 10/24/2012  . Nicotine addiction 10/24/2012  . Gestational diabetes mellitus, antepartum   . Contraceptive education 06/07/2013   AXIS IV:  other psychosocial or environmental problems and problems related to social environment AXIS V:  51-60 moderate symptoms  Plan:  No evidence of imminent risk to self or others at present.   Supportive therapy provided about ongoing stressors. Discussed crisis plan, support from social network, calling 911, coming to the Emergency Department, and calling Suicide Hotline.   Subjective:   Alexis Avery is a 33 y.o. female patient admitted with reports that she would like to detox from alcohol, benzodiazepines, opiates, and cocaine. UDS was positive for all of these but cocaine. Pt continues to deny SI, HI, and AVH. Pt would like outpatient resources to follow-up with a strong preference for IOP at [facility] if possible. Unfortunately, pt was not able to be approved for IOP and she will seek follow-up with outpatient resources on her own after discussion with TTS staff.    HPI:  Alexis Avery is an 33 y.o. female with a history of depression. Patient presents to [facility] today requesting detox. She is using alcohol, benzodiazepines, opiates, and crack cocaine. SEE ADDITIONAL SOCIAL HISTORY for details related to substance abuse. Patient reports Patient states that she has been using all these substances for several years. She reports heavy use over the weekend. Sts that consequences of her use include relational and financial. She states that she  recently had a child, and wants to change her life. Patient denies SI, HI, and AVH's. She reports self mutilating behaviors of picking her skin. Patient increasingly depressed with hopelessness and loss of interest in usual pleasures.   HPI Elements:   Location:  Psychiatric. Quality:  Stable. Severity:  Moderate. Timing:  Constant. Duration:  Intermittent. Context:  Exacerbation of underlying substance abuse with relapse requiring observation for safety and stabilization.  Past Psychiatric History: Past Medical History  Diagnosis Date  . Depression   . Preterm labor   . Anxiety   . Pregnant 10/24/2012  . Nausea and vomiting in pregnancy 10/24/2012  . Nicotine addiction 10/24/2012  . Gestational diabetes mellitus, antepartum   . Contraceptive education 06/07/2013    reports that she has been smoking Cigarettes.  She has been smoking about 0.50 packs per day. She has never used smokeless tobacco. She reports that she drinks alcohol. She reports that she does not use illicit drugs. Family History  Problem Relation Age of Onset  . Hypertension Mother   . Cancer Maternal Grandmother     breast  . Hypertension Maternal Uncle   . Heart attack Maternal Uncle   . Heart attack Father      Living Arrangements: Parent   Abuse/Neglect ([facility]) Physical Abuse: Denies Verbal Abuse: Denies Sexual Abuse: Denies Allergies:  No Known Allergies  ACT Assessment Complete:  Yes:    Educational Status    Risk to Self: Risk to self with the past 6 months Is patient at risk for suicide?: No Substance abuse history and/or treatment for substance abuse?:   Yes  Risk to Others:    Abuse: Abuse/Neglect Assessment (Assessment to be complete while patient is alone) Physical Abuse: Denies Verbal Abuse: Denies Sexual Abuse: Denies Exploitation of patient/patient's resources: Denies Self-Neglect: Denies  Prior Inpatient Therapy:    Prior Outpatient Therapy:    Additional Information:        Objective:  Results for orders placed or performed during the hospital encounter of 01/21/14 (from the past 72 hour(s))  Acetaminophen level     Status: Abnormal   Collection Time: 01/21/14 10:16 AM  Result Value Ref Range   Acetaminophen (Tylenol), Serum <10.0 (L) 10 - 30 ug/mL    Comment:        THERAPEUTIC CONCENTRATIONS VARY SIGNIFICANTLY. A RANGE OF 10-30 ug/mL MAY BE AN EFFECTIVE CONCENTRATION FOR MANY PATIENTS. HOWEVER, SOME ARE BEST TREATED AT CONCENTRATIONS OUTSIDE THIS RANGE. ACETAMINOPHEN CONCENTRATIONS >150 ug/mL AT 4 HOURS AFTER INGESTION AND >50 ug/mL AT 12 HOURS AFTER INGESTION ARE OFTEN ASSOCIATED WITH TOXIC REACTIONS.   Ethanol (ETOH)     Status: None   Collection Time: 01/21/14 10:16 AM  Result Value Ref Range   Alcohol, Ethyl (B) <5 0 - 9 mg/dL    Comment:        LOWEST DETECTABLE LIMIT FOR SERUM ALCOHOL IS 11 mg/dL FOR MEDICAL PURPOSES ONLY   Salicylate level     Status: None   Collection Time: 01/21/14 10:16 AM  Result Value Ref Range   Salicylate Lvl 7.7 2.8 - 20.0 mg/dL  CBC     Status: None   Collection Time: 01/21/14 10:18 AM  Result Value Ref Range   WBC 9.6 4.0 - 10.5 K/uL   RBC 4.40 3.87 - 5.11 MIL/uL   Hemoglobin 13.2 12.0 - 15.0 g/dL   HCT 39.3 36.0 - 46.0 %   MCV 89.3 78.0 - 100.0 fL   MCH 30.0 26.0 - 34.0 pg   MCHC 33.6 30.0 - 36.0 g/dL   RDW 13.4 11.5 - 15.5 %   Platelets 230 150 - 400 K/uL  Comprehensive metabolic panel     Status: None   Collection Time: 01/21/14 10:18 AM  Result Value Ref Range   Sodium 138 135 - 145 mmol/L    Comment: Please note change in reference range.   Potassium 3.6 3.5 - 5.1 mmol/L    Comment: Please note change in reference range.   Chloride 100 96 - 112 mEq/L   CO2 29 19 - 32 mmol/L   Glucose, Bld 96 70 - 99 mg/dL   BUN 13 6 - 23 mg/dL   Creatinine, Ser 0.61 0.50 - 1.10 mg/dL   Calcium 9.9 8.4 - 10.5 mg/dL   Total Protein 7.6 6.0 - 8.3 g/dL   Albumin 4.4 3.5 - 5.2 g/dL   AST 27 0 - 37  U/L   ALT 18 0 - 35 U/L   Alkaline Phosphatase 81 39 - 117 U/L   Total Bilirubin 0.6 0.3 - 1.2 mg/dL   GFR calc non Af Amer >90 >90 mL/min   GFR calc Af Amer >90 >90 mL/min    Comment: (NOTE) The eGFR has been calculated using the CKD EPI equation. This calculation has not been validated in all clinical situations. eGFR's persistently <90 mL/min signify possible Chronic Kidney Disease.    Anion gap 9 5 - 15  Urine Drug Screen     Status: Abnormal   Collection Time: 01/21/14 10:19 AM  Result Value Ref Range   Opiates POSITIVE (A) NONE DETECTED     Cocaine NONE DETECTED NONE DETECTED   Benzodiazepines POSITIVE (A) NONE DETECTED   Amphetamines POSITIVE (A) NONE DETECTED   Tetrahydrocannabinol NONE DETECTED NONE DETECTED   Barbiturates NONE DETECTED NONE DETECTED    Comment:        DRUG SCREEN FOR MEDICAL PURPOSES ONLY.  IF CONFIRMATION IS NEEDED FOR ANY PURPOSE, NOTIFY LAB WITHIN 5 DAYS.        LOWEST DETECTABLE LIMITS FOR URINE DRUG SCREEN Drug Class       Cutoff (ng/mL) Amphetamine      1000 Barbiturate      200 Benzodiazepine   200 Tricyclics       300 Opiates          300 Cocaine          300 THC              50   I-Stat Troponin, ED (not at [facility])     Status: None   Collection Time: 01/21/14 10:35 AM  Result Value Ref Range   Troponin i, poc 0.00 0.00 - 0.08 ng/mL   Comment 3            Comment: Due to the release kinetics of cTnI, a negative result within the first hours of the onset of symptoms does not rule out myocardial infarction with certainty. If myocardial infarction is still suspected, repeat the test at appropriate intervals.   I-stat troponin, ED     Status: None   Collection Time: 01/21/14 11:10 AM  Result Value Ref Range   Troponin i, poc 0.00 0.00 - 0.08 ng/mL   Comment 3            Comment: Due to the release kinetics of cTnI, a negative result within the first hours of the onset of symptoms does not rule out myocardial infarction with  certainty. If myocardial infarction is still suspected, repeat the test at appropriate intervals.    Labs are reviewed and are pertinent for UDS + for benzo, coc, THC. BAL negative.  No current facility-administered medications for this encounter.    Psychiatric Specialty Exam:     BP 140/86 mmHg  Pulse 64  Temp(Src) 98 F (36.7 C) (Oral)  Resp 16  Ht 4' 9" (1.448 m)  Wt 43.092 kg (95 lb)  BMI 20.55 kg/m2  SpO2 100%  LMP 12/21/2013   General Appearance: Casual and Disheveled  Eye Contact::  Good  Speech:  Clear and Coherent and Normal Rate  Volume:  Normal  Mood:  Anxious  Affect:  Appropriate and Congruent  Thought Process:  Coherent and Goal Directed  Orientation:  Full (Time, Place, and Person)  Thought Content:  WDL  Suicidal Thoughts:  No  Homicidal Thoughts:  No  Memory:  Immediate;   Fair Recent;   Fair Remote;   Fair  Judgement:  Fair  Insight:  Fair  Psychomotor Activity:  Normal  Concentration:  Good  Recall:  Good  Fund of Knowledge:Good  Language: Good  Akathisia:  No  Handed:    AIMS (if indicated):     Assets:  Communication Skills Desire for Improvement Resilience  Sleep:      Musculoskeletal: Strength & Muscle Tone: within normal limits Gait & Station: normal Patient leans: N/A  Treatment Plan Summary: -Discharge home with resources for outpatient followup -14 day scripts for Neurontin and Vistaril.   Alexis Avery C, FNP-BC 01/22/2014 10:13 AM  

## 2014-07-07 ENCOUNTER — Other Ambulatory Visit: Payer: Self-pay

## 2014-10-27 ENCOUNTER — Emergency Department (HOSPITAL_COMMUNITY)
Admission: EM | Admit: 2014-10-27 | Discharge: 2014-10-27 | Disposition: A | Discharge status: Home / Self Care | Payer: Medicaid Other

## 2014-10-27 NOTE — ED Notes (Signed)
Attempted to call pt to triage room, however no answer from the waiting room.

## 2014-10-28 ENCOUNTER — Emergency Department (HOSPITAL_COMMUNITY)
Admission: EM | Admit: 2014-10-28 | Discharge: 2014-10-28 | Disposition: A | Discharge status: Home / Self Care | Payer: BLUE CROSS/BLUE SHIELD | Attending: Emergency Medicine | Admitting: Emergency Medicine

## 2014-10-28 ENCOUNTER — Encounter (HOSPITAL_COMMUNITY): Payer: Self-pay | Admitting: Emergency Medicine

## 2014-10-28 DIAGNOSIS — N12 Tubulo-interstitial nephritis, not specified as acute or chronic: Secondary | ICD-10-CM | POA: Insufficient documentation

## 2014-10-28 DIAGNOSIS — F329 Major depressive disorder, single episode, unspecified: Secondary | ICD-10-CM | POA: Insufficient documentation

## 2014-10-28 DIAGNOSIS — Z8632 Personal history of gestational diabetes: Secondary | ICD-10-CM | POA: Insufficient documentation

## 2014-10-28 DIAGNOSIS — Z720 Tobacco use: Secondary | ICD-10-CM | POA: Diagnosis not present

## 2014-10-28 DIAGNOSIS — Z8751 Personal history of pre-term labor: Secondary | ICD-10-CM | POA: Insufficient documentation

## 2014-10-28 DIAGNOSIS — Z3202 Encounter for pregnancy test, result negative: Secondary | ICD-10-CM | POA: Insufficient documentation

## 2014-10-28 DIAGNOSIS — R102 Pelvic and perineal pain: Secondary | ICD-10-CM | POA: Diagnosis present

## 2014-10-28 DIAGNOSIS — F419 Anxiety disorder, unspecified: Secondary | ICD-10-CM | POA: Insufficient documentation

## 2014-10-28 LAB — COMPREHENSIVE METABOLIC PANEL
ALT: 14 U/L (ref 14–54)
AST: 27 U/L (ref 15–41)
Albumin: 4.4 g/dL (ref 3.5–5.0)
Alkaline Phosphatase: 61 U/L (ref 38–126)
Anion gap: 8 (ref 5–15)
BUN: 26 mg/dL — ABNORMAL HIGH (ref 6–20)
CHLORIDE: 107 mmol/L (ref 101–111)
CO2: 25 mmol/L (ref 22–32)
Calcium: 9.4 mg/dL (ref 8.9–10.3)
Creatinine, Ser: 0.8 mg/dL (ref 0.44–1.00)
GFR calc non Af Amer: >60 mL/min (ref >60)
Glucose, Bld: 80 mg/dL (ref 65–99)
Potassium: 3.7 mmol/L (ref 3.5–5.1)
SODIUM: 140 mmol/L (ref 135–145)
TOTAL PROTEIN: 7.5 g/dL (ref 6.5–8.1)
Total Bilirubin: 0.7 mg/dL (ref 0.3–1.2)

## 2014-10-28 LAB — CBC
HEMATOCRIT: 42.1 % (ref 36.0–46.0)
HEMOGLOBIN: 14.2 g/dL (ref 12.0–15.0)
MCH: 30.8 pg (ref 26.0–34.0)
MCHC: 33.7 g/dL (ref 30.0–36.0)
MCV: 91.3 fL (ref 78.0–100.0)
Platelets: 275 10*3/uL (ref 150–400)
RBC: 4.61 MIL/uL (ref 3.87–5.11)
RDW: 12.9 % (ref 11.5–15.5)
WBC: 7.4 10*3/uL (ref 4.0–10.5)

## 2014-10-28 LAB — POC URINE PREG, ED: PREG TEST UR: NEGATIVE

## 2014-10-28 LAB — URINALYSIS, ROUTINE W REFLEX MICROSCOPIC
Glucose, UA: 100 mg/dL — AB
Ketones, ur: 15 mg/dL — AB
Nitrite: POSITIVE — AB
Protein, ur: >300 mg/dL — AB
SPECIFIC GRAVITY, URINE: 1.033 — AB (ref 1.005–1.030)
UROBILINOGEN UA: 2 mg/dL — AB (ref 0.0–1.0)
pH: 6 (ref 5.0–8.0)

## 2014-10-28 LAB — URINE MICROSCOPIC-ADD ON

## 2014-10-28 MED ORDER — DEXTROSE 5 % IV SOLN
1.0000 g | Freq: Once | INTRAVENOUS | Status: AC
  Administered 2014-10-28: 1 g via INTRAVENOUS
  Filled 2014-10-28: qty 10

## 2014-10-28 MED ORDER — ONDANSETRON HCL 4 MG/2ML IJ SOLN
4.0000 mg | Freq: Once | INTRAMUSCULAR | Status: AC
  Administered 2014-10-28: 4 mg via INTRAVENOUS
  Filled 2014-10-28: qty 2

## 2014-10-28 MED ORDER — FLUCONAZOLE 150 MG PO TABS
150.0000 mg | ORAL_TABLET | Freq: Once | ORAL | Status: DC
Start: 2014-10-28 — End: 2015-12-19

## 2014-10-28 MED ORDER — SODIUM CHLORIDE 0.9 % IV BOLUS (SEPSIS)
1000.0000 mL | Freq: Once | INTRAVENOUS | Status: AC
  Administered 2014-10-28: 1000 mL via INTRAVENOUS

## 2014-10-28 MED ORDER — KETOROLAC TROMETHAMINE 30 MG/ML IJ SOLN
30.0000 mg | Freq: Once | INTRAMUSCULAR | Status: AC
  Administered 2014-10-28: 30 mg via INTRAVENOUS
  Filled 2014-10-28: qty 1

## 2014-10-28 MED ORDER — CEPHALEXIN 500 MG PO CAPS: 500.0000 mg | ORAL_CAPSULE | Freq: Two times a day (BID) | ORAL | Status: DC

## 2014-10-28 NOTE — ED Notes (Signed)
Per pt, states lower pelvic pain radiating to back, states painful urination

## 2014-10-28 NOTE — Discharge Instructions (Signed)

## 2014-10-28 NOTE — ED Provider Notes (Signed)
CSN: [id number]     Arrival date & time 10/28/14  0744 History   First MD Initiated Contact with Patient 10/28/14 [phone]     Chief Complaint  Patient presents with  . back/pelvic pain      (Consider location/radiation/quality/duration/timing/severity/associated sxs/prior Treatment) HPI Comments: 34 year old female with past medical history including anxiety/depression who presents with pelvic pain and dysuria. Patient states that 3 days ago she began having lower pelvic pain which is in a band across her lower abdomen. The pain radiates to her back on both sides. She has never had these symptoms before. She endorses associated dysuria as well as hematuria. No vaginal bleeding or discharge. She has had nausea but no fevers, vomiting, diarrhea, or blood in her stool. No chest pain, shortness of breath or cough/cold symptoms. No unprotected sex.  The history is provided by the patient.    Past Medical History  Diagnosis Date  . Depression   . Preterm labor   . Anxiety   . Pregnant 10/24/2012  . Nausea and vomiting in pregnancy 10/24/2012  . Nicotine addiction 10/24/2012  . Gestational diabetes mellitus, antepartum   . Contraceptive education 06/07/2013   Past Surgical History  Procedure Laterality Date  . Cholecystectomy    . Tonsillectomy    . Vaginal delivery      X 1   Family History  Problem Relation Age of Onset  . Hypertension Mother   . Cancer Maternal Grandmother     breast  . Hypertension Maternal Uncle   . Heart attack Maternal Uncle   . Heart attack Father    Social History  Substance Use Topics  . Smoking status: Current Every Day Smoker -- 0.50 packs/day    Types: Cigarettes  . Smokeless tobacco: Never Used  . Alcohol Use: Yes   OB History    Gravida Para Term Preterm AB TAB SAB Ectopic Multiple Living   Review of Systems  10 Systems reviewed and are negative for acute change except as noted in the HPI.   Allergies  Review of  patient's allergies indicates no known allergies.  Home Medications   Prior to Admission medications   Medication Sig Start Date End Date Taking? Authorizing Provider  ibuprofen (ADVIL,MOTRIN) 200 MG tablet Take 800 mg by mouth every 4 (four) hours as needed for fever, headache, mild pain, moderate pain or cramping.    Yes Historical Provider, MD  gabapentin (NEURONTIN) 100 MG capsule Take 1 capsule (100 mg total) by mouth 3 (three) times daily. Patient not taking: Reported on 10/28/2014 01/22/14   Beau Fanny, FNP  hydrOXYzine (ATARAX/VISTARIL) 25 MG tablet Take 1 tablet (25 mg total) by mouth every 6 (six) hours as needed for anxiety. Patient not taking: Reported on 10/28/2014 01/22/14   Everardo All Withrow, FNP   BP 102/70 mmHg  Pulse 81  Temp(Src) 97.6 F (36.4 C) (Oral)  Resp 18  SpO2 100%  LMP 09/28/2014 Physical Exam  Constitutional: She is oriented to person, place, and time. She appears well-developed and well-nourished. No distress.  HENT:  Head: Normocephalic and atraumatic.  Moist mucous membranes  Eyes: Conjunctivae are normal. Pupils are equal, round, and reactive to light.  Neck: Neck supple.  Cardiovascular: Normal rate, regular rhythm and normal heart sounds.   No murmur heard. Pulmonary/Chest: Effort normal and breath sounds normal.  Abdominal: Soft. Bowel sounds are normal. She exhibits no distension.  Tenderness to palpation  of suprapubic abdomen and left lower quadrant with no rebound or guarding  Genitourinary:  No CVA tenderness  Musculoskeletal: She exhibits no edema.  Neurological: She is alert and oriented to person, place, and time.  Fluent speech  Skin: Skin is warm and dry.  Psychiatric: She has a normal mood and affect. Judgment normal.  Nursing note and vitals reviewed.   ED Course  Procedures (including critical care time) Labs Review Labs Reviewed  COMPREHENSIVE METABOLIC PANEL - Abnormal; Notable for the following:    BUN 26 (*)    All  other components within normal limits  URINALYSIS, ROUTINE W REFLEX MICROSCOPIC (NOT AT [facility]) - Abnormal; Notable for the following:    Color, Urine RED (*)    APPearance CLOUDY (*)    Specific Gravity, Urine 1.033 (*)    Glucose, UA 100 (*)    Hgb urine dipstick LARGE (*)    Bilirubin Urine MODERATE (*)    Ketones, ur 15 (*)    Protein, ur >300 (*)    Urobilinogen, UA 2.0 (*)    Nitrite POSITIVE (*)    Leukocytes, UA MODERATE (*)    All other components within normal limits  CBC  URINE MICROSCOPIC-ADD ON  POC URINE PREG, ED    Imaging Review No results found. I have personally reviewed and evaluated these lab results as part of my medical decision-making.   EKG Interpretation None     Medications  cefTRIAXone (ROCEPHIN) 1 g in dextrose 5 % 50 mL IVPB (1 g Intravenous New Bag/Given 10/28/14 0913)  sodium chloride 0.9 % bolus 1,000 mL (1,000 mLs Intravenous New Bag/Given 10/28/14 0913)  ondansetron (ZOFRAN) injection 4 mg (4 mg Intravenous Given 10/28/14 0913)  ketorolac (TORADOL) 30 MG/ML injection 30 mg (30 mg Intravenous Given 10/28/14 0913)    MDM   Final diagnoses:  Pyelonephritis    34 year old female who presents with 3 days of suprapubic abdominal pain radiating to her back associated with dysuria and hematuria. Patient uncomfortable but well-appearing at presentation. Vital signs unremarkable. She had suprapubic and left lower quadrant tenderness on exam with no focal right lower quadrant tenderness. Obtained above labs which showed UA with leukocytes and nitrite positive, large amount of blood. Symptoms are consistent with pyelonephritis and patient denies any vaginal bleeding or discharge or unprotected sex to suggest PID. No unilateral CVA tenderness to suggest kidney stone. Gave IV fluid bolus, Zofran, Toradol, and Rocephin. Instructed on supportive care and provided with prescription for Keflex. I reviewed return precautions including any worsening symptoms,  fevers, intractable vomiting. I feel that appendicitis is very unlikely but I did counsel the patient on any warning signs of appendicitis. Patient voiced understanding and was discharged in satisfactory condition.  Laurence Spatesachel Morgan Little, MD 10/28/14 [phone]

## 2015-03-08 ENCOUNTER — Emergency Department (HOSPITAL_COMMUNITY)
Admission: EM | Admit: 2015-03-08 | Discharge: 2015-03-08 | Disposition: A | Discharge status: Home / Self Care | Payer: BLUE CROSS/BLUE SHIELD | Attending: Emergency Medicine | Admitting: Emergency Medicine

## 2015-03-08 ENCOUNTER — Encounter (HOSPITAL_COMMUNITY): Payer: Self-pay

## 2015-03-08 DIAGNOSIS — F17210 Nicotine dependence, cigarettes, uncomplicated: Secondary | ICD-10-CM | POA: Insufficient documentation

## 2015-03-08 DIAGNOSIS — H9201 Otalgia, right ear: Secondary | ICD-10-CM | POA: Diagnosis present

## 2015-03-08 DIAGNOSIS — Z8659 Personal history of other mental and behavioral disorders: Secondary | ICD-10-CM | POA: Insufficient documentation

## 2015-03-08 DIAGNOSIS — Z792 Long term (current) use of antibiotics: Secondary | ICD-10-CM | POA: Diagnosis not present

## 2015-03-08 DIAGNOSIS — Z8632 Personal history of gestational diabetes: Secondary | ICD-10-CM | POA: Insufficient documentation

## 2015-03-08 DIAGNOSIS — H66014 Acute suppurative otitis media with spontaneous rupture of ear drum, recurrent, right ear: Secondary | ICD-10-CM | POA: Diagnosis not present

## 2015-03-08 DIAGNOSIS — Z8751 Personal history of pre-term labor: Secondary | ICD-10-CM | POA: Insufficient documentation

## 2015-03-08 MED ORDER — AMOXICILLIN 500 MG PO CAPS: 500.0000 mg | ORAL_CAPSULE | Freq: Two times a day (BID) | ORAL | Status: DC

## 2015-03-08 NOTE — Discharge Instructions (Signed)
Please read and follow all provided instructions.  Your diagnoses today include:  1. Recurrent acute suppurative otitis media of right ear with spontaneous rupture of tympanic membrane    Tests performed today include:  Vital signs. See below for your results today.   Medications prescribed:   Take medications as prescribed.   Home care instructions:  Follow any educational materials contained in this packet.  Follow-up instructions: Please follow-up with your primary care provider in the next 48 hours for further evaluation of symptoms and treatment   Return instructions:   Please return to the Emergency Department if you do not get better, if you get worse, or new symptoms OR  - Fever (temperature greater than 101.66F)  - Bleeding that does not stop with holding pressure to the area    -Severe pain (please note that you may be more sore the day after your accident)  - Chest Pain  - Difficulty breathing  - Severe nausea or vomiting  - Inability to tolerate food and liquids  - Passing out  - Skin becoming red around your wounds  - Change in mental status (confusion or lethargy)  - New numbness or weakness     Please return if you have any other emergent concerns.  Additional Information:  Your vital signs today were: BP 129/90 mmHg   Pulse 88   Temp(Src) 98.1 F (36.7 C) (Oral)   Resp 18   SpO2 100% If your blood pressure (BP) was elevated above 135/85 this visit, please have this repeated by your doctor within one month. ---------------

## 2015-03-08 NOTE — ED Notes (Signed)
Pt here with ear pain and bleeding since last week.  No fever.  congestion

## 2015-03-08 NOTE — ED Provider Notes (Signed)
CSN: [id number]     Arrival date & time 03/08/15  4098 History   First MD Initiated Contact with Patient 03/08/15 [phone]     Chief Complaint  Patient presents with  . Otalgia   (Consider location/radiation/quality/duration/timing/severity/associated sxs/prior Treatment) HPI  35 y.o. female presents to the Emergency Department today complaining of right ear pain. Associated cough and sinus congestion. Notes right ear pain has been going on x1 week. Has had blood come of the ear. No pain currently. Notes decrease in hearing. 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 No fevers. No CP/SOB/ABD pain. No N/V/D.  Past Medical History  Diagnosis Date  . Depression   . Preterm labor   . Anxiety   . Pregnant 10/24/2012  . Nausea and vomiting in pregnancy 10/24/2012  . Nicotine addiction 10/24/2012  . Gestational diabetes mellitus, antepartum   . Contraceptive education 06/07/2013   Past Surgical History  Procedure Laterality Date  . Cholecystectomy    . Tonsillectomy    . Vaginal delivery      X 1   Family History  Problem Relation Age of Onset  . Hypertension Mother   . Cancer Maternal Grandmother     breast  . Hypertension Maternal Uncle   . Heart attack Maternal Uncle   . Heart attack Father    Social History  Substance Use Topics  . Smoking status: Current Every Day Smoker -- 0.50 packs/day    Types: Cigarettes  . Smokeless tobacco: Never Used  . Alcohol Use: Yes   OB History    Gravida Para Term Preterm AB TAB SAB Ectopic Multiple Living   Review of Systems ROS reviewed and all are negative for acute change except as noted in the HPI.  Allergies  Review of patient's allergies indicates no known allergies.  Home Medications   Prior to Admission medications   Medication Sig Start Date End Date Taking? Authorizing Provider  cephALEXin (KEFLEX) 500 MG capsule Take 1 capsule (500 mg total) by mouth 2 (two) times daily. 10/28/14   Laurence Spates, MD  fluconazole (DIFLUCAN) 150 MG tablet Take 1 tablet (150 mg total) by mouth once. If you develop symptoms of yeast infection 10/28/14   Laurence Spates, MD  ibuprofen (ADVIL,MOTRIN) 200 MG tablet Take 800 mg by mouth every 4 (four) hours as needed for fever, headache, mild pain, moderate pain or cramping.     Historical Provider, MD   BP 129/90 mmHg  Pulse 88  Temp(Src) 98.1 F (36.7 C) (Oral)  Resp 18  SpO2 100%   Physical Exam  Constitutional: She is oriented to person, place, and time. Vital signs are normal. She appears well-developed and well-nourished.  HENT:  Head: Normocephalic and atraumatic.  Right Ear: External ear and ear canal normal. Tympanic membrane is perforated. Decreased hearing is noted.  Left Ear: Hearing, tympanic membrane, external ear and ear canal normal.  Nose: Nose normal.  Mouth/Throat: Uvula is midline, oropharynx is clear and moist and mucous membranes are normal.  Eyes: Conjunctivae, EOM and lids are normal. Pupils are equal, round, and reactive to light.  Neck: Trachea normal and normal range of motion. Neck supple.  Cardiovascular: Normal rate, regular rhythm, S1 normal, S2 normal, normal heart sounds, intact distal pulses and normal pulses.   Pulmonary/Chest: Effort normal and breath sounds normal. No respiratory distress. She has no wheezes. She has no rales.  Abdominal: Soft.  Normal appearance and bowel sounds are normal. There is no tenderness.  Lymphadenopathy:    She has no cervical adenopathy.  Neurological: She is alert and oriented to person, place, and time.  Skin: Skin is warm and dry.  Psychiatric: She has a normal mood and affect. Her speech is normal and behavior is normal. Thought content normal.  Vitals reviewed.  ED Course  Procedures (including critical care time) Labs Review Labs Reviewed - No data to display  Imaging Review No results found. I have personally reviewed and evaluated these images and lab results  as part of my medical decision-making.   EKG Interpretation None      MDM  I have reviewed the relevant previous healthcare records. I obtained HPI from historian. Patient discussed with supervising physician  ED Course:  Assessment: 40y F presents with right ear pain. Hx of TM perforation. On exam, pt in NAD. VSS. Afebrile. Lungs CTA. Heart RRR. Abdomen nontender/soft. Right ear has TM perforation with blood noted. Decrease in hearing noted. No other symptoms. No pain. Will DC patient with Amoxicillin. Advised to stay away from submerging right ear. Follow up with PCP for further evaluation. At this time, Patient is in no acute distress. Vital Signs are stable. Patient is able to ambulate. Patient able to tolerate PO.   Disposition/Plan:  DC Home Additional Verbal discharge instructions given and discussed with patient.  Pt Instructed to f/u with PCP in the next 48-72 hours for evaluation and treatment of symptoms. Return precautions given Pt acknowledges and agrees with plan  Supervising Physician Azalia Bilis, MD   Final diagnoses:  Recurrent acute suppurative otitis media of right ear with spontaneous rupture of tympanic membrane       Audry Pili, PA-C 03/08/15 1610  Azalia Bilis, MD 03/08/15 [phone]

## 2015-12-05 ENCOUNTER — Other Ambulatory Visit: Payer: Self-pay

## 2015-12-05 ENCOUNTER — Emergency Department (HOSPITAL_COMMUNITY)
Admission: EM | Admit: 2015-12-05 | Discharge: 2015-12-05 | Disposition: A | Discharge status: Home / Self Care | Payer: BLUE CROSS/BLUE SHIELD | Attending: Emergency Medicine | Admitting: Emergency Medicine

## 2015-12-05 ENCOUNTER — Encounter (HOSPITAL_COMMUNITY): Payer: Self-pay | Admitting: *Deleted

## 2015-12-05 DIAGNOSIS — Z79899 Other long term (current) drug therapy: Secondary | ICD-10-CM | POA: Insufficient documentation

## 2015-12-05 DIAGNOSIS — R4020 Unspecified coma: Secondary | ICD-10-CM | POA: Insufficient documentation

## 2015-12-05 DIAGNOSIS — F17210 Nicotine dependence, cigarettes, uncomplicated: Secondary | ICD-10-CM | POA: Insufficient documentation

## 2015-12-05 DIAGNOSIS — T402X1A Poisoning by other opioids, accidental (unintentional), initial encounter: Secondary | ICD-10-CM | POA: Insufficient documentation

## 2015-12-05 DIAGNOSIS — T40601A Poisoning by unspecified narcotics, accidental (unintentional), initial encounter: Secondary | ICD-10-CM

## 2015-12-05 HISTORY — DX: Opioid abuse, uncomplicated: F11.10

## 2015-12-05 LAB — CBC WITH DIFFERENTIAL/PLATELET
BASOS PCT: 1 %
Basophils Absolute: 0.1 10*3/uL (ref 0.0–0.1)
Eosinophils Absolute: 0.2 10*3/uL (ref 0.0–0.7)
Eosinophils Relative: 3 %
HEMATOCRIT: 38.2 % (ref 36.0–46.0)
Hemoglobin: 13.1 g/dL (ref 12.0–15.0)
Lymphocytes Relative: 49 %
Lymphs Abs: 3.4 10*3/uL (ref 0.7–4.0)
MCH: 31.7 pg (ref 26.0–34.0)
MCHC: 34.3 g/dL (ref 30.0–36.0)
MCV: 92.5 fL (ref 78.0–100.0)
MONO ABS: 0.6 10*3/uL (ref 0.1–1.0)
MONOS PCT: 9 %
NEUTROS ABS: 2.6 10*3/uL (ref 1.7–7.7)
Neutrophils Relative %: 38 %
Platelets: 221 10*3/uL (ref 150–400)
RBC: 4.13 MIL/uL (ref 3.87–5.11)
RDW: 12.4 % (ref 11.5–15.5)
WBC: 6.9 10*3/uL (ref 4.0–10.5)

## 2015-12-05 LAB — RAPID URINE DRUG SCREEN, HOSP PERFORMED
Amphetamines: POSITIVE — AB
BARBITURATES: NOT DETECTED
Benzodiazepines: NOT DETECTED
COCAINE: POSITIVE — AB
Opiates: POSITIVE — AB
Tetrahydrocannabinol: NOT DETECTED

## 2015-12-05 LAB — COMPREHENSIVE METABOLIC PANEL
ALBUMIN: 4.5 g/dL (ref 3.5–5.0)
ALK PHOS: 53 U/L (ref 38–126)
ALT: 19 U/L (ref 14–54)
AST: 44 U/L — AB (ref 15–41)
Anion gap: 9 (ref 5–15)
BILIRUBIN TOTAL: 0.5 mg/dL (ref 0.3–1.2)
BUN: 22 mg/dL — AB (ref 6–20)
CO2: 25 mmol/L (ref 22–32)
Calcium: 9.1 mg/dL (ref 8.9–10.3)
Chloride: 106 mmol/L (ref 101–111)
Creatinine, Ser: 0.76 mg/dL (ref 0.44–1.00)
GFR calc Af Amer: >60 mL/min (ref >60)
GFR calc non Af Amer: >60 mL/min (ref >60)
GLUCOSE: 136 mg/dL — AB (ref 65–99)
POTASSIUM: 3.4 mmol/L — AB (ref 3.5–5.1)
Sodium: 140 mmol/L (ref 135–145)
TOTAL PROTEIN: 6.9 g/dL (ref 6.5–8.1)

## 2015-12-05 LAB — ETHANOL: Alcohol, Ethyl (B): 61 mg/dL — ABNORMAL HIGH (ref <5)

## 2015-12-05 LAB — I-STAT BETA HCG BLOOD, ED (MC, WL, AP ONLY)

## 2015-12-05 LAB — CARBAMAZEPINE LEVEL, TOTAL: Carbamazepine Lvl: <0.1 ug/mL — ABNORMAL LOW (ref 4.0–12.0)

## 2015-12-05 LAB — SALICYLATE LEVEL

## 2015-12-05 LAB — ACETAMINOPHEN LEVEL

## 2015-12-05 MED ORDER — NALOXONE HCL 2 MG/2ML IJ SOSY
1.2500 mg/h | PREFILLED_SYRINGE | INTRAVENOUS | Status: DC
  Administered 2015-12-05: 1.25 mg/h via INTRAVENOUS
  Filled 2015-12-05: qty 4

## 2015-12-05 NOTE — ED Notes (Signed)
She has been in no distress and is resting comfortably; at times changing her position in bed. She moves all extremities with ease.
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
## 2015-12-05 NOTE — ED Notes (Signed)
Patient found in the lobby unresponsive. Patient was not responding to sternal rub.

## 2015-12-05 NOTE — ED Provider Notes (Signed)
[facility]-EMERGENCY DEPT Provider Note   CSN: [id number] Arrival date & time: 12/05/15  0516     History   Chief Complaint Chief Complaint  Patient presents with  . Drug Overdose    HPI [name] is a 35 y.o. female.  She was brought here by her husband. She had been out with some friends and he went to pick her up. She had stated that she had been mild. He noted that she could barely keep her eyes open and then she became unresponsive so he took her straight to the emergency department. She does have a history of drug use. There has been no overt depression recently and no expression of suicidal thoughts.   The history is provided by the spouse. The history is limited by the condition of the patient (Patient unresponsive).    Past Medical History:  Diagnosis Date  . Anxiety   . Contraceptive education 06/07/2013  . Depression   . Gestational diabetes mellitus, antepartum   . Heroin abuse   . Nausea and vomiting in pregnancy 10/24/2012  . Nicotine addiction 10/24/2012  . Pregnant 10/24/2012  . Preterm labor     Patient Active Problem List   Diagnosis Date Noted  . Opiate withdrawal (HCC) 01/21/2014  . Contraceptive education 06/07/2013  . Gestational diabetes 04/18/2013  . Breech or malpresentation convert to cephalic presentation antepartum 04/07/2013  . Breech presentation 04/04/2013  . Gestational diabetes mellitus, class A2 04/04/2013  . Abnormal maternal glucose tolerance, antepartum 03/21/2013  . History of preterm delivery, currently pregnant 01/16/2013  . Drug use complicating pregnancy 01/16/2013  . Supervision of low-risk pregnancy 12/05/2012  . Nausea and vomiting in pregnancy 10/24/2012  . Nicotine addiction 10/24/2012  . Anxiety 10/24/2012    Past Surgical History:  Procedure Laterality Date  . CHOLECYSTECTOMY    . TONSILLECTOMY    . VAGINAL DELIVERY     X 1    OB History    Gravida Para Term Preterm AB Living   3 2 1 1 1 2    SAB TAB  Ectopic Multiple Live Births   1       2       Home Medications    Prior to Admission medications   Medication Sig Start Date End Date Taking? Authorizing Provider  amoxicillin (AMOXIL) 500 MG capsule Take 1 capsule (500 mg total) by mouth 2 (two) times daily. 03/08/15   Audry Piliyler Mohr, PA-C  cephALEXin (KEFLEX) 500 MG capsule Take 1 capsule (500 mg total) by mouth 2 (two) times daily. 10/28/14   Laurence Spatesachel Morgan Little, MD  fluconazole (DIFLUCAN) 150 MG tablet Take 1 tablet (150 mg total) by mouth once. If you develop symptoms of yeast infection 10/28/14   Laurence Spatesachel Morgan Little, MD  Oxcarbazepine (TRILEPTAL) 300 MG tablet Take 300 mg by mouth 3 (three) times daily. 02/03/15   Historical Provider, MD  pramipexole (MIRAPEX) 0.25 MG tablet Take 0.25 mg by mouth at bedtime. 02/03/15   Historical Provider, MD  SUBOXONE 8-2 MG FILM Take 1 tablet by mouth 3 (three) times daily. 03/06/15   Historical Provider, MD    Family History Family History  Problem Relation Age of Onset  . Hypertension Mother   . Cancer Maternal Grandmother     breast  . Hypertension Maternal Uncle   . Heart attack Maternal Uncle   . Heart attack Father     Social History Social History  Substance Use Topics  . Smoking status: Current Every Day Smoker  Packs/day: 0.50    Types: Cigarettes  . Smokeless tobacco: Never Used  . Alcohol use Yes     Allergies   Patient has no known allergies.   Review of Systems Review of Systems  Unable to perform ROS: Patient unresponsive     Physical Exam Updated Vital Signs BP 133/87 (BP Location: Left Arm)   Pulse (!) 127   Resp (!) 33   Ht 4\' 10"  (1.473 m)   Wt 90 lb (40.8 kg)   LMP  (LMP Unknown)   SpO2 93%   BMI 18.81 kg/m   Physical Exam  Nursing note and vitals reviewed.  35 year old female, Apneic and unresponsive. She is being ventilated by via bag-valve-mask. Vital signs are significant for tachycardia. Oxygen saturation is 93%, which is normal. Head is  normocephalic and atraumatic. PERRLA, EOMI. Pupils are 3 mm. Oropharynx is clear. Neck is nontender and supple without adenopathy or JVD. Back is nontender and there is no CVA tenderness. Lungs are clear without rales, wheezes, or rhonchi. Chest is nontender. Heart has regular rate and rhythm without murmur. Abdomen is soft, flat, nontender without masses or hepatosplenomegaly and peristalsis is normoactive. Extremities have no cyanosis or edema, full range of motion is present. Skin is warm and dry without rash. Neurologic: She is unresponsive even to deep painful stimuli with no spontaneous motor activity.  ED Treatments / Results  Labs (all labs ordered are listed, but only abnormal results are displayed) Labs Reviewed  COMPREHENSIVE METABOLIC PANEL - Abnormal; Notable for the following:       Result Value   Potassium 3.4 (*)    Glucose, Bld 136 (*)    BUN 22 (*)    AST 44 (*)    All other components within normal limits  ETHANOL - Abnormal; Notable for the following:    Alcohol, Ethyl (B) 61 (*)    All other components within normal limits  RAPID URINE DRUG SCREEN, HOSP PERFORMED - Abnormal; Notable for the following:    Opiates POSITIVE (*)    Cocaine POSITIVE (*)    Amphetamines POSITIVE (*)    All other components within normal limits  ACETAMINOPHEN LEVEL - Abnormal; Notable for the following:    Acetaminophen (Tylenol), Serum <10 (*)    All other components within normal limits  CBC WITH DIFFERENTIAL/PLATELET  SALICYLATE LEVEL  CARBAMAZEPINE LEVEL, TOTAL  I-STAT BETA HCG BLOOD, ED ([facility], [facility], [facility] ONLY)    EKG ECG shows normal sinus rhythm with a rate of 94, no ectopy. Normal axis. Normal P wave. Normal QRS. Normal intervals. Normal ST and T waves. Impression: normal ECG. When compared with ECG of 01/21/2014, nonspecific T-wave abnormality is no longer present.  Procedures Procedures (including critical care time) CRITICAL CARE Performed by: ZOXWR,UEAVWGLICK,Kyland No Total critical  care time: 80 minutes Critical care time was exclusive of separately billable procedures and treating other patients. Critical care was necessary to treat or prevent imminent or life-threatening deterioration. Critical care was time spent personally by me on the following activities: development of treatment plan with patient and/or surrogate as well as nursing, discussions with consultants, evaluation of patient's response to treatment, examination of patient, obtaining history from patient or surrogate, ordering and performing treatments and interventions, ordering and review of laboratory studies, ordering and review of radiographic studies, pulse oximetry and re-evaluation of patient's condition.  Medications Ordered in ED Medications  naloxone (NARCAN) 4 mg in dextrose 5 % 250 mL infusion (0 mg/hr Intravenous Paused 12/05/15 0807)  Initial Impression / Assessment and Plan / ED Course  I have reviewed the triage vital signs and the nursing notes.  Pertinent labs & imaging results that were available during my care of the patient were reviewed by me and considered in my medical decision making (see chart for details).  Clinical Course    Patient unresponsive with reports of possible melena, possible drug overdose. IV access was obtained and she was given naloxone and promptly woke up. She states that she had snorted cocaine and drank alcoholic beverages, but denies other drug use. She is placed on an naloxone drip.  Patient has remained alert and with normal oxygen saturations for 3 hours on no oxygen drip. Drip was discontinued. Plan is to discharge her if she continues to stay alert and without evidence of opiate toxicity after one hour off of naloxone drip. Case is signed out to Dr. Freida Busman.  Final Clinical Impressions(s) / ED Diagnoses   Final diagnoses:  Opiate overdose, accidental or unintentional, initial encounter    New Prescriptions New Prescriptions   No medications on  file     Dione Booze, MD 12/05/15 [phone]

## 2015-12-05 NOTE — ED Provider Notes (Signed)
9:42 AM Patient evaluated and awake and alert. Protecting her airway. Stable for discharge   Lorre NickAnthony Navika Hoopes, MD 12/05/15 [phone]

## 2015-12-05 NOTE — ED Notes (Signed)
She is awake and in no distress. She is visiting lucidly with her significant other.

## 2015-12-11 DIAGNOSIS — N764 Abscess of vulva: Secondary | ICD-10-CM

## 2015-12-11 HISTORY — DX: Abscess of vulva: N76.4

## 2015-12-19 ENCOUNTER — Inpatient Hospital Stay (HOSPITAL_COMMUNITY)
Admission: EM | Admit: 2015-12-19 | Discharge: 2015-12-23 | DRG: 758 | Disposition: A | Discharge status: Home / Self Care | Payer: BLUE CROSS/BLUE SHIELD | Attending: Obstetrics & Gynecology | Admitting: Obstetrics & Gynecology

## 2015-12-19 ENCOUNTER — Encounter (HOSPITAL_COMMUNITY): Payer: Self-pay

## 2015-12-19 DIAGNOSIS — N764 Abscess of vulva: Principal | ICD-10-CM | POA: Diagnosis present

## 2015-12-19 DIAGNOSIS — R21 Rash and other nonspecific skin eruption: Secondary | ICD-10-CM | POA: Diagnosis present

## 2015-12-19 DIAGNOSIS — F17210 Nicotine dependence, cigarettes, uncomplicated: Secondary | ICD-10-CM | POA: Diagnosis present

## 2015-12-19 DIAGNOSIS — Z22322 Carrier or suspected carrier of Methicillin resistant Staphylococcus aureus: Secondary | ICD-10-CM

## 2015-12-19 DIAGNOSIS — N760 Acute vaginitis: Secondary | ICD-10-CM | POA: Diagnosis present

## 2015-12-19 DIAGNOSIS — L02413 Cutaneous abscess of right upper limb: Secondary | ICD-10-CM | POA: Diagnosis present

## 2015-12-19 DIAGNOSIS — F1120 Opioid dependence, uncomplicated: Secondary | ICD-10-CM | POA: Diagnosis present

## 2015-12-19 LAB — RAPID HIV SCREEN (HIV 1/2 AB+AG)
HIV 1/2 Antibodies: NONREACTIVE
HIV-1 P24 ANTIGEN - HIV24: NONREACTIVE

## 2015-12-19 LAB — CBC WITH DIFFERENTIAL/PLATELET
BASOS ABS: 0 10*3/uL (ref 0.0–0.1)
BASOS PCT: 0 %
EOS ABS: 0.1 10*3/uL (ref 0.0–0.7)
EOS PCT: 0 %
HCT: 39.5 % (ref 36.0–46.0)
Hemoglobin: 13.7 g/dL (ref 12.0–15.0)
LYMPHS PCT: 5 %
Lymphs Abs: 0.9 10*3/uL (ref 0.7–4.0)
MCH: 31 pg (ref 26.0–34.0)
MCHC: 34.7 g/dL (ref 30.0–36.0)
MCV: 89.4 fL (ref 78.0–100.0)
MONO ABS: 0.7 10*3/uL (ref 0.1–1.0)
Monocytes Relative: 5 %
Neutro Abs: 14.9 10*3/uL — ABNORMAL HIGH (ref 1.7–7.7)
Neutrophils Relative %: 90 %
PLATELETS: 242 10*3/uL (ref 150–400)
RBC: 4.42 MIL/uL (ref 3.87–5.11)
RDW: 11.9 % (ref 11.5–15.5)
WBC: 16.6 10*3/uL — AB (ref 4.0–10.5)

## 2015-12-19 LAB — BASIC METABOLIC PANEL
ANION GAP: 12 (ref 5–15)
BUN: 28 mg/dL — ABNORMAL HIGH (ref 6–20)
CALCIUM: 8.9 mg/dL (ref 8.9–10.3)
CO2: 24 mmol/L (ref 22–32)
Chloride: 100 mmol/L — ABNORMAL LOW (ref 101–111)
Creatinine, Ser: 0.53 mg/dL (ref 0.44–1.00)
GFR calc Af Amer: >60 mL/min (ref >60)
GLUCOSE: 90 mg/dL (ref 65–99)
Potassium: 3.1 mmol/L — ABNORMAL LOW (ref 3.5–5.1)
SODIUM: 136 mmol/L (ref 135–145)

## 2015-12-19 MED ORDER — LIDOCAINE-EPINEPHRINE (PF) 2 %-1:200000 IJ SOLN
10.0000 mL | Freq: Once | INTRAMUSCULAR | Status: DC
  Filled 2015-12-19: qty 20

## 2015-12-19 MED ORDER — DEXTROSE 5 % IV SOLN
1.0000 g | Freq: Once | INTRAVENOUS | Status: AC
  Administered 2015-12-19: 1 g via INTRAVENOUS
  Filled 2015-12-19: qty 10

## 2015-12-19 MED ORDER — ACETAMINOPHEN 325 MG PO TABS
650.0000 mg | ORAL_TABLET | Freq: Once | ORAL | Status: AC
  Administered 2015-12-19: 650 mg via ORAL
  Filled 2015-12-19: qty 2

## 2015-12-19 MED ORDER — AZITHROMYCIN 250 MG PO TABS
1000.0000 mg | ORAL_TABLET | Freq: Once | ORAL | Status: AC
  Administered 2015-12-19: 1000 mg via ORAL
  Filled 2015-12-19: qty 4

## 2015-12-19 NOTE — ED Provider Notes (Signed)
INCISION AND DRAINAGE Performed by: Alexis HorsemanBROWNING, Alexis Avery Consent: Verbal consent obtained. Risks and benefits: risks, benefits and alternatives were discussed Type: abscess  Body area: left vulvar  Anesthesia: local infiltration  Incision was made with a scalpel.  Local anesthetic: lidocaine 2% with epinephrine  Anesthetic total: 5 ml  Complexity: complex Blunt dissection to break up loculations  Drainage: none  Drainage amount: n/a  Packing material: none Patient tolerance: Patient tolerated the procedure well with no immediate complications.   Patient has induration of the vulva with an overlying sore, I am unable to palpate an inferior cyst.  No bartholin cyst.  No pus was aspirated with careful exploration of the superior labia.  No clear fluid collection seen on US.  I suspect that the patient has soft tissue swelling and cellulitis from the sore on her superior labia.  Patient has similar sores covering most of her body and several other locations concerning for mild cellulitis.           Alexis HorsemanRobert Ramir Malerba, PA-C 12/19/15 1922    Nelva Nayobert Beaton, MD 12/20/15 1224

## 2015-12-19 NOTE — ED Notes (Signed)
PT NOW STS BILATERAL LABIAL ABSCESSES, AS WELL.

## 2015-12-19 NOTE — ED Triage Notes (Signed)
PT C/O A RED ITCHY RASH ALL OVER AND AN ABSCESS TO THE BACK OF THE NECK AND THE BACK OF THE RIGHT ARM X2 DAYS. PT STS PUS CAME OUT OF THE BOTH TODAY. DENIES FEVER AND UNKNOWN SOURCE.

## 2015-12-19 NOTE — ED Provider Notes (Signed)
[facility]-EMERGENCY DEPT Provider Note   CSN: [id number] Arrival date & time: 12/19/15  1528     History   Chief Complaint Chief Complaint  Patient presents with  . Abscess  . Rash    HPI Alexis Avery is a 35 y.o. female with history of heroin use and other drug use presents to the ED with a left labial swelling 3 days and severe pain since this morning. She also complains of abscess behind neck 3 days. He characterizes her labial swelling pain as sharp, nonradiating, 10/10 severity. She also reports sharp, nonradiating, 10/10 severity the abscess behind neck as well as small lesions on trunk and extremities. She states that this has never happened to her before. Does not know why this may have happened. Patient denies IVDU or history of it. Patient denies chest pain, shortness of breath, fever, chills, urinary symptoms, changes in bowel movements, vaginal pain, vaginal discharge, vaginal bleeding.  HPI  Past Medical History:  Diagnosis Date  . Anxiety   . Contraceptive education 06/07/2013  . Depression   . Gestational diabetes mellitus, antepartum   . Heroin abuse   . Nausea and vomiting in pregnancy 10/24/2012  . Nicotine addiction 10/24/2012  . Pregnant 10/24/2012  . Preterm labor     Patient Active Problem List   Diagnosis Date Noted  . Labial infection 12/19/2015  . Opiate withdrawal (HCC) 01/21/2014  . Contraceptive education 06/07/2013  . Gestational diabetes 04/18/2013  . Breech or malpresentation convert to cephalic presentation antepartum 04/07/2013  . Breech presentation 04/04/2013  . Gestational diabetes mellitus, class A2 04/04/2013  . Abnormal maternal glucose tolerance, antepartum 03/21/2013  . History of preterm delivery, currently pregnant 01/16/2013  . Drug use complicating pregnancy 01/16/2013  . Supervision of low-risk pregnancy 12/05/2012  . Nausea and vomiting in pregnancy 10/24/2012  . Nicotine addiction 10/24/2012  . Anxiety 10/24/2012     Past Surgical History:  Procedure Laterality Date  . CHOLECYSTECTOMY    . TONSILLECTOMY    . VAGINAL DELIVERY     X 1    OB History    Gravida Para Term Preterm AB Living   3 2 1 1 1 2    SAB TAB Ectopic Multiple Live Births   1       2       Home Medications    Prior to Admission medications   Medication Sig Start Date End Date Taking? Authorizing Provider  aspirin-acetaminophen-caffeine (EXCEDRIN MIGRAINE) [id number] MG tablet Take 2 tablets by mouth every 6 (six) hours as needed for headache (or general pain).   Yes Historical Provider, MD  SUBOXONE 8-2 MG FILM Place 1 Film under the tongue 3 (three) times daily. Dissolve on tongue three times daily 03/06/15  Yes Historical Provider, MD    Family History Family History  Problem Relation Age of Onset  . Hypertension Mother   . Cancer Maternal Grandmother     breast  . Hypertension Maternal Uncle   . Heart attack Maternal Uncle   . Heart attack Father     Social History Social History  Substance Use Topics  . Smoking status: Current Every Day Smoker    Packs/day: 0.50    Types: Cigarettes  . Smokeless tobacco: Never Used  . Alcohol use Yes     Allergies   Patient has no known allergies.   Review of Systems Review of Systems  Constitutional: Negative for chills and fever.  Respiratory: Negative for cough and shortness of breath.   Cardiovascular: Negative  for chest pain.  Gastrointestinal: Negative for abdominal distention, abdominal pain, constipation, diarrhea, nausea and vomiting.  Genitourinary: Negative for difficulty urinating, dysuria, frequency, vaginal bleeding, vaginal discharge and vaginal pain.  Musculoskeletal: Negative for back pain and myalgias.  Skin:       Left labial swelling Abscess on back of neck Skin lesions      Physical Exam Updated Vital Signs BP 122/87 (BP Location: Left Arm)   Pulse 112   Temp 102.8 F (39.3 C) (Oral)   Resp 18   Ht 4\' 9"  (1.448 m)   Wt 39.4 kg    LMP 12/06/2015   SpO2 98%   BMI 18.78 kg/m   Physical Exam  Constitutional: She is oriented to person, place, and time. She appears well-developed and well-nourished.  HENT:  Head: Normocephalic and atraumatic.  Nose: Nose normal.  Mouth/Throat: Oropharynx is clear and moist.  Eyes: Conjunctivae and EOM are normal. Pupils are equal, round, and reactive to light.  Neck: Normal range of motion. Neck supple.  Cardiovascular: Normal rate and normal heart sounds.   Pulmonary/Chest: Effort normal and breath sounds normal. No respiratory distress.  Abdominal: Soft.  Musculoskeletal: Normal range of motion.  Neurological: She is alert and oriented to person, place, and time.  Skin: Skin is warm.  Left labial swelling, with no surrounding erythema. TTP.  Abscess on back of neck Skin lesions on trunk, back, extremities.  Psychiatric: She has a normal mood and affect. Her behavior is normal.  Nursing note and vitals reviewed.    ED Treatments / Results  Labs (all labs ordered are listed, but only abnormal results are displayed) Labs Reviewed  CBC WITH DIFFERENTIAL/PLATELET - Abnormal; Notable for the following:       Result Value   WBC 16.6 (*)    Neutro Abs 14.9 (*)    All other components within normal limits  BASIC METABOLIC PANEL - Abnormal; Notable for the following:    Potassium 3.1 (*)    Chloride 100 (*)    BUN 28 (*)    All other components within normal limits  RAPID HIV SCREEN (HIV 1/2 AB+AG)  RPR    EKG  EKG Interpretation None       Radiology No results found.  Procedures Procedures (including critical care time)  Medications Ordered in ED Medications  lidocaine-EPINEPHrine (XYLOCAINE W/EPI) 2 %-1:200000 (PF) injection 10 mL (not administered)  azithromycin (ZITHROMAX) tablet 1,000 mg (1,000 mg Oral Given 12/19/15 1932)  cefTRIAXone (ROCEPHIN) 1 g in dextrose 5 % 50 mL IVPB (0 g Intravenous Stopped 12/19/15 2009)  acetaminophen (TYLENOL) tablet 650 mg  (650 mg Oral Given 12/19/15 2245)     Initial Impression / Assessment and Plan / ED Course  I have reviewed the triage vital signs and the nursing notes.  Pertinent labs & imaging results that were available during my care of the patient were reviewed by me and considered in my medical decision making (see chart for details).  Clinical Course   Dr. Radford Pax also saw patient. Patient is a 35 year old female presenting with labial swelling and rash for 3 days. She states her labial swelling causing severe pain and since Alexis this morning. On exam patient uncomfortable, afebrile, BP elevated. Heart and lung sounds clear. Abdomen soft and nontender. Patient has small lesions on trunk, back and extremities. Left labial swelling noted. Unsuccessful drainage for possible abscess or Bartholin cyst.  Patient's white blood cell count elevated, and developed fever of 102.8, treated with Tylenol.  Dr.  Beaty [facility] patient for their care. Not able to obtain urine sample before transportation since she had just urinated before the order placed. Patient understood that she was going to be transferred. Patient agreed with assessment and plan.   Final Clinical Impressions(s) / ED Diagnoses   Final diagnoses:  Labial infection  Rash    New Prescriptions New Prescriptions   No medications on file     [address]Franc[address] Manuel MilfordEspina, [place] 12/19/15 2318    Nelva Nayobert Beaton, MD 12/20/15 [phone]

## 2015-12-20 ENCOUNTER — Encounter (HOSPITAL_COMMUNITY): Payer: Self-pay | Admitting: *Deleted

## 2015-12-20 LAB — CBC
HEMATOCRIT: 37.6 % (ref 36.0–46.0)
HEMOGLOBIN: 13.2 g/dL (ref 12.0–15.0)
MCH: 30.9 pg (ref 26.0–34.0)
MCHC: 35.1 g/dL (ref 30.0–36.0)
MCV: 88.1 fL (ref 78.0–100.0)
Platelets: 267 10*3/uL (ref 150–400)
RBC: 4.27 MIL/uL (ref 3.87–5.11)
RDW: 12.1 % (ref 11.5–15.5)
WBC: 20.2 10*3/uL — ABNORMAL HIGH (ref 4.0–10.5)

## 2015-12-20 LAB — RPR: RPR Ser Ql: NONREACTIVE

## 2015-12-20 MED ORDER — INFLUENZA VAC SPLIT QUAD 0.5 ML IM SUSY: 0.5000 mL | PREFILLED_SYRINGE | INTRAMUSCULAR | Status: DC

## 2015-12-20 MED ORDER — BUPRENORPHINE HCL 8 MG SL SUBL
8.0000 mg | SUBLINGUAL_TABLET | Freq: Every day | SUBLINGUAL | Status: DC
  Administered 2015-12-20 – 2015-12-23 (×4): 8 mg via SUBLINGUAL
  Filled 2015-12-20 (×4): qty 1

## 2015-12-20 MED ORDER — VANCOMYCIN HCL IN DEXTROSE 750-5 MG/150ML-% IV SOLN
750.0000 mg | Freq: Two times a day (BID) | INTRAVENOUS | Status: DC
  Filled 2015-12-20: qty 150

## 2015-12-20 MED ORDER — VANCOMYCIN HCL 1000 MG IV SOLR
750.0000 mg | Freq: Two times a day (BID) | INTRAVENOUS | Status: DC
  Administered 2015-12-21 – 2015-12-23 (×5): 750 mg via INTRAVENOUS
  Filled 2015-12-20 (×6): qty 750

## 2015-12-20 MED ORDER — VANCOMYCIN HCL IN DEXTROSE 750-5 MG/150ML-% IV SOLN: 750.0000 mg | Freq: Two times a day (BID) | INTRAVENOUS | Status: DC

## 2015-12-20 MED ORDER — NICOTINE 14 MG/24HR TD PT24
14.0000 mg | MEDICATED_PATCH | Freq: Every day | TRANSDERMAL | Status: DC
  Administered 2015-12-20 – 2015-12-22 (×3): 14 mg via TRANSDERMAL
  Filled 2015-12-20 (×5): qty 1

## 2015-12-20 MED ORDER — VANCOMYCIN HCL 500 MG IV SOLR
500.0000 mg | Freq: Two times a day (BID) | INTRAVENOUS | Status: DC
  Administered 2015-12-20 (×2): 500 mg via INTRAVENOUS
  Filled 2015-12-20 (×3): qty 500

## 2015-12-20 MED ORDER — OXYCODONE-ACETAMINOPHEN 5-325 MG PO TABS
2.0000 | ORAL_TABLET | ORAL | Status: DC | PRN
  Administered 2015-12-20 – 2015-12-23 (×18): 2 via ORAL
  Filled 2015-12-20 (×18): qty 2

## 2015-12-20 MED ORDER — VANCOMYCIN HCL IN DEXTROSE 1-5 GM/200ML-% IV SOLN: 1000.0000 mg | Freq: Two times a day (BID) | INTRAVENOUS | Status: DC

## 2015-12-20 MED ORDER — ONDANSETRON HCL 4 MG/2ML IJ SOLN: 4.0000 mg | Freq: Three times a day (TID) | INTRAMUSCULAR | Status: AC | PRN

## 2015-12-20 NOTE — Progress Notes (Signed)
Subjective: Patient reports painful vaginal area and rash on her body x 3 days.  She reports the rash started 3 days ago as small red bumps that were itching and she scratched and picked at them causing them to become scabs.  She has these scabs on her face, neck, chest, arms, hands, and legs and feet.  They currently do not itch or cause her pain except one large sore on the back of her head/neck and on her right upper arm.  Pt does have hx of methamphetamine use. She reports she had varicella zoster outbreak as a child.  She denies known contact with skin infection/rash.  She denies vaginal bleeding, vaginal itching/burning, urinary symptoms, h/a, dizziness, n/v, or fever/chills.    Objective: I have reviewed patient's vital signs, intake and output, medications and labs.  VS reviewed, nursing note reviewed,  Constitutional: well developed, well nourished, no distress HEENT: normocephalic CV: normal rate Pulm/chest wall: normal effort Abdomen: soft Neuro: alert and oriented x 3 Skin: warm, dry, with multiple flat red blistered lesions on face, neck, chest, arms, hands, legs, and feet, that are not sensitive to palpation and not weeping.  Most are 0.25 to 0.5 cm in size with one large lesion on the back of her head/neck that is 2 cm x 2 cm in diameter, errythemetous, and painful to touch  Psych: affect normal     Assessment/Plan: Skin rash, ? Varicella, ? Methamphetamine related Vulvar abscess   HSV serum, HSV swab of vulva Varicella swab of several lesions of arms, legs, chest IV abx for abscess  Percocet PO for pain   LOS: 1 day    LEFTWICH-KIRBY, Lanyia Jewel 12/20/2015, 4:19 AM

## 2015-12-20 NOTE — Progress Notes (Signed)
Patient has a red raised rash from head to lower legs some with scabs none are draining.Patient states it first appeared on Friday and was itching but had the Chickenpox in her late teens early 20's.Patients left labia is very swollen and red but no drainage noted.A red hard place on her right upper arm looks like one of these lesions got infected from her scratching and is draining a very small amount of purulent looking blood but no odor noted.One more red,swollen, healed over lesion is on her neck just above her hair line.

## 2015-12-20 NOTE — H&P (Signed)
Preoperative History and Physical  Alexis Avery is a 35 y.o. [id number] with Patient's last menstrual period was 12/06/2015. admitted for a left labial abscess  The painful lesion on her vulva started 3 days ago and has gotten progressively worse,  As far as she knows it has not drained although she has picked at it and tried to get it to drain  She also has a tender infected evolving abscess on her occipital scalp also she has picked at it and actually did get it to drain 2 days ago  She obtains 8 mg  suboxone strips daily off the street and has for some time.  She started at a clinics but fails her drug test because she uses heroin every week or so sometimes 1 or 2 times a month .  Patient reports painful vaginal area and rash on her body x 3 days.  She reports the rash started 3 days ago as small red bumps that were itching and she scratched and picked at them causing them to become scabs.  She has these scabs on her face, neck, chest, arms, hands, and legs and feet.  They currently do not itch or cause her pain except one large sore on the back of her head/neck and on her right upper arm.  Pt does have hx of methamphetamine use. She reports she had varicella zoster outbreak as a child.  She denies known contact with skin infection/rash.  She denies vaginal bleeding, vaginal itching/burning, urinary symptoms, h/a, dizziness, n/v, or fever/chills.    PMH:    Past Medical History:  Diagnosis Date  . Anxiety   . Contraceptive education 06/07/2013  . Depression   . Gestational diabetes mellitus, antepartum   . Heroin abuse   . Nausea and vomiting in pregnancy 10/24/2012  . Nicotine addiction 10/24/2012  . Pregnant 10/24/2012  . Preterm labor     PSH:     Past Surgical History:  Procedure Laterality Date  . CHOLECYSTECTOMY    . TONSILLECTOMY    . VAGINAL DELIVERY     X 1    POb/GynH:      OB History    Gravida Para Term Preterm AB Living   3 2 1 1 1 2    SAB TAB Ectopic Multiple  Live Births   1       2      SH:   Social History  Substance Use Topics  . Smoking status: Current Every Day Smoker    Packs/day: 0.50    Types: Cigarettes  . Smokeless tobacco: Never Used  . Alcohol use Yes    FH:    Family History  Problem Relation Age of Onset  . Hypertension Mother   . Cancer Maternal Grandmother     breast  . Hypertension Maternal Uncle   . Heart attack Maternal Uncle   . Heart attack Father      Allergies: No Known Allergies  Medications:       Current Facility-Administered Medications:  .  buprenorphine (SUBUTEX) sublingual tablet 8 mg, 8 mg, Sublingual, Daily, Lazaro Arms, MD .  Melene Muller ON 12/21/2015] Influenza vac split quadrivalent PF (FLUARIX) injection 0.5 mL, 0.5 mL, Intramuscular, Tomorrow-1000, Willodean Rosenthal, MD .  nicotine (NICODERM CQ - dosed in mg/24 hours) patch 14 mg, 14 mg, Transdermal, Daily, Lazaro Arms, MD .  oxyCODONE-acetaminophen (PERCOCET/ROXICET) 5-325 MG per tablet 2 tablet, 2 tablet, Oral, Q4H PRN, Hurshel Party, CNM, 2 tablet at 12/20/15 1719 .  vancomycin (  VANCOCIN) 500 mg in sodium chloride 0.9 % 100 mL IVPB, 500 mg, Intravenous, Q12H, Willodean Rosenthal, MD, 500 mg at 12/20/15 1442  Review of Systems:   Review of Systems  Constitutional: Negative for fever, chills, weight loss, malaise/fatigue and diaphoresis.  HENT: Negative for hearing loss, ear pain, nosebleeds, congestion, sore throat, neck pain, tinnitus and ear discharge.   Eyes: Negative for blurred vision, double vision, photophobia, pain, discharge and redness.  Respiratory: Negative for cough, hemoptysis, sputum production, shortness of breath, wheezing and stridor.   Cardiovascular: Negative for chest pain, palpitations, orthopnea, claudication, leg swelling and PND.  Gastrointestinal: Positive for abdominal pain. Negative for heartburn, nausea, vomiting, diarrhea, constipation, blood in stool and melena.  Genitourinary: Negative for  dysuria, urgency, frequency, hematuria and flank pain.  Musculoskeletal: Negative for myalgias, back pain, joint pain and falls.  Skin: Negative for itching and rash.  Neurological: Negative for dizziness, tingling, tremors, sensory change, speech change, focal weakness, seizures, loss of consciousness, weakness and headaches.  Endo/Heme/Allergies: Negative for environmental allergies and polydipsia. Does not bruise/bleed easily.  Psychiatric/Behavioral: Negative for depression, suicidal ideas, hallucinations, memory loss and substance abuse. The patient is not nervous/anxious and does not have insomnia.      PHYSICAL EXAM:  Blood pressure 108/65, pulse 86, temperature 98.9 F (37.2 C), temperature source Oral, resp. rate 18, height 4\' 9"  (1.448 m), weight 85 lb 4 oz (38.7 kg), last menstrual period 12/06/2015, SpO2 99 %.    Vitals reviewed. Constitutional: She is oriented to person, place, and time. She appears well-developed and well-nourished.  HENT: tender erythematous indurated area 2 x 2 cm left posterior scalp Head: Normocephalic and atraumatic.  Right Ear: External ear normal.  Left Ear: External ear normal.  Nose: Nose normal.  Mouth/Throat: Oropharynx is clear and moist.  Eyes: Conjunctivae and EOM are normal. Pupils are equal, round, and reactive to light. Right eye exhibits no discharge. Left eye exhibits no discharge. No scleral icterus.  Neck: Normal range of motion. Neck supple. No tracheal deviation present. No thyromegaly present.  Cardiovascular: Normal rate, regular rhythm, normal heart sounds and intact distal pulses.  Exam reveals no gallop and no friction rub.   No murmur heard. Respiratory: Effort normal and breath sounds normal. No respiratory distress. She has no wheezes. She has no rales. She exhibits no tenderness.  GI: Soft. Bowel sounds are normal. She exhibits no distension and no mass. There is tenderness. There is no rebound and no guarding.   Genitourinary:       Vulva left vulvar abscess with mostly induration, at this point probably not much to drain Vagina is pink moist without discharge Cervix not done Uterus is not examined Adnexa is not examined  Musculoskeletal: Normal range of motion. She exhibits no edema and no tenderness.  Neurological: She is alert and oriented to person, place, and time. She has normal reflexes. She displays normal reflexes. No cranial nerve deficit. She exhibits normal muscle tone. Coordination normal.  Skin: Skin is warm and dry. Scattered scabs due to picking, no vesicles or maculopapular rash  Psychiatric: She has a normal mood and affect. Her behavior is normal. Judgment and thought content normal.    Labs: Results for orders placed or performed during the hospital encounter of 12/19/15 (from the past 336 hour(s))  RPR   Collection Time: 12/19/15  7:21 PM  Result Value Ref Range   RPR Ser Ql Non Reactive Non Reactive  CBC with Differential   Collection Time: 12/19/15  7:21  PM  Result Value Ref Range   WBC 16.6 (H) 4.0 - 10.5 K/uL   RBC 4.42 3.87 - 5.11 MIL/uL   Hemoglobin 13.7 12.0 - 15.0 g/dL   HCT 16.139.5 09.636.0 - 04.546.0 %   MCV 89.4 78.0 - 100.0 fL   MCH 31.0 26.0 - 34.0 pg   MCHC 34.7 30.0 - 36.0 g/dL   RDW 40.911.9 81.111.5 - 91.415.5 %   Platelets 242 150 - 400 K/uL   Neutrophils Relative % 90 %   Neutro Abs 14.9 (H) 1.7 - 7.7 K/uL   Lymphocytes Relative 5 %   Lymphs Abs 0.9 0.7 - 4.0 K/uL   Monocytes Relative 5 %   Monocytes Absolute 0.7 0.1 - 1.0 K/uL   Eosinophils Relative 0 %   Eosinophils Absolute 0.1 0.0 - 0.7 K/uL   Basophils Relative 0 %   Basophils Absolute 0.0 0.0 - 0.1 K/uL  Basic metabolic panel   Collection Time: 12/19/15  7:21 PM  Result Value Ref Range   Sodium 136 135 - 145 mmol/L   Potassium 3.1 (L) 3.5 - 5.1 mmol/L   Chloride 100 (L) 101 - 111 mmol/L   CO2 24 22 - 32 mmol/L   Glucose, Bld 90 65 - 99 mg/dL   BUN 28 (H) 6 - 20 mg/dL   Creatinine, Ser 7.820.53 0.44 - 1.00  mg/dL   Calcium 8.9 8.9 - 95.610.3 mg/dL   GFR calc non Af Amer >60 >60 mL/min   GFR calc Af Amer >60 >60 mL/min   Anion gap 12 5 - 15  Rapid HIV screen (HIV 1/2 Ab+Ag)   Collection Time: 12/19/15  7:21 PM  Result Value Ref Range   HIV-1 P24 Antigen - HIV24 NON REACTIVE NON REACTIVE   HIV 1/2 Antibodies NON REACTIVE NON REACTIVE   Interpretation (HIV Ag Ab)      A non reactive test result means that HIV 1 or HIV 2 antibodies and HIV 1 p24 antigen were not detected in the specimen.  CBC   Collection Time: 12/20/15  3:36 AM  Result Value Ref Range   WBC 20.2 (H) 4.0 - 10.5 K/uL   RBC 4.27 3.87 - 5.11 MIL/uL   Hemoglobin 13.2 12.0 - 15.0 g/dL   HCT 21.337.6 08.636.0 - 57.846.0 %   MCV 88.1 78.0 - 100.0 fL   MCH 30.9 26.0 - 34.0 pg   MCHC 35.1 30.0 - 36.0 g/dL   RDW 46.912.1 62.911.5 - 52.815.5 %   Platelets 267 150 - 400 K/uL    EKG: Orders placed or performed during the hospital encounter of 12/05/15  . ED EKG  . ED EKG  . EKG    Imaging Studies: No results found.    Assessment: Left labial abscess Left occipital scalp infection/evolving abscess Chronic Skin lesions secondary to picking, not consistent with varicella Chronic drug abuse, narcotics and methamphetamine      Patient Active Problem List   Diagnosis Date Noted  . Labial infection 12/19/2015  . Opiate withdrawal (HCC) 01/21/2014  . Contraceptive education 06/07/2013  . Gestational diabetes 04/18/2013  . Breech or malpresentation convert to cephalic presentation antepartum 04/07/2013  . Breech presentation 04/04/2013  . Gestational diabetes mellitus, class A2 04/04/2013  . Abnormal maternal glucose tolerance, antepartum 03/21/2013  . History of preterm delivery, currently pregnant 01/16/2013  . Drug use complicating pregnancy 01/16/2013  . Supervision of low-risk pregnancy 12/05/2012  . Nausea and vomiting in pregnancy 10/24/2012  . Nicotine addiction 10/24/2012  . Anxiety  10/24/2012    Plan: Begin subutex(hospital does  not have suboxone) 8 mg Continue vancomycin  Daily evaluation for possible surgical management  Maliah Pyles H 12/20/2015 5:33 PM

## 2015-12-20 NOTE — Progress Notes (Signed)
Pharmacy Antibiotic Note  Alexis Avery is a 35 y.o. female admitted on 12/19/2015 with labial abscess.  Pharmacy has been consulted for Vancomycin dosing. [facility] which [facility].   Plan: Vancomycin 500mg  IV q12 Will plan to draw Vanc trough ~ 4th dose  Height: 4\' 9"  (144.8 cm) Weight: 85 lb 4 oz (38.7 kg) IBW/kg (Calculated) : 38.6  Temp (24hrs), Avg:100.4 F (38 C), Min:98.2 F (36.8 C), Max:102.8 F (39.3 C)   Recent Labs Lab 12/19/15 1921  WBC 16.6*  CREATININE 0.53    Estimated Creatinine Clearance: 60.9 mL/min (by C-G formula based on SCr of 0.53 mg/dL).    No Known Allergies  Antimicrobials this admission: Rocephin 1 gram [facility] Azithromycin 500mg  [facility]  Dose adjustments this admission:   Microbiology results:  BCx:  UCx: Sputum:  MRSA PCR:   Thank you for allowing pharmacy to be a part of this patient's care.  Claybon Jabsngel, Albertia Carvin G 12/20/2015 1:23 AM

## 2015-12-21 ENCOUNTER — Encounter (HOSPITAL_COMMUNITY): Payer: Self-pay

## 2015-12-21 LAB — HSV(HERPES SMPLX)ABS-I+II(IGG+IGM)-BLD
HSV 1 Glycoprotein G Ab, IgG: 33.1 index — ABNORMAL HIGH (ref 0.00–0.90)
HSV 2 Glycoprotein G Ab, IgG: <0.91 index (ref 0.00–0.90)
HSVI/II Comb IgM: 1.31 Ratio — ABNORMAL HIGH (ref 0.00–0.90)

## 2015-12-21 LAB — HERPES SIMPLEX VIRUS(HSV) DNA BY PCR
HSV 1 DNA: NEGATIVE
HSV 2 DNA: NEGATIVE

## 2015-12-21 LAB — VARICELLA-ZOSTER BY PCR: VARICELLA-ZOSTER, PCR: NEGATIVE

## 2015-12-21 LAB — GC/CHLAMYDIA PROBE AMP (~~LOC~~) NOT AT ARMC
CHLAMYDIA, DNA PROBE: NEGATIVE
Neisseria Gonorrhea: NEGATIVE

## 2015-12-21 NOTE — Progress Notes (Signed)
Left vulvar abscess, Left occipitqal scalp abscess, right forearm abscess  Subjective: Patient reports feeling better All the sites feel better, the scalp lesion still hurts the most The right forearm drained bloody purulence.    Objective: I have reviewed patient's vital signs, intake and output, medications, labs and microbiology.  Left occipital scalp is coalescing red tender not draining the pick site is scabbing Left vulva is also coalescing mostly tissue induration but there some fluctuance inferiorly Right forearm is wrapped  The skin pick sites are all scabbing equally, no vesicles, this is not consistent with varicella  Results for orders placed or performed during the hospital encounter of 12/19/15 (from the past 48 hour(s))  RPR   Collection Time: 12/19/15  7:21 PM  Result Value Ref Range   RPR Ser Ql Non Reactive Non Reactive  CBC with Differential   Collection Time: 12/19/15  7:21 PM  Result Value Ref Range   WBC 16.6 (Avery) 4.0 - 10.5 K/uL   RBC 4.42 3.87 - 5.11 MIL/uL   Hemoglobin 13.7 12.0 - 15.0 g/dL   HCT 16.139.5 09.636.0 - 04.546.0 %   MCV 89.4 78.0 - 100.0 fL   MCH 31.0 26.0 - 34.0 pg   MCHC 34.7 30.0 - 36.0 g/dL   RDW 40.911.9 81.111.5 - 91.415.5 %   Platelets 242 150 - 400 K/uL   Neutrophils Relative % 90 %   Neutro Abs 14.9 (Avery) 1.7 - 7.7 K/uL   Lymphocytes Relative 5 %   Lymphs Abs 0.9 0.7 - 4.0 K/uL   Monocytes Relative 5 %   Monocytes Absolute 0.7 0.1 - 1.0 K/uL   Eosinophils Relative 0 %   Eosinophils Absolute 0.1 0.0 - 0.7 K/uL   Basophils Relative 0 %   Basophils Absolute 0.0 0.0 - 0.1 K/uL  Basic metabolic panel   Collection Time: 12/19/15  7:21 PM  Result Value Ref Range   Sodium 136 135 - 145 mmol/L   Potassium 3.1 (L) 3.5 - 5.1 mmol/L   Chloride 100 (L) 101 - 111 mmol/L   CO2 24 22 - 32 mmol/L   Glucose, Bld 90 65 - 99 mg/dL   BUN 28 (Avery) 6 - 20 mg/dL   Creatinine, Ser 7.820.53 0.44 - 1.00 mg/dL   Calcium 8.9 8.9 - 95.610.3 mg/dL   GFR calc non Af Amer >60 >60  mL/min   GFR calc Af Amer >60 >60 mL/min   Anion gap 12 5 - 15  Rapid HIV screen (HIV 1/2 Ab+Ag)   Collection Time: 12/19/15  7:21 PM  Result Value Ref Range   HIV-1 P24 Antigen - HIV24 NON REACTIVE NON REACTIVE   HIV 1/2 Antibodies NON REACTIVE NON REACTIVE   Interpretation (HIV Ag Ab)      A non reactive test result means that HIV 1 or HIV 2 antibodies and HIV 1 p24 antigen were not detected in the specimen.  CBC   Collection Time: 12/20/15  3:36 AM  Result Value Ref Range   WBC 20.2 (Avery) 4.0 - 10.5 K/uL   RBC 4.27 3.87 - 5.11 MIL/uL   Hemoglobin 13.2 12.0 - 15.0 g/dL   HCT 21.337.6 08.636.0 - 57.846.0 %   MCV 88.1 78.0 - 100.0 fL   MCH 30.9 26.0 - 34.0 pg   MCHC 35.1 30.0 - 36.0 g/dL   RDW 46.912.1 62.911.5 - 52.815.5 %   Platelets 267 150 - 400 K/uL     Assessment/Plan: Left vulvar abscess Left occipital scalp phlegmon Right forearm  abscess  Drug abuse  Continue vancomycin 750 mg IV BID Nicotine patch subutex 8 mg daily    LOS: 2 days    EURE,Alexis Avery 12/21/2015, 6:38 AM

## 2015-12-21 NOTE — Clinical Social Work Note (Signed)
Clinical Social Work Assessment  Patient Details  Name: Alexis Avery MRN: [id number] Date of Birth: [birth year]  Date of referral:  12/21/15               Reason for consult:  Substance Use/ETOH Abuse                Permission sought to share information with:  Case Manager Permission granted to share information::  No  Name::        Agency::     Relationship::  none at this time  Contact Information:     Housing/Transportation Living arrangements for the past 2 months:  Single Family Home Source of Information:  Patient, Medical Team Patient Interpreter Needed:  None Criminal Activity/Legal Involvement Pertinent to Current Situation/Hospitalization:  No - Comment as needed Significant Relationships:  Parents, Spouse Lives with:  Minor Children, Spouse Do you feel safe going back to the place where you live?  Yes Need for family participation in patient care:  No (Coment)  Care giving concerns:  No care giving concerns per patient.  Reports she lives with her husband and 2 minor children  33 years old and 37 years old. Reports she depends on her husband who care for family as she does not work.   Social Worker assessment / plan:  LCSW met with patient at the bedside after receiving consult for SA with positive UDS.  Explained role and reason for consult for substance abuse.  Patient very open in discussion, but would not elaborate on details regarding hx other than she has been using since age 44, was able to remain sober while pregnant with both children and continues to use.  Reports last use was a week ago, but UDS was positive for cocaine, opiates, and Amp.  Most recent ED visit was on 11/25 where she was unresponsive with possible heroine overdose.    Patient reports she lives with her husband and 2 minor children:  44 years old and 17 years old.  She was seen in observation unit at [facility] in January of 2016 for substance abuse and has received treatment in the past with inpatient and  outpatient, most recent:  [facility].  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  Denies any needles or IV use, but reports snorting. Denies any CPS involvement with minor children and CPS of [place] confirms no involvement.  Patient is not open to inpatient treatment at this time, but open to outpatient in which LCSW will add referrals of AVS for patient to follow up. Reports husband and children will be up to see patient today.  Patient reports she does not work and depends solely on husband whom she recently married a few years ago, but has been involved with for over 39 years.    Plan:  LCSW staffed case with co-worker with regards to making CPS report as patient lives in home with minor children while using illicit drugs and 2 admission to hospital with drug related admission. At this time due to supervision of children and chronic SA use, CPS report to be made by this Probation officer for safety of children.  Patient reports her husband has been the taking care of children and she does not use in home, however this cannot be validated.    Will also give outpatient resources as discussed  with patient.  NO barriers to DC.  CPS can follow up with patient and children in the community. LCSW has not informed patient of CPS report at this time.    Employment status:  Unemployed Forensic scientist:  [employer] PT Recommendations:  Not assessed at this time Information / Referral to community resources:  Outpatient Substance Abuse Treatment Options, Residential Substance Abuse Treatment Options  Patient/Family's Response to care:  Agreeable to resources  Patient/Family's Understanding of and Emotional Response to Diagnosis, Current Treatment, and Prognosis:  Patient shows awareness of chronic use of substances, but lacks understanding of  consequences regarding children and health.  She continues to report wanting help, but relapse has been consistent in the last year.    Emotional Assessment Appearance:  Appears older than stated age Attitude/Demeanor/Rapport:  Apprehensive, Guarded Affect (typically observed):  Hopeful, Tearful/Crying Orientation:  Oriented to Self, Oriented to Place, Oriented to  Time, Oriented to Situation Alcohol / Substance use:  Illicit Drugs (long hx of heroine, opiates, amph. and cocaine) Psych involvement (Current and /or in the community):  No (Comment)  Discharge Needs  Concerns to be addressed:  Childcare Concerns, Adjustment to Illness, Substance Abuse Concerns Readmission within the last 30 days:  Yes Current discharge risk:  Substance Abuse Barriers to Discharge:  No Barriers Identified, Continued Medical Work up   Lilly [place], LCSW 12/21/2015, 2:26 PM

## 2015-12-22 DIAGNOSIS — R21 Rash and other nonspecific skin eruption: Secondary | ICD-10-CM

## 2015-12-22 DIAGNOSIS — N764 Abscess of vulva: Principal | ICD-10-CM

## 2015-12-22 LAB — CBC WITH DIFFERENTIAL/PLATELET
BASOS ABS: 0 10*3/uL (ref 0.0–0.1)
BASOS PCT: 0 %
EOS ABS: 0.3 10*3/uL (ref 0.0–0.7)
Eosinophils Relative: 2 %
HCT: 35.1 % — ABNORMAL LOW (ref 36.0–46.0)
HEMOGLOBIN: 11.9 g/dL — AB (ref 12.0–15.0)
Lymphocytes Relative: 21 %
Lymphs Abs: 2.4 10*3/uL (ref 0.7–4.0)
MCH: 30.5 pg (ref 26.0–34.0)
MCHC: 33.9 g/dL (ref 30.0–36.0)
MCV: 90 fL (ref 78.0–100.0)
MONOS PCT: 4 %
Monocytes Absolute: 0.5 10*3/uL (ref 0.1–1.0)
NEUTROS PCT: 73 %
Neutro Abs: 8.4 10*3/uL — ABNORMAL HIGH (ref 1.7–7.7)
Platelets: 281 10*3/uL (ref 150–400)
RBC: 3.9 MIL/uL (ref 3.87–5.11)
RDW: 12.6 % (ref 11.5–15.5)
WBC: 11.6 10*3/uL — ABNORMAL HIGH (ref 4.0–10.5)

## 2015-12-22 NOTE — Progress Notes (Addendum)
Subjective: Patient reports feeling better but still in pain The right forearm drained bloody purulence.    Objective: I have reviewed patient's vital signs, intake and output, medications, labs and microbiology.  GENERAL: Well-developed, well-nourished female in no acute distress.  LUNGS: Clear to auscultation bilaterally.  HEART: Regular rate and rhythm. PELVIC: Normal external female genitalia. Left vulva is also coalescing mostly tissue induration but there some fluctuance inferiorly EXTREMITIES: No cyanosis, clubbing, or edema, 2+ distal pulses. Left occipital scalp is coalescing red tender not draining the pick site is scabbing Right forearm is wrapped The skin pick sites are all scabbing equally  Results for orders placed or performed during the hospital encounter of 12/19/15 (from the past 48 hour(s))  CBC with Differential/Platelet   Collection Time: 12/22/15  5:47 AM  Result Value Ref Range   WBC 11.6 (H) 4.0 - 10.5 K/uL   RBC 3.90 3.87 - 5.11 MIL/uL   Hemoglobin 11.9 (L) 12.0 - 15.0 g/dL   HCT 16.135.1 (L) 09.636.0 - 04.546.0 %   MCV 90.0 78.0 - 100.0 fL   MCH 30.5 26.0 - 34.0 pg   MCHC 33.9 30.0 - 36.0 g/dL   RDW 40.912.6 81.111.5 - 91.415.5 %   Platelets 281 150 - 400 K/uL   Neutrophils Relative % 73 %   Neutro Abs 8.4 (H) 1.7 - 7.7 K/uL   Lymphocytes Relative 21 %   Lymphs Abs 2.4 0.7 - 4.0 K/uL   Monocytes Relative 4 %   Monocytes Absolute 0.5 0.1 - 1.0 K/uL   Eosinophils Relative 2 %   Eosinophils Absolute 0.3 0.0 - 0.7 K/uL   Basophils Relative 0 %   Basophils Absolute 0.0 0.0 - 0.1 K/uL     Assessment/Plan: Left vulvar abscess Left occipital scalp phlegmon Right forearm abscess  Drug abuse Afebrile for 24 hours  Continue vancomycin 750 mg IV BID Nicotine patch subutex 8 mg daily    LOS: 3 days    Alexis Avery 12/22/2015, 12:34 PM

## 2015-12-23 DIAGNOSIS — N760 Acute vaginitis: Secondary | ICD-10-CM

## 2015-12-23 DIAGNOSIS — F1120 Opioid dependence, uncomplicated: Secondary | ICD-10-CM

## 2015-12-23 LAB — CBC WITH DIFFERENTIAL/PLATELET
BASOS ABS: 0.1 10*3/uL (ref 0.0–0.1)
BASOS PCT: 1 %
EOS ABS: 0.3 10*3/uL (ref 0.0–0.7)
Eosinophils Relative: 3 %
HEMATOCRIT: 34.8 % — AB (ref 36.0–46.0)
Hemoglobin: 11.6 g/dL — ABNORMAL LOW (ref 12.0–15.0)
Lymphocytes Relative: 25 %
Lymphs Abs: 2.5 10*3/uL (ref 0.7–4.0)
MCH: 30.3 pg (ref 26.0–34.0)
MCHC: 33.3 g/dL (ref 30.0–36.0)
MCV: 90.9 fL (ref 78.0–100.0)
MONO ABS: 0.6 10*3/uL (ref 0.1–1.0)
Monocytes Relative: 6 %
NEUTROS ABS: 6.4 10*3/uL (ref 1.7–7.7)
Neutrophils Relative %: 65 %
PLATELETS: 323 10*3/uL (ref 150–400)
RBC: 3.83 MIL/uL — ABNORMAL LOW (ref 3.87–5.11)
RDW: 12.2 % (ref 11.5–15.5)
WBC: 9.9 10*3/uL (ref 4.0–10.5)

## 2015-12-23 LAB — VANCOMYCIN, TROUGH: VANCOMYCIN TR: 5 ug/mL — AB (ref 15–20)

## 2015-12-23 LAB — MRSA PCR SCREENING: MRSA BY PCR: POSITIVE — AB

## 2015-12-23 MED ORDER — VANCOMYCIN HCL 1000 MG IV SOLR
750.0000 mg | Freq: Three times a day (TID) | INTRAVENOUS | Status: DC
  Filled 2015-12-23 (×3): qty 750

## 2015-12-23 MED ORDER — SULFAMETHOXAZOLE-TRIMETHOPRIM 800-160 MG PO TABS: 1.0000 | ORAL_TABLET | Freq: Two times a day (BID) | ORAL | 1 refills | Status: DC

## 2015-12-23 MED ORDER — SULFAMETHOXAZOLE-TRIMETHOPRIM 800-160 MG PO TABS: 2.0000 | ORAL_TABLET | Freq: Two times a day (BID) | ORAL | 0 refills | Status: AC

## 2015-12-23 NOTE — Discharge Instructions (Signed)

## 2015-12-23 NOTE — Progress Notes (Signed)
Pharmacy Antibiotic Note  Alexis Avery is a 35 y.o. female admitted on 12/19/2015 with labial abscess.  Pharmacy has been consulted for vancomycin dosing.   Trough was drawn prior to the fifth dose of current dosing regimen and found to be subtherapeutic at 5 mcg/ml (goal 15-20 mcg/ml). Will increase frequency to better target goal serum concentration.  Plan: 1. Vancomycin 750 mg IV q8h  2. Follow up level as clinically appropriate. Will continue to follow renal function and clinical improvement.   Height: 4\' 9"  (144.8 cm) Weight: 85 lb 4 oz (38.7 kg) IBW/kg (Calculated) : 38.6  Temp (24hrs), Avg:98.9 F (37.2 C), Min:98.4 F (36.9 C), Max:99.2 F (37.3 C)   Recent Labs Lab 12/19/15 1921 12/20/15 0336 12/22/15 0547 12/23/15 0129  WBC 16.6* 20.2* 11.6* 9.9  CREATININE 0.53  --   --   --   VANCOTROUGH  --   --   --  5*    Estimated Creatinine Clearance: 60.9 mL/min (by C-G formula based on SCr of 0.53 mg/dL).    No Known Allergies  Antimicrobials this admission: vanc 12/10 >>  Azithromycin x 1 12/9 Ceftriaxone x 1 12/9  Dose adjustments this admission: vanc 500 mg IV q 12h given on 12/10 x 2, then empirically increased to 750 mg IV q 12h based on MD recommendation.  Microbiology results: None drawn  Thank you for allowing pharmacy to be a part of this patient's care.  Lenore MannerHolcombe, Jomayra Novitsky [place] 12/23/2015 7:36 AM

## 2015-12-23 NOTE — Progress Notes (Signed)

## 2015-12-23 NOTE — Discharge Summary (Addendum)
Physician Discharge Summary  Patient ID: Alexis Avery MRN: [id number] DOB/AGE: 09/04/1980 35 y.o.  Admit date: 12/19/2015 Discharge date: 12/23/2015  Admission Diagnoses:  Discharge Diagnoses:  Active Problems:   Opiate dependence (HCC)   Labial infection   Discharged Condition: good  Hospital Course: Patient admitted for labial, occipital, and right forearm infection. Placed on Vancomycin with steady improvement. No drainage from vulvar abscess. Pt being discharged to day to complete 10 days of oral antibiotics. Nasal swab today shows MRSA colonization. Will treat orally with bactrim 2 DS BID. F/u in office.  Consults: None  Significant Diagnostic Studies:   Treatments: antibiotics: vancomycin  Discharge Exam: Blood pressure 120/79, pulse 78, temperature 98.4 F (36.9 C), temperature source Oral, resp. rate 18, height 4\' 9"  (1.448 m), weight 85 lb 4 oz (38.7 kg), last menstrual period 12/06/2015, SpO2 98 %. General appearance: alert, cooperative and no distress Resp: clear to auscultation bilaterally Cardio: regular rate and rhythm, S1, S2 normal, no murmur, click, rub or gallop GI: soft, non-tender; bowel sounds normal; no masses,  no organomegaly Pelvic: left labia slightly erythematous. Mild induration. No fluctuant area. Extremities: extremities normal, atraumatic, no cyanosis or edema Skin: 4cm indurated area on left occipital area. Draining right forearm abscess.  Disposition: 01-Home or Self Care  Discharge Instructions    Call MD for:  difficulty breathing, headache or visual disturbances    Complete by:  As directed    Call MD for:  hives    Complete by:  As directed    Call MD for:  persistant nausea and vomiting    Complete by:  As directed    Call MD for:  redness, tenderness, or signs of infection (pain, swelling, redness, odor or green/yellow discharge around incision site)    Complete by:  As directed    Call MD for:  severe uncontrolled pain     Complete by:  As directed    Call MD for:  temperature >100.4    Complete by:  As directed    Diet - low sodium heart healthy    Complete by:  As directed    Increase activity slowly    Complete by:  As directed        Medication List    TAKE these medications   aspirin-acetaminophen-caffeine 250-250-65 MG tablet Commonly known as:  EXCEDRIN MIGRAINE Take 2 tablets by mouth every 6 (six) hours as needed for headache (or general pain).   SUBOXONE 8-2 MG Film Generic drug:  Buprenorphine HCl-Naloxone HCl Place 1 Film under the tongue 3 (three) times daily. Dissolve on tongue three times daily   sulfamethoxazole-trimethoprim 800-160 MG tablet Commonly known as:  BACTRIM DS,SEPTRA DS Take 2 tablets by mouth 2 (two) times daily.      Follow-up Information    [facility] Follow up.   Specialty:  Behavioral Health Why:  Follow up if wanting to establish services for outpatient provider  Contact information: [address] [place] [address] [phone]        [facility]. Call.   Why:  Another option if wanting to establish services Contact information: [address] [place], [place] [address] [phone]         >30 minutes spent on discharge  Signed: Candelaria CelesteSTINSON, JACOB JEHIEL 12/23/2015, 10:20 AM

## 2016-03-30 ENCOUNTER — Emergency Department (HOSPITAL_BASED_OUTPATIENT_CLINIC_OR_DEPARTMENT_OTHER)
Admission: EM | Admit: 2016-03-30 | Discharge: 2016-03-30 | Disposition: A | Discharge status: Home / Self Care | Payer: Medicaid Other | Attending: Emergency Medicine | Admitting: Emergency Medicine

## 2016-03-30 ENCOUNTER — Encounter (HOSPITAL_BASED_OUTPATIENT_CLINIC_OR_DEPARTMENT_OTHER): Payer: Self-pay | Admitting: Emergency Medicine

## 2016-03-30 DIAGNOSIS — L03213 Periorbital cellulitis: Secondary | ICD-10-CM | POA: Diagnosis not present

## 2016-03-30 DIAGNOSIS — F17210 Nicotine dependence, cigarettes, uncomplicated: Secondary | ICD-10-CM | POA: Insufficient documentation

## 2016-03-30 DIAGNOSIS — R220 Localized swelling, mass and lump, head: Secondary | ICD-10-CM | POA: Diagnosis present

## 2016-03-30 HISTORY — DX: Carrier or suspected carrier of methicillin resistant Staphylococcus aureus: Z22.322

## 2016-03-30 MED ORDER — SULFAMETHOXAZOLE-TRIMETHOPRIM 800-160 MG PO TABS: 1.0000 | ORAL_TABLET | Freq: Two times a day (BID) | ORAL | 0 refills | Status: AC

## 2016-03-30 MED ORDER — CEPHALEXIN 500 MG PO CAPS: 500.0000 mg | ORAL_CAPSULE | Freq: Four times a day (QID) | ORAL | 0 refills | Status: DC

## 2016-03-30 MED FILL — CEPHALEXIN 500 MG CAPSULE: 500 | 7 days supply | Qty: 28 | Fill #0

## 2016-03-30 MED FILL — SULFAMETHOXAZOLE/TMP DS TAB: 800-160 | 7 days supply | Qty: 14 | Fill #0

## 2016-03-30 NOTE — Discharge Instructions (Signed)
Keflex and Bactrim as prescribed. ° °Return to the emergency department if symptoms significantly worsen or change. °

## 2016-03-30 NOTE — ED Triage Notes (Signed)
Pt c/o LT side facial swelling and pain since yesterday

## 2016-03-30 NOTE — ED Provider Notes (Signed)
[facility]   CSN: [id number] Arrival date & time: 03/30/16  0904     History   Chief Complaint Chief Complaint  Patient presents with  . Facial Swelling    HPI Alexis Avery is a 36 y.o. female.  Patient is a 36 year old female with past medical history of MRSA infections, heroin abuse. She presents for evaluation of swelling and pain around her left eye. This began 2 days ago in the absence of any injury or trauma. She denies fevers or chills. She denies any eye pain or visual disturbances. She has tried no medications at home and there are no aggravating factors.   The history is provided by the patient.    Past Medical History:  Diagnosis Date  . Anxiety   . Contraceptive education 06/07/2013  . Depression   . Gestational diabetes mellitus, antepartum    GESTATIONAL  . Heroin abuse   . MRSA (methicillin resistant staph aureus) culture positive   . Nausea and vomiting in pregnancy 10/24/2012  . Nicotine addiction 10/24/2012  . Pregnant 10/24/2012  . Preterm labor     Patient Active Problem List   Diagnosis Date Noted  . Labial infection 12/19/2015  . Opiate dependence (HCC) 01/21/2014  . Contraceptive education 06/07/2013  . Gestational diabetes 04/18/2013  . Breech or malpresentation convert to cephalic presentation antepartum 04/07/2013  . Breech presentation 04/04/2013  . Gestational diabetes mellitus, class A2 04/04/2013  . Abnormal maternal glucose tolerance, antepartum 03/21/2013  . History of preterm delivery, currently pregnant 01/16/2013  . Drug use complicating pregnancy 01/16/2013  . Nausea and vomiting in pregnancy 10/24/2012  . Nicotine addiction 10/24/2012  . Anxiety 10/24/2012    Past Surgical History:  Procedure Laterality Date  . CHOLECYSTECTOMY    . TONSILLECTOMY    . VAGINAL DELIVERY     X 1    OB History    Gravida Para Term Preterm AB Living   3 2 1 1 1 2    SAB TAB Ectopic Multiple Live Births   1        2       Home Medications    Prior to Admission medications   Medication Sig Start Date End Date Taking? Authorizing Provider  aspirin-acetaminophen-caffeine (EXCEDRIN MIGRAINE) [id number] MG tablet Take 2 tablets by mouth every 6 (six) hours as needed for headache (or general pain).    Historical Provider, MD  SUBOXONE 8-2 MG FILM Place 1 Film under the tongue 3 (three) times daily. Dissolve on tongue three times daily 03/06/15   Historical Provider, MD    Family History Family History  Problem Relation Age of Onset  . Hypertension Mother   . Cancer Maternal Grandmother     breast  . Hypertension Maternal Uncle   . Heart attack Maternal Uncle   . Heart attack Father     Social History Social History  Substance Use Topics  . Smoking status: Current Every Day Smoker    Packs/day: 0.50    Types: Cigarettes  . Smokeless tobacco: Never Used  . Alcohol use No     Allergies   Patient has no known allergies.   Review of Systems Review of Systems  All other systems reviewed and are negative.    Physical Exam Updated Vital Signs BP (!) 143/106 (BP Location: Left Arm)   Pulse 85   Temp 98.2 F (36.8 C) (Oral)   Resp 18   Ht 4\' 9"  (1.448 m)   Wt 100 lb (45.4  kg)   LMP 03/23/2016   SpO2 100%   BMI 21.64 kg/m   Physical Exam  Constitutional: She is oriented to person, place, and time. She appears well-developed and well-nourished. No distress.  HENT:  Head: Normocephalic and atraumatic.  Eyes: EOM are normal. Pupils are equal, round, and reactive to light.  There is swelling and erythema of the peri-orbital soft tissues on the left. She has full range of eye movements with no discomfort. There is no proptosis.  Neck: Normal range of motion. Neck supple.  Lymphadenopathy:    She has no cervical adenopathy.  Neurological: She is alert and oriented to person, place, and time.  Skin: Skin is warm and dry. She is not diaphoretic.  Nursing note and vitals  reviewed.    ED Treatments / Results  Labs (all labs ordered are listed, but only abnormal results are displayed) Labs Reviewed - No data to display  EKG  EKG Interpretation None       Radiology No results found.  Procedures Procedures (including critical care time)  Medications Ordered in ED Medications - No data to display   Initial Impression / Assessment and Plan / ED Course  I have reviewed the triage vital signs and the nursing notes.  Pertinent labs & imaging results that were available during my care of the patient were reviewed by me and considered in my medical decision making (see chart for details).  This appears to be a periorbital cellulitis. This will be treated with Keflex and Bactrim given her history of MRSA. I see no evidence of an orbital cellulitis and I do not feel as though imaging studies are required at this time.  Final Clinical Impressions(s) / ED Diagnoses   Final diagnoses:  None    New Prescriptions New Prescriptions   No medications on file     Geoffery Lyons, MD 03/30/16 [phone]

## 2016-09-30 ENCOUNTER — Encounter (HOSPITAL_COMMUNITY): Payer: Self-pay | Admitting: Emergency Medicine

## 2016-09-30 ENCOUNTER — Ambulatory Visit (HOSPITAL_COMMUNITY)
Admission: EM | Admit: 2016-09-30 | Discharge: 2016-09-30 | Disposition: A | Discharge status: Home / Self Care | Payer: Medicaid Other | Attending: Internal Medicine | Admitting: Internal Medicine

## 2016-09-30 DIAGNOSIS — Z3201 Encounter for pregnancy test, result positive: Secondary | ICD-10-CM

## 2016-09-30 LAB — POCT PREGNANCY, URINE: PREG TEST UR: POSITIVE — AB

## 2016-09-30 NOTE — ED Triage Notes (Signed)
Pt here for poss pregnancy  Has had 2 positive tests at home  LMP = 07/24/2016  A&O x4... NAD... Ambulatory

## 2016-09-30 NOTE — ED Provider Notes (Signed)
[facility]    CSN: [id number] Arrival date & time: 09/30/16  1348     History   Chief Complaint Chief Complaint  Patient presents with  . Possible Pregnancy    HPI Alexis Avery is a 36 y.o. female.   36 year old female G3P1112 comes in for possible pregnancy. Patient states that she has had 2 positive pregnancy test at home and need confirmation for pregnancy medicaid. LMP 07/24/2016. She states she will be looking for an OB once insurance goes through. Denies urinary urgency, dysuria, hematuria. Denies vaginal discharge, itching/pain, spotting/bleeding. Denies abdominal pain, nausea, vomiting, diarrhea, constipation.       Past Medical History:  Diagnosis Date  . Anxiety   . Contraceptive education 06/07/2013  . Depression   . Gestational diabetes mellitus, antepartum    GESTATIONAL  . Heroin abuse   . MRSA (methicillin resistant staph aureus) culture positive   . Nausea and vomiting in pregnancy 10/24/2012  . Nicotine addiction 10/24/2012  . Pregnant 10/24/2012  . Preterm labor     Patient Active Problem List   Diagnosis Date Noted  . Labial infection 12/19/2015  . Opiate dependence (HCC) 01/21/2014  . Contraceptive education 06/07/2013  . Gestational diabetes 04/18/2013  . Breech or malpresentation convert to cephalic presentation antepartum 04/07/2013  . Breech presentation 04/04/2013  . Gestational diabetes mellitus, class A2 04/04/2013  . Abnormal maternal glucose tolerance, antepartum 03/21/2013  . History of preterm delivery, currently pregnant 01/16/2013  . Drug use complicating pregnancy 01/16/2013  . Nausea and vomiting in pregnancy 10/24/2012  . Nicotine addiction 10/24/2012  . Anxiety 10/24/2012    Past Surgical History:  Procedure Laterality Date  . CHOLECYSTECTOMY    . TONSILLECTOMY    . VAGINAL DELIVERY     X 1    OB History    Gravida Para Term Preterm AB Living   SAB TAB Ectopic Multiple Live Births   1        2       Home Medications    Prior to Admission medications   Medication Sig Start Date End Date Taking? Authorizing Provider  aspirin-acetaminophen-caffeine (EXCEDRIN MIGRAINE) [phone] MG tablet Take 2 tablets by mouth every 6 (six) hours as needed for headache (or general pain).    [provider]  cephALEXin (KEFLEX) 500 MG capsule Take 1 capsule (500 mg total) by mouth 4 (four) times daily. 03/30/16   Geoffery Lyons, MD  SUBOXONE 8-2 MG FILM Place 1 Film under the tongue 3 (three) times daily. Dissolve on tongue three times daily 03/06/15   [provider]    Family History Family History  Problem Relation Age of Onset  . Hypertension Mother   . Cancer Maternal Grandmother        breast  . Hypertension Maternal Uncle   . Heart attack Maternal Uncle   . Heart attack Father     Social History Social History  Substance Use Topics  . Smoking status: Current Every Day Smoker    Packs/day: 0.50    Types: Cigarettes  . Smokeless tobacco: Never Used  . Alcohol use No     Allergies   Patient has no known allergies.   Review of Systems Review of Systems  Reason unable to perform ROS: See HPI as above.     Physical Exam Triage Vital Signs ED Triage Vitals [09/30/16 1522]  Enc Vitals Group     BP (!) 145/96  Pulse Rate 93     Resp 16     Temp 98.5 F (36.9 C)     Temp Source Oral     SpO2 100 %     Weight      Height      Head Circumference      Peak Flow      Pain Score      Pain Loc      Pain Edu?      Excl. in GC?    No data found.   Updated Vital Signs BP (!) 145/96 (BP Location: Left Arm)   Pulse 93   Temp 98.5 F (36.9 C) (Oral)   Resp 16   LMP 07/24/2016 (Approximate)   SpO2 100%   Physical Exam  Constitutional: She is oriented to person, place, and time. She appears well-developed and well-nourished. No distress.  HENT:  Head: Normocephalic and atraumatic.  Eyes: Pupils are equal, round, and reactive to light.  Conjunctivae are normal.  Cardiovascular: Normal rate, regular rhythm and normal heart sounds.  Exam reveals no gallop and no friction rub.   No murmur heard. Pulmonary/Chest: Effort normal and breath sounds normal. She has no wheezes. She has no rales.  Abdominal: Soft. Bowel sounds are normal. There is no tenderness. There is no rebound, no guarding and no CVA tenderness.  Neurological: She is alert and oriented to person, place, and time.     UC Treatments / Results  Labs (all labs ordered are listed, but only abnormal results are displayed) Labs Reviewed  POCT PREGNANCY, URINE - Abnormal; Notable for the following:       Result Value   Preg Test, Ur POSITIVE (*)    All other components within normal limits    EKG  EKG Interpretation None       Radiology No results found.  Procedures Procedures (including critical care time)  Medications Ordered in UC Medications - No data to display   Initial Impression / Assessment and Plan / UC Course  I have reviewed the triage vital signs and the nursing notes.  Pertinent labs & imaging results that were available during my care of the patient were reviewed by me and considered in my medical decision making (see chart for details).    Pregnancy test positive. Start prenatal vitamins. Patient to follow up with OB/GYN for further management. Return precautions given.  Final Clinical Impressions(s) / UC Diagnoses   Final diagnoses:  Pregnancy test positive    New Prescriptions New Prescriptions   No medications on file      Lurline Idol 09/30/16 1632

## 2016-09-30 NOTE — Discharge Instructions (Signed)
Pregnancy test positive. Start prenatal vitamins. To schedule appointment with OB/GYN for further management of pregnancy. If experiencing abdominal pain, fever, vaginal bleeding, follow up at the emergency department for further evaluation.

## 2016-12-20 ENCOUNTER — Other Ambulatory Visit: Payer: Self-pay

## 2016-12-20 ENCOUNTER — Emergency Department (HOSPITAL_BASED_OUTPATIENT_CLINIC_OR_DEPARTMENT_OTHER)
Admission: EM | Admit: 2016-12-20 | Discharge: 2016-12-20 | Disposition: A | Discharge status: Home / Self Care | Payer: Medicaid Other | Attending: Emergency Medicine | Admitting: Emergency Medicine

## 2016-12-20 ENCOUNTER — Encounter (HOSPITAL_BASED_OUTPATIENT_CLINIC_OR_DEPARTMENT_OTHER): Payer: Self-pay | Admitting: *Deleted

## 2016-12-20 DIAGNOSIS — Z5321 Procedure and treatment not carried out due to patient leaving prior to being seen by health care provider: Secondary | ICD-10-CM | POA: Insufficient documentation

## 2016-12-20 DIAGNOSIS — R600 Localized edema: Secondary | ICD-10-CM | POA: Diagnosis not present

## 2016-12-20 NOTE — ED Notes (Signed)
No answer when called for tx room  

## 2016-12-20 NOTE — ED Triage Notes (Addendum)
Pt c/o left eye facial swelling x 3 days, pt is 6 months preg

## 2016-12-20 NOTE — ED Notes (Signed)
Pt called x 2 no answer , no found in lobby, bistro or parking lot

## 2017-01-07 ENCOUNTER — Inpatient Hospital Stay (HOSPITAL_COMMUNITY): Payer: Medicaid Other

## 2017-01-07 ENCOUNTER — Observation Stay (HOSPITAL_COMMUNITY)
Admission: AD | Admit: 2017-01-07 | Discharge: 2017-01-08 | Discharge status: Against Medical Advice | Payer: Medicaid Other | Source: Ambulatory Visit | Attending: Obstetrics and Gynecology | Admitting: Obstetrics and Gynecology

## 2017-01-07 ENCOUNTER — Encounter (HOSPITAL_COMMUNITY): Payer: Self-pay | Admitting: *Deleted

## 2017-01-07 DIAGNOSIS — Z22322 Carrier or suspected carrier of Methicillin resistant Staphylococcus aureus: Secondary | ICD-10-CM

## 2017-01-07 DIAGNOSIS — O0930 Supervision of pregnancy with insufficient antenatal care, unspecified trimester: Secondary | ICD-10-CM

## 2017-01-07 DIAGNOSIS — O99343 Other mental disorders complicating pregnancy, third trimester: Secondary | ICD-10-CM | POA: Diagnosis not present

## 2017-01-07 DIAGNOSIS — O99333 Smoking (tobacco) complicating pregnancy, third trimester: Secondary | ICD-10-CM | POA: Insufficient documentation

## 2017-01-07 DIAGNOSIS — Z3A32 32 weeks gestation of pregnancy: Secondary | ICD-10-CM

## 2017-01-07 DIAGNOSIS — O0993 Supervision of high risk pregnancy, unspecified, third trimester: Principal | ICD-10-CM | POA: Insufficient documentation

## 2017-01-07 DIAGNOSIS — R109 Unspecified abdominal pain: Secondary | ICD-10-CM

## 2017-01-07 DIAGNOSIS — O139 Gestational [pregnancy-induced] hypertension without significant proteinuria, unspecified trimester: Secondary | ICD-10-CM | POA: Diagnosis present

## 2017-01-07 DIAGNOSIS — O0932 Supervision of pregnancy with insufficient antenatal care, second trimester: Secondary | ICD-10-CM

## 2017-01-07 DIAGNOSIS — F17210 Nicotine dependence, cigarettes, uncomplicated: Secondary | ICD-10-CM | POA: Insufficient documentation

## 2017-01-07 DIAGNOSIS — F419 Anxiety disorder, unspecified: Secondary | ICD-10-CM | POA: Diagnosis not present

## 2017-01-07 DIAGNOSIS — O26893 Other specified pregnancy related conditions, third trimester: Secondary | ICD-10-CM

## 2017-01-07 DIAGNOSIS — O133 Gestational [pregnancy-induced] hypertension without significant proteinuria, third trimester: Secondary | ICD-10-CM | POA: Diagnosis not present

## 2017-01-07 DIAGNOSIS — O0933 Supervision of pregnancy with insufficient antenatal care, third trimester: Secondary | ICD-10-CM

## 2017-01-07 DIAGNOSIS — O99323 Drug use complicating pregnancy, third trimester: Secondary | ICD-10-CM

## 2017-01-07 HISTORY — DX: Abscess of vulva: N76.4

## 2017-01-07 HISTORY — DX: Gestational (pregnancy-induced) hypertension without significant proteinuria, unspecified trimester: O13.9

## 2017-01-07 LAB — URINALYSIS, ROUTINE W REFLEX MICROSCOPIC
BILIRUBIN URINE: NEGATIVE
GLUCOSE, UA: NEGATIVE mg/dL
HGB URINE DIPSTICK: NEGATIVE
Ketones, ur: 80 mg/dL — AB
Leukocytes, UA: NEGATIVE
Nitrite: NEGATIVE
Protein, ur: NEGATIVE mg/dL
SPECIFIC GRAVITY, URINE: 1.023 (ref 1.005–1.030)
pH: 6 (ref 5.0–8.0)

## 2017-01-07 LAB — PROTEIN / CREATININE RATIO, URINE
Creatinine, Urine: 114 mg/dL
Protein Creatinine Ratio: 0.19 mg/mg{Cre} — ABNORMAL HIGH (ref 0.00–0.15)
TOTAL PROTEIN, URINE: 22 mg/dL

## 2017-01-07 LAB — CBC WITH DIFFERENTIAL/PLATELET
BASOS ABS: 0 10*3/uL (ref 0.0–0.1)
BASOS PCT: 0 %
EOS ABS: 0.1 10*3/uL (ref 0.0–0.7)
Eosinophils Relative: 1 %
HCT: 30.2 % — ABNORMAL LOW (ref 36.0–46.0)
HEMOGLOBIN: 10.4 g/dL — AB (ref 12.0–15.0)
Lymphocytes Relative: 22 %
Lymphs Abs: 2.2 10*3/uL (ref 0.7–4.0)
MCH: 31.1 pg (ref 26.0–34.0)
MCHC: 34.4 g/dL (ref 30.0–36.0)
MCV: 90.4 fL (ref 78.0–100.0)
Monocytes Absolute: 0.3 10*3/uL (ref 0.1–1.0)
Monocytes Relative: 3 %
NEUTROS PCT: 74 %
Neutro Abs: 7.3 10*3/uL (ref 1.7–7.7)
Platelets: 209 10*3/uL (ref 150–400)
RBC: 3.34 MIL/uL — AB (ref 3.87–5.11)
RDW: 13.4 % (ref 11.5–15.5)
WBC: 9.9 10*3/uL (ref 4.0–10.5)

## 2017-01-07 LAB — COMPREHENSIVE METABOLIC PANEL
ALBUMIN: 3.1 g/dL — AB (ref 3.5–5.0)
ALT: 11 U/L — AB (ref 14–54)
ANION GAP: 12 (ref 5–15)
AST: 22 U/L (ref 15–41)
Alkaline Phosphatase: 136 U/L — ABNORMAL HIGH (ref 38–126)
BUN: 17 mg/dL (ref 6–20)
CALCIUM: 8.6 mg/dL — AB (ref 8.9–10.3)
CO2: 19 mmol/L — AB (ref 22–32)
Chloride: 104 mmol/L (ref 101–111)
Creatinine, Ser: 0.41 mg/dL — ABNORMAL LOW (ref 0.44–1.00)
GFR calc Af Amer: >60 mL/min (ref >60)
GFR calc non Af Amer: >60 mL/min (ref >60)
GLUCOSE: 78 mg/dL (ref 65–99)
Potassium: 3.5 mmol/L (ref 3.5–5.1)
SODIUM: 135 mmol/L (ref 135–145)
Total Bilirubin: 0.6 mg/dL (ref 0.3–1.2)
Total Protein: 6.6 g/dL (ref 6.5–8.1)

## 2017-01-07 LAB — RAPID URINE DRUG SCREEN, HOSP PERFORMED
AMPHETAMINES: NOT DETECTED
Barbiturates: NOT DETECTED
Benzodiazepines: NOT DETECTED
Cocaine: NOT DETECTED
Opiates: NOT DETECTED
TETRAHYDROCANNABINOL: POSITIVE — AB

## 2017-01-07 LAB — ABO/RH: ABO/RH(D): O POS

## 2017-01-07 LAB — MRSA PCR SCREENING: MRSA BY PCR: POSITIVE — AB

## 2017-01-07 MED ORDER — MUPIROCIN CALCIUM 2 % EX CREA
TOPICAL_CREAM | Freq: Two times a day (BID) | CUTANEOUS | Status: DC
  Administered 2017-01-07: 22:00:00 via TOPICAL
  Filled 2017-01-07: qty 30

## 2017-01-07 MED ORDER — DOCUSATE SODIUM 100 MG PO CAPS: 100.0000 mg | ORAL_CAPSULE | Freq: Two times a day (BID) | ORAL | Status: DC | PRN

## 2017-01-07 MED ORDER — ZOLPIDEM TARTRATE 5 MG PO TABS
5.0000 mg | ORAL_TABLET | Freq: Every evening | ORAL | Status: DC | PRN
  Administered 2017-01-07: 5 mg via ORAL
  Filled 2017-01-07: qty 1

## 2017-01-07 MED ORDER — CALCIUM CARBONATE ANTACID 500 MG PO CHEW: 2.0000 | CHEWABLE_TABLET | ORAL | Status: DC | PRN

## 2017-01-07 MED ORDER — PRENATAL MULTIVITAMIN CH: 1.0000 | ORAL_TABLET | Freq: Every day | ORAL | Status: DC

## 2017-01-07 MED ORDER — BUTALBITAL-APAP-CAFFEINE 50-325-40 MG PO TABS
2.0000 | ORAL_TABLET | Freq: Once | ORAL | Status: AC
  Administered 2017-01-07: 2 via ORAL
  Filled 2017-01-07: qty 2

## 2017-01-07 MED ORDER — ACETAMINOPHEN 325 MG PO TABS
650.0000 mg | ORAL_TABLET | ORAL | Status: DC | PRN
  Administered 2017-01-07 – 2017-01-08 (×2): 650 mg via ORAL
  Filled 2017-01-07 (×2): qty 2

## 2017-01-07 MED ORDER — MUPIROCIN 2 % EX OINT
TOPICAL_OINTMENT | Freq: Two times a day (BID) | CUTANEOUS | Status: DC
  Filled 2017-01-07: qty 22

## 2017-01-07 NOTE — H&P (Signed)
History   [phone] [id number] no prenatal care this pregnancy in with abd pain and pressure. States just did not get care yert. states does smoke but does not drink or do drugs. Denies headache, blurred vision or epigastric pain.  CSN: [id number]  Arrival date & time 01/07/17  1527   None     No chief complaint on file.   HPI      Past Medical History:  Diagnosis Date  . Anxiety   . Contraceptive education 06/07/2013  . Depression   . Gestational diabetes mellitus, antepartum    GESTATIONAL  . 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 MRSA (methicillin resistant staph aureus) culture positive   . Nausea and vomiting in pregnancy 10/24/2012  . Nicotine addiction 10/24/2012  . Pregnant 10/24/2012  . Preterm labor     Past Surgical History:  Procedure Laterality Date  . CHOLECYSTECTOMY    . TONSILLECTOMY    . VAGINAL DELIVERY     X 1         Family History  Problem Relation Age of Onset  . Hypertension Mother   . Cancer Maternal Grandmother        breast  . Hypertension Maternal Uncle   . Heart attack Maternal Uncle   . Heart attack Father     Social History        Tobacco Use  . Smoking status: Current Every Day Smoker    Packs/day: 0.50    Types: Cigarettes  . Smokeless tobacco: Never Used  Substance Use Topics  . Alcohol use: No  . Drug use: No    Comment: not used heroin since Feb 2018            OB History    Gravida Para Term Preterm AB Living   4 2 1 1 1 2    SAB TAB Ectopic Multiple Live Births   1       2      Review of Systems  Constitutional: Negative.   HENT: Negative.   Eyes: Negative.   Respiratory: Negative.   Cardiovascular: Negative.   Gastrointestinal: Positive for abdominal pain.  Endocrine: Negative.   Genitourinary: Negative.   Musculoskeletal: Negative.   Skin: Negative.   Allergic/Immunologic: Negative.   Neurological: Negative.   Hematological: Negative.   Psychiatric/Behavioral: Negative.      Allergies  Patient has no known allergies.  Home Medications    BP (!) 142/95   Pulse (!) 108   Temp 98.7 F (37.1 C)   Resp 18   Ht 4\' 9"  (1.448 m)   Wt 112 lb (50.8 kg)   LMP 07/24/2016 (Approximate)   BMI 24.24 kg/m   Physical Exam  Constitutional: She is oriented to person, place, and time. She appears well-developed and well-nourished.  HENT:  Head: Normocephalic.  Eyes: Pupils are equal, round, and reactive to light.  Neck: Normal range of motion.  Cardiovascular: Normal rate, regular rhythm, normal heart sounds and intact distal pulses.  Pulmonary/Chest: Effort normal and breath sounds normal.  Abdominal: Soft. Bowel sounds are normal.  Genitourinary: Vagina normal and uterus normal.  Musculoskeletal: Normal range of motion.  Neurological: She is alert and oriented to person, place, and time. She has normal reflexes.  Skin: Skin is warm and dry.  Psychiatric: She has a normal mood and affect. Her behavior is normal. Judgment and thought content normal.    MAU Course  Procedures (including critical care time)  Labs Reviewed  URINALYSIS, ROUTINE W REFLEX MICROSCOPIC  RAPID  URINE DRUG SCREEN, HOSP PERFORMED  CBC WITH DIFFERENTIAL/PLATELET  COMPREHENSIVE METABOLIC PANEL  RPR  HIV ANTIBODY (ROUTINE TESTING)  PROTEIN / CREATININE RATIO, URINE  ABO/RH   ImagingResults(Last48hours)  No results found.     Dx: PIH No PNC Drug use in preg    MDM  SVE ft/post/th/high. BP elevated, no edema, DTR's tr no clonus. PIH labs drawn. Dr. Vergie LivingPickens consulted. Pt to be observation for 24 hrs to evaluate BP's.

## 2017-01-07 NOTE — Progress Notes (Signed)
This nurse came back from safety rounds and patient not in the room.  This is after I have informed patient that her MRSA nasal swab was positive at 2039.  Dr Vergie LivingPickens informed by phone.  Ordered NSL to be discontinued and continue to monitor.

## 2017-01-07 NOTE — MAU Note (Signed)
Pt c/o increased abd pressure since earlier this morning. Not sure if they are preterm labor.denies any vaginal discharge or bleeding.

## 2017-01-07 NOTE — MAU Provider Note (Signed)
History   [phone] no prenatal care this pregnancy in with abd pain and pressure. States just did not get care yert. states does smoke but does not drink or do drugs. Denies headache, blurred vision or epigastric pain.  CSN: [id number]  Arrival date & time 01/07/17  1527   None     No chief complaint on file.   HPI  Past Medical History:  Diagnosis Date  . Anxiety   . Contraceptive education 06/07/2013  . Depression   . Gestational diabetes mellitus, antepartum    GESTATIONAL  . 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 MRSA (methicillin resistant staph aureus) culture positive   . Nausea and vomiting in pregnancy 10/24/2012  . Nicotine addiction 10/24/2012  . Pregnant 10/24/2012  . Preterm labor     Past Surgical History:  Procedure Laterality Date  . CHOLECYSTECTOMY    . TONSILLECTOMY    . VAGINAL DELIVERY     X 1    Family History  Problem Relation Age of Onset  . Hypertension Mother   . Cancer Maternal Grandmother        breast  . Hypertension Maternal Uncle   . Heart attack Maternal Uncle   . Heart attack Father     Social History   Tobacco Use  . Smoking status: Current Every Day Smoker    Packs/day: 0.50    Types: Cigarettes  . Smokeless tobacco: Never Used  Substance Use Topics  . Alcohol use: No  . Drug use: No    Comment: not used heroin since Feb 2018    OB History    Gravida Para Term Preterm AB Living   4 2 1 1 1 2    SAB TAB Ectopic Multiple Live Births   1       2      Review of Systems  Constitutional: Negative.   HENT: Negative.   Eyes: Negative.   Respiratory: Negative.   Cardiovascular: Negative.   Gastrointestinal: Positive for abdominal pain.  Endocrine: Negative.   Genitourinary: Negative.   Musculoskeletal: Negative.   Skin: Negative.   Allergic/Immunologic: Negative.   Neurological: Negative.   Hematological: Negative.   Psychiatric/Behavioral: Negative.     Allergies  Patient has no known allergies.  Home Medications    BP  (!) 142/95   Pulse (!) 108   Temp 98.7 F (37.1 C)   Resp 18   Ht 4\' 9"  (1.448 m)   Wt 112 lb (50.8 kg)   LMP 07/24/2016 (Approximate)   BMI 24.24 kg/m   Physical Exam  Constitutional: She is oriented to person, place, and time. She appears well-developed and well-nourished.  HENT:  Head: Normocephalic.  Eyes: Pupils are equal, round, and reactive to light.  Neck: Normal range of motion.  Cardiovascular: Normal rate, regular rhythm, normal heart sounds and intact distal pulses.  Pulmonary/Chest: Effort normal and breath sounds normal.  Abdominal: Soft. Bowel sounds are normal.  Genitourinary: Vagina normal and uterus normal.  Musculoskeletal: Normal range of motion.  Neurological: She is alert and oriented to person, place, and time. She has normal reflexes.  Skin: Skin is warm and dry.  Psychiatric: She has a normal mood and affect. Her behavior is normal. Judgment and thought content normal.    MAU Course  Procedures (including critical care time)  Labs Reviewed  URINALYSIS, ROUTINE W REFLEX MICROSCOPIC  RAPID URINE DRUG SCREEN, HOSP PERFORMED  CBC WITH DIFFERENTIAL/PLATELET  COMPREHENSIVE METABOLIC PANEL  RPR  HIV ANTIBODY (ROUTINE TESTING)  PROTEIN /  CREATININE RATIO, URINE  ABO/RH   No results found.   No diagnosis found.    MDM  SVE ft/post/th/high. BP elevated, no edema, DTR's tr no clonus. PIH labs drawn. Dr. Vergie LivingPickens consulted. Pt to be observation for 24 hrs to evaluate BP's.

## 2017-01-08 DIAGNOSIS — Z3A32 32 weeks gestation of pregnancy: Secondary | ICD-10-CM | POA: Diagnosis not present

## 2017-01-08 DIAGNOSIS — O133 Gestational [pregnancy-induced] hypertension without significant proteinuria, third trimester: Secondary | ICD-10-CM | POA: Diagnosis not present

## 2017-01-08 LAB — COMPREHENSIVE METABOLIC PANEL
ALK PHOS: 143 U/L — AB (ref 38–126)
ALT: 11 U/L — AB (ref 14–54)
ANION GAP: 11 (ref 5–15)
AST: 22 U/L (ref 15–41)
Albumin: 3 g/dL — ABNORMAL LOW (ref 3.5–5.0)
BUN: 15 mg/dL (ref 6–20)
CALCIUM: 8.4 mg/dL — AB (ref 8.9–10.3)
CO2: 20 mmol/L — ABNORMAL LOW (ref 22–32)
CREATININE: 0.37 mg/dL — AB (ref 0.44–1.00)
Chloride: 105 mmol/L (ref 101–111)
Glucose, Bld: 85 mg/dL (ref 65–99)
Potassium: 3.5 mmol/L (ref 3.5–5.1)
Sodium: 136 mmol/L (ref 135–145)
TOTAL PROTEIN: 6.8 g/dL (ref 6.5–8.1)
Total Bilirubin: 0.4 mg/dL (ref 0.3–1.2)

## 2017-01-08 LAB — CBC
HCT: 29.8 % — ABNORMAL LOW (ref 36.0–46.0)
HEMOGLOBIN: 10.3 g/dL — AB (ref 12.0–15.0)
MCH: 31.3 pg (ref 26.0–34.0)
MCHC: 34.6 g/dL (ref 30.0–36.0)
MCV: 90.6 fL (ref 78.0–100.0)
PLATELETS: 211 10*3/uL (ref 150–400)
RBC: 3.29 MIL/uL — AB (ref 3.87–5.11)
RDW: 13.4 % (ref 11.5–15.5)
WBC: 8.4 10*3/uL (ref 4.0–10.5)

## 2017-01-08 LAB — GLUCOSE, 1 HOUR GESTATIONAL: GLUCOSE, 1 HOUR-GESTATIONAL: 214 mg/dL — AB (ref 70–189)

## 2017-01-08 LAB — HEPATITIS B SURFACE ANTIGEN: HEP B S AG: NEGATIVE

## 2017-01-08 LAB — RPR: RPR Ser Ql: NONREACTIVE

## 2017-01-08 MED ORDER — INFLUENZA VAC SPLIT QUAD 0.5 ML IM SUSY: 0.5000 mL | PREFILLED_SYRINGE | INTRAMUSCULAR | Status: DC

## 2017-01-08 NOTE — Progress Notes (Signed)
Patient has still not returned to room or been located.  Dr. Erin FullingHarraway-Smith returned call with orders to discharge patient as AMA.

## 2017-01-08 NOTE — Progress Notes (Signed)
Patient has not returned to her room.  AC and security continuing to look for patient, MD notified.

## 2017-01-08 NOTE — Progress Notes (Signed)
Patient out unit ; noted while this RN doing rounds.

## 2017-01-08 NOTE — Progress Notes (Addendum)
Daily Antepartum Note  Admission Date: 01/07/2017 Current Date: 01/08/2017 7:30 AM  Alexis Avery is a 36 y.o. [id number] @ 6748w5d by LMP=32wk u/s, HD#2, admitted for 24 hour urine and serial BPs.  Pregnancy complicated by: Patient Active Problem List   Diagnosis Date Noted  . Gestational hypertension 01/07/2017  . No prenatal care in current pregnancy 01/07/2017  . Supervision of high risk pregnancy, antepartum, third trimester 01/07/2017  . MRSA (methicillin resistant staph aureus) culture positive   . Opiate dependence (HCC) 01/21/2014  . History of gestational diabetes mellitus (GDM) in prior pregnancy, currently pregnant in third trimester 04/04/2013  . History of preterm delivery, currently pregnant 01/16/2013  . Drug use complicating pregnancy 01/16/2013  . Nicotine addiction 10/24/2012  . Anxiety 10/24/2012    Overnight/24hr events:  See RN notes  Subjective:  No s/s of pre-eclampsia, labor, decreased FM  Objective:   Vitals:   01/08/17 0238 01/08/17 0644  BP: 117/83 (!) 132/93  Pulse: 95 94  Resp: 20 20  Temp: 98.4 F (36.9 C) 98.7 F (37.1 C)  SpO2: 99% 99%     Current Vital Signs 24h Vital Sign Ranges  T 98.7 F (37.1 C) Temp  Avg: 98.5 F (36.9 C)  Min: 98.2 F (36.8 C)  Max: 98.7 F (37.1 C)  BP (!) 132/93 BP  Min: 117/83  Max: 148/88  HR 94 Pulse  Avg: 94.3  Min: 77  Max: 108  RR 20 Resp  Avg: 19.6  Min: 18  Max: 20  SaO2 99 % Not Delivered SpO2  Avg: 99.4 %  Min: 99 %  Max: 100 %       24 Hour I/O Current Shift I/O  Time Ins Outs No intake/output data recorded. No intake/output data recorded.   Patient Vitals for the past 24 hrs:  BP Temp Temp src Pulse Resp SpO2 Height Weight  01/08/17 0644 (!) 132/93 98.7 F (37.1 C) Oral 94 20 99 % - 114 lb 8 oz (51.9 kg)  01/08/17 0238 117/83 98.4 F (36.9 C) Oral 95 20 99 % - -  01/07/17 2300 120/72 98.2 F (36.8 C) Oral 77 20 99 % - -  01/07/17 2259 (!) 123/91 - - 93 - - - -  01/07/17 1900 127/81  98.6 F (37 C) Oral 89 20 100 % - -  01/07/17 1700 (!) 126/100 - - 96 - 99 % - -  01/07/17 1645 (!) 146/90 - - 99 - 100 % - -  01/07/17 1610 (!) 148/88 - - 98 - 100 % - -  01/07/17 1605 (!) 142/95 98.7 F (37.1 C) - (!) 108 18 - 4\' 9"  (1.448 m) 112 lb (50.8 kg)    Physical exam: General: Well nourished, well developed female in no acute distress. Abdomen: gravid , nttp Cardiovascular: S1, S2 normal, no murmur, rub or gallop, regular rate and rhythm Respiratory: CTAB Extremities: no clubbing, cyanosis or edema Skin: Warm and dry.   Medications: Current Facility-Administered Medications  Medication Dose Route Frequency Provider Last Rate Last Dose  . acetaminophen (TYLENOL) tablet 650 mg  650 mg Oral Q4H PRN [place] BingPickens, Jafari Mckillop, MD   650 mg at 01/08/17 0641  . calcium carbonate (TUMS - dosed in mg elemental calcium) chewable tablet 400 mg of elemental calcium  2 tablet Oral Q4H PRN [place] BingPickens, Danie Hannig, MD      . docusate sodium (COLACE) capsule 100 mg  100 mg Oral BID PRN  BingPickens, Juliann Olesky, MD      . Melene Muller[START  ON 01/09/2017] Influenza vac split quadrivalent PF (FLUARIX) injection 0.5 mL  0.5 mL Intramuscular Tomorrow-1000 [place] BingPickens, Daniele Dillow, MD      . mupirocin cream (BACTROBAN) 2 %   Topical BID [place] BingPickens, Tully Mcinturff, MD      . mupirocin ointment (BACTROBAN) 2 %   Nasal BID [place] BingPickens, Jezreel Sisk, MD      . prenatal multivitamin tablet 1 tablet  1 tablet Oral Q1200 [place] BingPickens, Taylee Gunnells, MD      . zolpidem (AMBIEN) tablet 5 mg  5 mg Oral QHS PRN [place] BingPickens, Quoc Tome, MD   5 mg at 01/07/17 2255    Labs:  Recent Labs  Lab 01/07/17 1642  WBC 9.9  HGB 10.4*  HCT 30.2*  PLT 209    Recent Labs  Lab 01/07/17 1642  NA 135  K 3.5  CL 104  CO2 19*  BUN 17  CREATININE 0.41*  CALCIUM 8.6*  PROT 6.6  BILITOT 0.6  ALKPHOS 136*  ALT 11*  AST 22  GLUCOSE 78   +THC  Radiology: no new imaging  Assessment & Plan:  Pt stable *Pregnancy: fetal status reassuring. Nob labs, 1hr GTT today. Desires BTL. Sign  papers if still inpt tomorrow or at next clinic visit. Follow up qday nst *gHTN: d/w her need for weekly testing in clinic and importance of follow up. 24h urine done approx 2100 today.  *Preterm: no current issue *MRSA: nasal bactroban ordered.  *PPx: scds, oob ad lib *FEN/GI: regular diet. Currently npo for 1hr  *Dispo: likely later today. Will need qwk testing set up at discharge.   Cornelia Copaharlie Adesuwa Osgood, Jr. MD Attending Center for [facility](Faculty Practice)

## 2017-01-08 NOTE — Progress Notes (Signed)
Security unable to find patient.  We called her cell phone and patient did not answer.  House Coverage notified and continuing to look for patient.

## 2017-01-08 NOTE — Progress Notes (Signed)
Lab in to draw second hour Glucose.  Patient off unit.  Security outside looking for her to return to floor.

## 2017-01-08 NOTE — Progress Notes (Signed)
Patient drinking Glucola.

## 2017-01-09 ENCOUNTER — Telehealth: Payer: Self-pay | Admitting: General Practice

## 2017-01-09 ENCOUNTER — Encounter: Payer: Self-pay | Admitting: Obstetrics and Gynecology

## 2017-01-09 DIAGNOSIS — O26893 Other specified pregnancy related conditions, third trimester: Secondary | ICD-10-CM

## 2017-01-09 DIAGNOSIS — Z3A32 32 weeks gestation of pregnancy: Secondary | ICD-10-CM

## 2017-01-09 DIAGNOSIS — R109 Unspecified abdominal pain: Secondary | ICD-10-CM

## 2017-01-09 LAB — CULTURE, OB URINE

## 2017-01-09 LAB — RUBELLA SCREEN: Rubella: 4.03 index (ref >0.99)

## 2017-01-09 NOTE — Telephone Encounter (Signed)
-----   Message from [place] Bingharlie Pickens, MD sent at 01/09/2017 11:14 AM EST ----- Please call her and let her know that she has GDM this pregnancy, just like in prior pregnancies and set her up for testing. Thank you

## 2017-01-09 NOTE — Telephone Encounter (Signed)
Called patient, no answer- left message to call us back concerning test results. Patient doesn't have an OB provider & needs to start care as well.

## 2017-01-10 LAB — CULTURE, BETA STREP (GROUP B ONLY)

## 2017-01-10 NOTE — L&D Delivery Note (Signed)
Operative Delivery Note At 7:29 PM a viable female was delivered via Vaginal, Vacuum Investment banker, operational(Extractor).  Presentation: ROA with loose nuchal cords; Position: Right,, Occiput,, Anterior; Station: +2.  In to room to assess patient. She had been laboring down after decel x 5 mins that resolved with position changes. On arrival, patient pushed effectively to +2 but had decel down to 80s that did not resolve in between contractions. Reviewed risks/benefits to vacuum assisted delivery including risk of cephalohematoma, possible need for c-section, risks to infant with prolonged hypoxia. Patient gave verbal consent. Kiwi suction device applied 2 cm anterior to the posterior fontanelle and equidistant across the sagittal suture and pressure increased to manufacturers recommended. With the next contraction, in conjunction with mom pushing, gentle downward traction applied for approximately 10 seconds. Suction cup released with delivery of fetal head, no popoffs occurred. One loose nuchal cord noted at delivery and delivered through. After delivery of head, shoulders and body delivered easily. Patient with significant dark red clots and bleeding on delivery of infant, likely abruption. Infant with moderate tone directly after delivery but rapidly improved. Infant placed on mother's abdomen.   APGAR: 6, ; weight 6 lb 7.5 oz (2934 g).   Placenta status: to path for small clots with appearance of abruption  Anesthesia:   Instruments: n/a Episiotomy: None Lacerations: None Est. Blood Loss (mL): 600  Mom to postpartum.  Baby to Nursery.  Alexis BowensKelly M [name] 02/07/2017, 9:15 PM

## 2017-01-12 LAB — HIV ANTIBODY (ROUTINE TESTING W REFLEX): HIV Screen 4th Generation wRfx: NONREACTIVE

## 2017-01-12 NOTE — Discharge Summary (Signed)
Pt left AMA. The nurses went to her room over a 3 hour period and she did not return.  Zacheriah Stumpe L. Harraway-Smith, M.D., [place] CoreFACOG

## 2017-01-16 ENCOUNTER — Encounter: Payer: Self-pay | Admitting: *Deleted

## 2017-01-16 NOTE — Telephone Encounter (Addendum)
Called pt and left message stating that I am calling with important test results. Please call back and leave a message stating whether you would like us to leave a detailed message with the information. Pt needs New Ob appt ASAP and also to see Diabetes Educator due to Gestational Diabetes. Certified letter mailed.

## 2017-01-19 ENCOUNTER — Telehealth: Payer: Self-pay

## 2017-01-19 NOTE — Telephone Encounter (Signed)
Patient called the office requesting test results. Pt states that it is ok to leave a detailed message.

## 2017-01-23 ENCOUNTER — Other Ambulatory Visit (HOSPITAL_COMMUNITY)
Admission: RE | Admit: 2017-01-23 | Discharge: 2017-01-23 | Disposition: A | Discharge status: Home / Self Care | Payer: Medicaid Other | Source: Ambulatory Visit | Attending: Obstetrics and Gynecology | Admitting: Obstetrics and Gynecology

## 2017-01-23 ENCOUNTER — Encounter (HOSPITAL_COMMUNITY): Payer: Self-pay | Admitting: *Deleted

## 2017-01-23 ENCOUNTER — Inpatient Hospital Stay (HOSPITAL_COMMUNITY)
Admission: AD | Admit: 2017-01-23 | Discharge: 2017-01-23 | Disposition: A | Discharge status: Home / Self Care | Payer: Medicaid Other | Source: Ambulatory Visit | Attending: Obstetrics and Gynecology | Admitting: Obstetrics and Gynecology

## 2017-01-23 ENCOUNTER — Encounter: Payer: Self-pay | Admitting: Obstetrics and Gynecology

## 2017-01-23 ENCOUNTER — Ambulatory Visit (INDEPENDENT_AMBULATORY_CARE_PROVIDER_SITE_OTHER): Payer: Medicaid Other | Admitting: Obstetrics and Gynecology

## 2017-01-23 VITALS — BP 145/110 | HR 105 | Wt 118.4 lb

## 2017-01-23 DIAGNOSIS — O0993 Supervision of high risk pregnancy, unspecified, third trimester: Secondary | ICD-10-CM | POA: Diagnosis not present

## 2017-01-23 DIAGNOSIS — O133 Gestational [pregnancy-induced] hypertension without significant proteinuria, third trimester: Secondary | ICD-10-CM | POA: Insufficient documentation

## 2017-01-23 DIAGNOSIS — Z3A34 34 weeks gestation of pregnancy: Secondary | ICD-10-CM

## 2017-01-23 DIAGNOSIS — O99333 Smoking (tobacco) complicating pregnancy, third trimester: Secondary | ICD-10-CM | POA: Insufficient documentation

## 2017-01-23 DIAGNOSIS — N898 Other specified noninflammatory disorders of vagina: Secondary | ICD-10-CM

## 2017-01-23 DIAGNOSIS — O24419 Gestational diabetes mellitus in pregnancy, unspecified control: Secondary | ICD-10-CM

## 2017-01-23 DIAGNOSIS — Z23 Encounter for immunization: Secondary | ICD-10-CM

## 2017-01-23 DIAGNOSIS — R87810 Cervical high risk human papillomavirus (HPV) DNA test positive: Secondary | ICD-10-CM | POA: Diagnosis not present

## 2017-01-23 DIAGNOSIS — F17210 Nicotine dependence, cigarettes, uncomplicated: Secondary | ICD-10-CM | POA: Diagnosis not present

## 2017-01-23 DIAGNOSIS — Z22322 Carrier or suspected carrier of Methicillin resistant Staphylococcus aureus: Secondary | ICD-10-CM

## 2017-01-23 DIAGNOSIS — Z3009 Encounter for other general counseling and advice on contraception: Secondary | ICD-10-CM | POA: Diagnosis not present

## 2017-01-23 DIAGNOSIS — O99323 Drug use complicating pregnancy, third trimester: Secondary | ICD-10-CM

## 2017-01-23 LAB — POCT URINALYSIS DIP (DEVICE)
BILIRUBIN URINE: NEGATIVE
Glucose, UA: NEGATIVE mg/dL
HGB URINE DIPSTICK: NEGATIVE
KETONES UR: NEGATIVE mg/dL
Nitrite: NEGATIVE
PH: 7 (ref 5.0–8.0)
PROTEIN: NEGATIVE mg/dL
Specific Gravity, Urine: 1.015 (ref 1.005–1.030)
Urobilinogen, UA: 0.2 mg/dL (ref 0.0–1.0)

## 2017-01-23 LAB — URINALYSIS, ROUTINE W REFLEX MICROSCOPIC
Bilirubin Urine: NEGATIVE
GLUCOSE, UA: NEGATIVE mg/dL
HGB URINE DIPSTICK: NEGATIVE
Ketones, ur: NEGATIVE mg/dL
Nitrite: NEGATIVE
PH: 7 (ref 5.0–8.0)
Protein, ur: NEGATIVE mg/dL
SPECIFIC GRAVITY, URINE: 1.008 (ref 1.005–1.030)

## 2017-01-23 LAB — COMPREHENSIVE METABOLIC PANEL
ALT: 8 U/L — ABNORMAL LOW (ref 14–54)
AST: 15 U/L (ref 15–41)
Albumin: 3.1 g/dL — ABNORMAL LOW (ref 3.5–5.0)
Alkaline Phosphatase: 175 U/L — ABNORMAL HIGH (ref 38–126)
Anion gap: 11 (ref 5–15)
BILIRUBIN TOTAL: 0.4 mg/dL (ref 0.3–1.2)
BUN: 14 mg/dL (ref 6–20)
CO2: 16 mmol/L — ABNORMAL LOW (ref 22–32)
Calcium: 8.7 mg/dL — ABNORMAL LOW (ref 8.9–10.3)
Chloride: 103 mmol/L (ref 101–111)
Creatinine, Ser: 0.42 mg/dL — ABNORMAL LOW (ref 0.44–1.00)
GFR calc non Af Amer: >60 mL/min (ref >60)
Glucose, Bld: 78 mg/dL (ref 65–99)
POTASSIUM: 3.7 mmol/L (ref 3.5–5.1)
Sodium: 130 mmol/L — ABNORMAL LOW (ref 135–145)
Total Protein: 6.7 g/dL (ref 6.5–8.1)

## 2017-01-23 LAB — CBC
HEMATOCRIT: 31.1 % — AB (ref 36.0–46.0)
Hemoglobin: 10.6 g/dL — ABNORMAL LOW (ref 12.0–15.0)
MCH: 30.5 pg (ref 26.0–34.0)
MCHC: 34.1 g/dL (ref 30.0–36.0)
MCV: 89.6 fL (ref 78.0–100.0)
Platelets: 270 10*3/uL (ref 150–400)
RBC: 3.47 MIL/uL — ABNORMAL LOW (ref 3.87–5.11)
RDW: 13.6 % (ref 11.5–15.5)
WBC: 11.2 10*3/uL — AB (ref 4.0–10.5)

## 2017-01-23 LAB — URINALYSIS, MICROSCOPIC (REFLEX)

## 2017-01-23 LAB — PROTEIN / CREATININE RATIO, URINE: Creatinine, Urine: 36 mg/dL

## 2017-01-23 LAB — OB RESULTS CONSOLE GC/CHLAMYDIA: Gonorrhea: NEGATIVE

## 2017-01-23 MED ORDER — CLOTRIMAZOLE 1 % VA CREA: 1.0000 | TOPICAL_CREAM | Freq: Every day | VAGINAL | 0 refills | Status: DC

## 2017-01-23 NOTE — MAU Provider Note (Signed)
History     CSN: [id number]  Arrival date and time: 01/23/17 1120   First Provider Initiated Contact with Patient 01/23/17 1159      Chief Complaint  Patient presents with  . Hypertension   HPI   Patient is a 334P2 female at 34w 6d presenting to the MAU for hypertension after being sent from clinic this morning. BPs in clinic today were 145/110. Patient also complains of headaches that have been occurring every other day for the past 2 months. She describes the headache pain as behind her eyes. She has taken tylenol otc with some relief. She does report "seeing white spots" twice during these headaches, but denies any other visual changes. She reports that she believed these headaches were at first due to her seasonal allergies. She also reports having swelling of her bilateral hands and feet once during the headaches that lasted roughly one day that has since subsided. She states that she "thinks it was associated with eating too much salt."    OB History    Gravida Para Term Preterm AB Living   4 2 1 1 1 2    SAB TAB Ectopic Multiple Live Births   1       2      Past Medical History:  Diagnosis Date  . Anxiety   . Depression   . Gestational diabetes mellitus, antepartum    GESTATIONAL  . 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 Left genital labial abscess 12/2015  . MRSA (methicillin resistant staph aureus) culture positive   . Nausea and vomiting in pregnancy 10/24/2012  . Nicotine addiction 10/24/2012  . Preterm labor     Past Surgical History:  Procedure Laterality Date  . CHOLECYSTECTOMY    . TONSILLECTOMY      Family History  Problem Relation Age of Onset  . Hypertension Mother   . Cancer Maternal Grandmother        breast  . Hypertension Maternal Uncle   . Heart attack Maternal Uncle   . Heart attack Father     Social History   Tobacco Use  . Smoking status: Current Every Day Smoker    Packs/day: 0.50    Types: Cigarettes  . Smokeless tobacco: Never Used  Substance  Use Topics  . Alcohol use: No  . Drug use: No    Comment: not used heroin since Feb 2018    Allergies: No Known Allergies  No medications prior to admission.    Review of Systems  Eyes: Negative for visual disturbance.  Cardiovascular: Negative for leg swelling.  Gastrointestinal: Negative for abdominal pain.  Neurological: Positive for headaches.  All other systems reviewed and are negative.  Physical Exam   Blood pressure (!) 127/93, pulse 98, temperature 99 F (37.2 C), temperature source Oral, resp. rate 18, weight 53.8 kg (118 lb 8 oz), last menstrual period 05/24/2016, SpO2 99 %.  Physical Exam  Constitutional: She is oriented to person, place, and time. She appears well-developed and well-nourished. No distress.  HENT:  Head: Normocephalic and atraumatic.  Cardiovascular: Normal rate, regular rhythm and intact distal pulses.  Neurological: She is alert and oriented to person, place, and time.   Results for orders placed or performed during the hospital encounter of 01/23/17 (from the past 24 hour(s))  Urinalysis, Routine w reflex microscopic     Status: Abnormal   Collection Time: 01/23/17 11:30 AM  Result Value Ref Range   Color, Urine YELLOW YELLOW   APPearance HAZY (A) CLEAR  Specific Gravity, Urine 1.008 1.005 - 1.030   pH 7.0 5.0 - 8.0   Glucose, UA NEGATIVE NEGATIVE mg/dL   Hgb urine dipstick NEGATIVE NEGATIVE   Bilirubin Urine NEGATIVE NEGATIVE   Ketones, ur NEGATIVE NEGATIVE mg/dL   Protein, ur NEGATIVE NEGATIVE mg/dL   Nitrite NEGATIVE NEGATIVE   Leukocytes, UA MODERATE (A) NEGATIVE  Protein / creatinine ratio, urine     Status: None   Collection Time: 01/23/17 11:30 AM  Result Value Ref Range   Creatinine, Urine 36.00 mg/dL   Total Protein, Urine <6.0 mg/dL   Protein Creatinine Ratio        0.00 - 0.15 mg/mg[Cre]  Urinalysis, Microscopic (reflex)     Status: Abnormal   Collection Time: 01/23/17 11:30 AM  Result Value Ref Range   RBC / HPF 0-5 0  - 5 RBC/hpf   WBC, UA 6-30 0 - 5 WBC/hpf   Bacteria, UA RARE (A) NONE SEEN   Squamous Epithelial / LPF 0-5 (A) NONE SEEN  CBC     Status: Abnormal   Collection Time: 01/23/17 11:57 AM  Result Value Ref Range   WBC 11.2 (H) 4.0 - 10.5 K/uL   RBC 3.47 (L) 3.87 - 5.11 MIL/uL   Hemoglobin 10.6 (L) 12.0 - 15.0 g/dL   HCT 16.1 (L) 09.6 - 04.5 %   MCV 89.6 78.0 - 100.0 fL   MCH 30.5 26.0 - 34.0 pg   MCHC 34.1 30.0 - 36.0 g/dL   RDW 40.9 81.1 - 91.4 %   Platelets 270 150 - 400 K/uL  Comprehensive metabolic panel     Status: Abnormal   Collection Time: 01/23/17 11:57 AM  Result Value Ref Range   Sodium 130 (L) 135 - 145 mmol/L   Potassium 3.7 3.5 - 5.1 mmol/L   Chloride 103 101 - 111 mmol/L   CO2 16 (L) 22 - 32 mmol/L   Glucose, Bld 78 65 - 99 mg/dL   BUN 14 6 - 20 mg/dL   Creatinine, Ser 7.82 (L) 0.44 - 1.00 mg/dL   Calcium 8.7 (L) 8.9 - 10.3 mg/dL   Total Protein 6.7 6.5 - 8.1 g/dL   Albumin 3.1 (L) 3.5 - 5.0 g/dL   AST 15 15 - 41 U/L   ALT 8 (L) 14 - 54 U/L   Alkaline Phosphatase 175 (H) 38 - 126 U/L   Total Bilirubin 0.4 0.3 - 1.2 mg/dL   GFR calc non Af Amer >60 >60 mL/min   GFR calc Af Amer >60 >60 mL/min   Anion gap 11 5 - 15     MAU Course  Procedures  MDM Continued monitoring BP for any elevations. Patient does not have severe features of preeclampsia. Platelets normal, LFTs normal, and creatinine normal.   Headache has resolved at this time.   Assessment and Plan  Patient is G4P2 at 34w 6d with Hypertension.   1. Gestational hypertension, third trimester  - Plan: Discharge patient -decrease salt intake   2. [redacted] weeks gestation of pregnancy  - Plan: Discharge patient -Continue to follow up as planned for prenatal care   Ilsa Iha PA-S2 01/23/2017, 3:31 PM

## 2017-01-23 NOTE — MAU Note (Signed)
Patient sent up from clinic for BP 145/110 and 149/110.  Pt presents with H/A but denies visual changes, swelling, and epigastric pain.

## 2017-01-23 NOTE — Progress Notes (Signed)
INITIAL PRENATAL VISIT NOTE  Subjective:  Alexis Avery is a 37 y.o. [id number] at 39w6dby LMP being seen today for her initial prenatal visit. This is an unplanned pregnancy. She and partner are happy with the pregnancy. She was using nothing for birth control. She has an obstetric history significant for 2 x SVD, induced early with 1st for oligo at 36 weeks. gDM on oral medication during second pregnancy. She has a medical history significant for none.  Patient reports vaginal itching.  Contractions: Not present. Vag. Bleeding: None.  Movement: Present. Denies leaking of fluid.   Past Medical History:  Diagnosis Date  . Anxiety   . Depression   . Gestational diabetes mellitus, antepartum    [place]  [place]   . Left genital labial abscess 12/2015  . MRSA (methicillin resistant staph aureus) culture positive   . Nausea and vomiting in pregnancy 10/24/2012  . Nicotine addiction 10/24/2012  . Preterm labor     Past Surgical History:  Procedure Laterality Date  . CHOLECYSTECTOMY    . TONSILLECTOMY      OB History  Gravida Para Term Preterm AB Living  _0 SAB TAB Ectopic Multiple Live Births  1       2    # Outcome Date GA Lbr Len/2nd Weight Sex Delivery Anes PTL Lv  4 Current           3 Term 04/18/13 312w1d5:53 / 00:23 6 lb 6 oz (2.892 kg) M Vag-Spont EPI  LIV  2 Preterm 09/04/09 3648w0d lb 7 oz (2.466 kg) F Vag-Spont EPI  LIV  1 SAB               Social History   Socioeconomic History  . Marital status: Married    Spouse name: None  . Number of children: None  . Years of education: None  . Highest education level: None  Social Needs  . Financial resource strain: None  . Food insecurity - worry: None  . Food insecurity - inability: None  . Transportation needs - medical: None  . Transportation needs - non-medical: None  Occupational History  . None  Tobacco Use  . Smoking status: Current Every Day Smoker    Packs/day: 0.50   Types: Cigarettes  . Smokeless tobacco: Never Used  Substance and Sexual Activity  . Alcohol use: No  . Drug use: No    Comment: not used heroin since Feb 2018  . Sexual activity: Yes    Birth control/protection: None  Other Topics Concern  . None  Social History Narrative  . None    Family History  Problem Relation Age of Onset  . Hypertension Mother   . Cancer Maternal Grandmother        breast  . Hypertension Maternal Uncle   . Heart attack Maternal Uncle   . Heart attack Father     (Not in a hospital admission)  No Known Allergies  Review of Systems: Negative except for what is mentioned in HPI.  Objective:   Vitals:   01/23/17 1019  BP: (!) 145/110  Pulse: (!) 105  Weight: 118 lb 6.4 oz (53.7 kg)   Fetal Status: Fetal Heart Rate (bpm): 146   Movement: Present     Physical Exam: BP (!) 145/110   Pulse (!) 105   Wt 118 lb 6.4 oz (53.7 kg)   LMP 05/24/2016   BMI 25.62 kg/m  CONSTITUTIONAL:  Well-developed, well-nourished female in no acute distress.  NEUROLOGIC: Alert and oriented to person, place, and time. Normal reflexes, muscle tone coordination. No cranial nerve deficit noted. PSYCHIATRIC: Normal mood and affect. Normal behavior. Normal judgment and thought content. SKIN: Skin is warm and dry. No rash noted. Not diaphoretic. No erythema. No pallor. HENT:  Normocephalic, atraumatic, External right and left ear normal. Oropharynx is clear and moist EYES: Conjunctivae and EOM are normal. Pupils are equal, round, and reactive to light. No scleral icterus.  NECK: Normal range of motion, supple, no masses CARDIOVASCULAR: Normal heart rate noted, regular rhythm RESPIRATORY: Effort and breath sounds normal, no problems with respiration noted BREASTS: deferred ABDOMEN: Soft, nontender, nondistended, gravid., measuring 30 cm GU: normal appearing external female genitalia, multiparous normal appearing cervix, thick white discharge in vagina, no lesions  noted MUSCULOSKELETAL: Normal range of motion. EXT:  No edema and no tenderness. 2+ distal pulses.  Assessment and Plan:  Pregnancy: [id number] at 65w6dby LMP  1. Supervision of high risk pregnancy, antepartum, third trimester - Tdap vaccine greater than or equal to 7yo IM - Flu Vaccine QUAD 36+ mos IM - Enroll Patient in Babyscripts  2. Gestational diabetes mellitus (GDM) in third trimester, gestational diabetes method of control unspecified Has not met with nutrition yet appt made today  3. Cervical high risk human papillomavirus (HPV) DNA test positive Needs repeat pap post partum  4. 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 MRSA (methicillin resistant staph aureus) culture positive   6. Gestational hypertension, third trimester Was to be obs in hospital x 24 hrs, left AMA  7. Vaginal discharge Wet prep sent Clotrimazole sent to pharamcy  8. Unwanted fertility - signed BTL papers today - not 1867%certain but wants to sign in case she decides  To MAU for rule out PEC for severe range diastolic  Preterm labor symptoms and general obstetric precautions including but not limited to vaginal bleeding, contractions, leaking of fluid and fetal movement were reviewed in detail with the patient.  Please refer to After Visit Summary for other counseling recommendations.   Return in about 1 week (around 01/30/2017) for OB visit.  KSloan Leiter1/14/2019 11:10 AM

## 2017-01-23 NOTE — Discharge Instructions (Signed)
Hypertension During Pregnancy °Hypertension, commonly called high blood pressure, is when the force of blood pumping through your arteries is too strong. Arteries are blood vessels that carry blood from the heart throughout the body. Hypertension during pregnancy can cause problems for you and your baby. Your baby may be born early (prematurely) or may not weigh as much as he or she should at birth. Very bad cases of hypertension during pregnancy can be life-threatening. °Different types of hypertension can occur during pregnancy. These include: °· Chronic hypertension. This happens when: °? You have hypertension before pregnancy and it continues during pregnancy. °? You develop hypertension before you are [redacted] weeks pregnant, and it continues during pregnancy. °· Gestational hypertension. This is hypertension that develops after the 20th week of pregnancy. °· Preeclampsia, also called toxemia of pregnancy. This is a very serious type of hypertension that develops only during pregnancy. It affects the whole body, and it can be very dangerous for you and your baby. ° °Gestational hypertension and preeclampsia usually go away within 6 weeks after your baby is born. Women who have hypertension during pregnancy have a greater chance of developing hypertension later in life or during future pregnancies. °What are the causes? °The exact cause of hypertension is not known. °What increases the risk? °There are certain factors that make it more likely for you to develop hypertension during pregnancy. These include: °· Having hypertension during a previous pregnancy or prior to pregnancy. °· Being overweight. °· Being older than age 40. °· Being pregnant for the first time or being pregnant with more than one baby. °· Becoming pregnant using fertilization methods such as IVF (in vitro fertilization). °· Having diabetes, kidney problems, or systemic lupus erythematosus. °· Having a family history of hypertension. ° °What are the  signs or symptoms? °Chronic hypertension and gestational hypertension rarely cause symptoms. Preeclampsia causes symptoms, which may include: °· Increased protein in your urine. Your health care provider will check for this at every visit before you give birth (prenatal visit). °· Severe headaches. °· Sudden weight gain. °· Swelling of the hands, face, legs, and feet. °· Nausea and vomiting. °· Vision problems, such as blurred or double vision. °· Numbness in the face, arms, legs, and feet. °· Dizziness. °· Slurred speech. °· Sensitivity to bright lights. °· Abdominal pain. °· Convulsions. ° °How is this diagnosed? °You may be diagnosed with hypertension during a routine prenatal exam. At each prenatal visit, you may: °· Have a urine test to check for high amounts of protein in your urine. °· Have your blood pressure checked. A blood pressure reading is recorded as two numbers, such as "120 over 80" (or 120/80). The first ("top") number is called the systolic pressure. It is a measure of the pressure in your arteries when your heart beats. The second ("bottom") number is called the diastolic pressure. It is a measure of the pressure in your arteries as your heart relaxes between beats. Blood pressure is measured in a unit called mm Hg. A normal blood pressure reading is: °? Systolic: below 120. °? Diastolic: below 80. ° °The type of hypertension that you are diagnosed with depends on your test results and when your symptoms developed. °· Chronic hypertension is usually diagnosed before 20 weeks of pregnancy. °· Gestational hypertension is usually diagnosed after 20 weeks of pregnancy. °· Hypertension with high amounts of protein in the urine is diagnosed as preeclampsia. °· Blood pressure measurements that stay above 160 systolic, or above 110 diastolic, are   signs of severe preeclampsia. ° °How is this treated? °Treatment for hypertension during pregnancy varies depending on the type of hypertension you have and how  serious it is. °· If you take medicines called ACE inhibitors to treat chronic hypertension, you may need to switch medicines. ACE inhibitors should not be taken during pregnancy. °· If you have gestational hypertension, you may need to take blood pressure medicine. °· If you are at risk for preeclampsia, your health care provider may recommend that you take a low-dose aspirin every day to prevent high blood pressure during your pregnancy. °· If you have severe preeclampsia, you may need to be hospitalized so you and your baby can be monitored closely. You may also need to take medicine (magnesium sulfate) to prevent seizures and to lower blood pressure. This medicine may be given as an injection or through an IV tube. °· In some cases, if your condition gets worse, you may need to deliver your baby early. ° °Follow these instructions at home: °Eating and drinking °· Drink enough fluid to keep your urine clear or pale yellow. °· Eat a healthy diet that is low in salt (sodium). Do not add salt to your food. Check food labels to see how much sodium a food or beverage contains. °Lifestyle °· Do not use any products that contain nicotine or tobacco, such as cigarettes and e-cigarettes. If you need help quitting, ask your health care provider. °· Do not use alcohol. °· Avoid caffeine. °· Avoid stress as much as possible. Rest and get plenty of sleep. °General instructions °· Take over-the-counter and prescription medicines only as told by your health care provider. °· While lying down, lie on your left side. This keeps pressure off your baby. °· While sitting or lying down, raise (elevate) your feet. Try putting some pillows under your lower legs. °· Exercise regularly. Ask your health care provider what kinds of exercise are best for you. °· Keep all prenatal and follow-up visits as told by your health care provider. This is important. °Contact a health care provider if: °· You have symptoms that your health care  provider told you may require more treatment or monitoring, such as: °? Fever. °? Vomiting. °? Headache. °Get help right away if: °· You have severe abdominal pain or vomiting that does not get better with treatment. °· You suddenly develop swelling in your hands, ankles, or face. °· You gain 4 lbs (1.8 kg) or more in 1 week. °· You develop vaginal bleeding, or you have blood in your urine. °· You do not feel your baby moving as much as usual. °· You have blurred or double vision. °· You have muscle twitching or sudden tightening (spasms). °· You have shortness of breath. °· Your lips or fingernails turn blue. °This information is not intended to replace advice given to you by your health care provider. Make sure you discuss any questions you have with your health care provider. °Document Released: 09/14/2010 Document Revised: 07/17/2015 Document Reviewed: 06/12/2015 °Elsevier Interactive Patient Education © 2018 [employer] ° °

## 2017-01-24 ENCOUNTER — Encounter: Payer: Self-pay | Admitting: *Deleted

## 2017-01-24 LAB — CERVICOVAGINAL ANCILLARY ONLY
BACTERIAL VAGINITIS: NEGATIVE
Candida vaginitis: POSITIVE — AB
Chlamydia: NEGATIVE
NEISSERIA GONORRHEA: NEGATIVE
Trichomonas: POSITIVE — AB

## 2017-01-24 NOTE — MAU Provider Note (Signed)
History     CSN: [id number]  Arrival date and time: 01/23/17 1120   First Provider Initiated Contact with Patient 01/23/17 1159      Chief Complaint  Patient presents with  . Hypertension   HPI  Alexis Avery is a 37 y.o. [id number] at [redacted]w[redacted]d who presents from the office for hypertension evaluation. GHTN & GDM with current pregnancy. Patient reports daily headaches for the last month. Currently no headache; had headache this morning that resolved without taking medication. Denies visual disturbance or epigastric pain.   OB History    Gravida Para Term Preterm AB Living   4 2 1 1 1 2    SAB TAB Ectopic Multiple Live Births   1       2      Past Medical History:  Diagnosis Date  . Anxiety   . Depression   . Gestational diabetes mellitus, antepartum    GESTATIONAL  . 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 Left genital labial abscess 12/2015  . MRSA (methicillin resistant staph aureus) culture positive   . Nausea and vomiting in pregnancy 10/24/2012  . Nicotine addiction 10/24/2012  . Preterm labor     Past Surgical History:  Procedure Laterality Date  . CHOLECYSTECTOMY    . TONSILLECTOMY      Family History  Problem Relation Age of Onset  . Hypertension Mother   . Cancer Maternal Grandmother        breast  . Hypertension Maternal Uncle   . Heart attack Maternal Uncle   . Heart attack Father     Social History   Tobacco Use  . Smoking status: Current Every Day Smoker    Packs/day: 0.50    Types: Cigarettes  . Smokeless tobacco: Never Used  Substance Use Topics  . Alcohol use: No  . Drug use: No    Comment: not used heroin since Feb 2018    Allergies: No Known Allergies  No medications prior to admission.    Review of Systems  Constitutional: Negative.   Eyes: Negative for visual disturbance.  Gastrointestinal: Negative.   Genitourinary: Negative.   Neurological: Positive for headaches (daily, not currently).   Physical Exam   Blood pressure (!) 127/93, pulse  98, temperature 99 F (37.2 C), temperature source Oral, resp. rate 18, weight 118 lb 8 oz (53.8 kg), last menstrual period 05/24/2016, SpO2 99 %. Patient Vitals for the past 24 hrs:  BP Temp Temp src Pulse Resp SpO2 Weight  01/23/17 1345 (!) 127/93 - - 98 18 99 % -  01/23/17 1330 (!) 132/92 - - 89 - 94 % -  01/23/17 1315 (!) 132/97 - - 93 - 98 % -  01/23/17 1245 (!) 129/91 - - (!) 102 - 98 % -  01/23/17 1230 (!) 137/92 - - 90 - 98 % -  01/23/17 1215 (!) 124/97 - - 96 - 98 % -  01/23/17 1200 (!) 146/95 - - (!) 102 - 97 % -  01/23/17 1146 (!) 132/96 - - (!) 116 - 97 % -  01/23/17 1139 (!) 132/106 99 F (37.2 C) Oral (!) 116 20 99 % -  01/23/17 1133 - - - - - - 118 lb 8 oz (53.8 kg)    Physical Exam  Nursing note and vitals reviewed. Constitutional: She is oriented to person, place, and time. She appears well-developed and well-nourished. No distress.  HENT:  Head: Normocephalic and atraumatic.  Eyes: Conjunctivae are normal. Right eye exhibits no discharge. Left eye  exhibits no discharge. No scleral icterus.  Neck: Normal range of motion.  Cardiovascular: Normal rate, regular rhythm and normal heart sounds.  No murmur heard. Respiratory: Effort normal and breath sounds normal. No respiratory distress. She has no wheezes.  GI: Soft. There is no tenderness.  Neurological: She is alert and oriented to person, place, and time. She has normal reflexes.  No clonus  Skin: Skin is warm and dry. She is not diaphoretic.  Psychiatric: She has a normal mood and affect. Her behavior is normal. Judgment and thought content normal.    MAU Course  Procedures Results for orders placed or performed during the hospital encounter of 01/23/17 (from the past 24 hour(s))  Urinalysis, Routine w reflex microscopic     Status: Abnormal   Collection Time: 01/23/17 11:30 AM  Result Value Ref Range   Color, Urine YELLOW YELLOW   APPearance HAZY (A) CLEAR   Specific Gravity, Urine 1.008 1.005 - 1.030    pH 7.0 5.0 - 8.0   Glucose, UA NEGATIVE NEGATIVE mg/dL   Hgb urine dipstick NEGATIVE NEGATIVE   Bilirubin Urine NEGATIVE NEGATIVE   Ketones, ur NEGATIVE NEGATIVE mg/dL   Protein, ur NEGATIVE NEGATIVE mg/dL   Nitrite NEGATIVE NEGATIVE   Leukocytes, UA MODERATE (A) NEGATIVE  Protein / creatinine ratio, urine     Status: None   Collection Time: 01/23/17 11:30 AM  Result Value Ref Range   Creatinine, Urine 36.00 mg/dL   Total Protein, Urine <6.0 mg/dL   Protein Creatinine Ratio        0.00 - 0.15 mg/mg[Cre]  Urinalysis, Microscopic (reflex)     Status: Abnormal   Collection Time: 01/23/17 11:30 AM  Result Value Ref Range   RBC / HPF 0-5 0 - 5 RBC/hpf   WBC, UA 6-30 0 - 5 WBC/hpf   Bacteria, UA RARE (A) NONE SEEN   Squamous Epithelial / LPF 0-5 (A) NONE SEEN  CBC     Status: Abnormal   Collection Time: 01/23/17 11:57 AM  Result Value Ref Range   WBC 11.2 (H) 4.0 - 10.5 K/uL   RBC 3.47 (L) 3.87 - 5.11 MIL/uL   Hemoglobin 10.6 (L) 12.0 - 15.0 g/dL   HCT 16.131.1 (L) 09.636.0 - 04.546.0 %   MCV 89.6 78.0 - 100.0 fL   MCH 30.5 26.0 - 34.0 pg   MCHC 34.1 30.0 - 36.0 g/dL   RDW 40.913.6 81.111.5 - 91.415.5 %   Platelets 270 150 - 400 K/uL  Comprehensive metabolic panel     Status: Abnormal   Collection Time: 01/23/17 11:57 AM  Result Value Ref Range   Sodium 130 (L) 135 - 145 mmol/L   Potassium 3.7 3.5 - 5.1 mmol/L   Chloride 103 101 - 111 mmol/L   CO2 16 (L) 22 - 32 mmol/L   Glucose, Bld 78 65 - 99 mg/dL   BUN 14 6 - 20 mg/dL   Creatinine, Ser 7.820.42 (L) 0.44 - 1.00 mg/dL   Calcium 8.7 (L) 8.9 - 10.3 mg/dL   Total Protein 6.7 6.5 - 8.1 g/dL   Albumin 3.1 (L) 3.5 - 5.0 g/dL   AST 15 15 - 41 U/L   ALT 8 (L) 14 - 54 U/L   Alkaline Phosphatase 175 (H) 38 - 126 U/L   Total Bilirubin 0.4 0.3 - 1.2 mg/dL   GFR calc non Af Amer >60 >60 mL/min   GFR calc Af Amer >60 >60 mL/min   Anion gap 11 5 - 15  MDM NST:  Baseline: 135 bpm, Variability: Good {> 6 bpm), Accelerations: Reactive and Decelerations:  Absent Elevated BPs in MAU, none severe range & patient asymptomatic CBC, CMP, urine PCR< no proteinuria & labs stable Reviewed patient with Dr. Shawnie Pons. OK to discharge home.  Patient will have close follow up in the office.  Assessment and Plan  A: 1. Gestational hypertension, third trimester   2. [redacted] weeks gestation of pregnancy    P: Discharge home Scheduled patient for BPP/NST & BP check this Thursday. Next OB appointment in 1 week Discussed reasons to return to MAU including s/s preeclampsia Stressed importance of keeping scheduled appointments  Alexis Avery 01/24/2017, 8:50 AM

## 2017-01-25 ENCOUNTER — Encounter: Payer: Self-pay | Admitting: *Deleted

## 2017-01-25 ENCOUNTER — Other Ambulatory Visit: Payer: Self-pay | Admitting: Obstetrics and Gynecology

## 2017-01-25 MED ORDER — METRONIDAZOLE 500 MG PO TABS: 500.0000 mg | ORAL_TABLET | Freq: Two times a day (BID) | ORAL | 0 refills | Status: DC

## 2017-01-25 NOTE — Progress Notes (Unsigned)
Sent script to pharmacy for trichomonas infection.

## 2017-01-26 ENCOUNTER — Other Ambulatory Visit: Payer: Self-pay

## 2017-01-26 ENCOUNTER — Telehealth: Payer: Self-pay | Admitting: *Deleted

## 2017-01-26 NOTE — Telephone Encounter (Signed)
Called pt regarding missed appt for today.  Message left stating that she missed an appt toady @ 1415. The appt is very important for her care and care of the baby. She may still come today provided she can arrive by 1515. We have appts available for tomorrow if she is not able to make it today. She may call back to reschedule.

## 2017-01-27 ENCOUNTER — Telehealth: Payer: Self-pay | Admitting: Obstetrics and Gynecology

## 2017-01-27 NOTE — Telephone Encounter (Signed)
Called patient to see if she would be willing to come in sooner. I was not able to reach her, and I left a voicemail for her to call the office on Monday 01/30/2017 after we open at 8:00 am.

## 2017-01-31 ENCOUNTER — Encounter: Payer: Self-pay | Admitting: Family Medicine

## 2017-02-01 NOTE — Telephone Encounter (Signed)
Called patient, no answer- left message stating we are trying to reach you regarding several important test results we need to discuss with you. We will review all this at your scheduled appt tomorrow and I would anticipate being here several hours. You may call us back today you'd like. Per chart review, patient has GDM, trich & yeast. Note made in appt

## 2017-02-02 ENCOUNTER — Other Ambulatory Visit: Payer: Medicaid Other

## 2017-02-02 ENCOUNTER — Ambulatory Visit: Payer: Self-pay

## 2017-02-02 ENCOUNTER — Ambulatory Visit (INDEPENDENT_AMBULATORY_CARE_PROVIDER_SITE_OTHER): Payer: Medicaid Other | Admitting: Family Medicine

## 2017-02-02 ENCOUNTER — Ambulatory Visit (INDEPENDENT_AMBULATORY_CARE_PROVIDER_SITE_OTHER): Payer: Medicaid Other | Admitting: *Deleted

## 2017-02-02 VITALS — BP 130/98 | HR 111 | Wt 118.5 lb

## 2017-02-02 DIAGNOSIS — O133 Gestational [pregnancy-induced] hypertension without significant proteinuria, third trimester: Secondary | ICD-10-CM

## 2017-02-02 DIAGNOSIS — O24419 Gestational diabetes mellitus in pregnancy, unspecified control: Secondary | ICD-10-CM

## 2017-02-02 DIAGNOSIS — O0993 Supervision of high risk pregnancy, unspecified, third trimester: Secondary | ICD-10-CM | POA: Diagnosis present

## 2017-02-02 DIAGNOSIS — O09899 Supervision of other high risk pregnancies, unspecified trimester: Secondary | ICD-10-CM

## 2017-02-02 DIAGNOSIS — O09219 Supervision of pregnancy with history of pre-term labor, unspecified trimester: Secondary | ICD-10-CM | POA: Diagnosis not present

## 2017-02-02 DIAGNOSIS — R87810 Cervical high risk human papillomavirus (HPV) DNA test positive: Secondary | ICD-10-CM | POA: Diagnosis not present

## 2017-02-02 DIAGNOSIS — F1120 Opioid dependence, uncomplicated: Secondary | ICD-10-CM

## 2017-02-02 LAB — POCT URINALYSIS DIP (DEVICE)
Bilirubin Urine: NEGATIVE
Glucose, UA: NEGATIVE mg/dL
Hgb urine dipstick: NEGATIVE
Ketones, ur: NEGATIVE mg/dL
Leukocytes, UA: NEGATIVE
Nitrite: NEGATIVE
Protein, ur: NEGATIVE mg/dL
Specific Gravity, Urine: 1.02 (ref 1.005–1.030)
Urobilinogen, UA: 0.2 mg/dL (ref 0.0–1.0)
pH: 7 (ref 5.0–8.0)

## 2017-02-02 LAB — OB RESULTS CONSOLE GBS: GBS: NEGATIVE

## 2017-02-02 NOTE — Progress Notes (Signed)
   PRENATAL VISIT NOTE  Subjective:  Early Osmondlisha Pernell is a 37 y.o. [id number] at 65109w2d being seen today for ongoing prenatal care.  She is currently monitored for the following issues for this high-risk pregnancy and has Nicotine addiction; Anxiety; History of preterm delivery, currently pregnant; Drug use complicating pregnancy; GDM (gestational diabetes mellitus); Opiate dependence (HCC); Gestational hypertension; MRSA (methicillin resistant staph aureus) culture positive; No prenatal care in current pregnancy; Supervision of high risk pregnancy, antepartum, third trimester; Abdominal pain in pregnancy, third trimester; Pregnancy with 32 completed weeks gestation; and Cervical high risk human papillomavirus (HPV) DNA test positive on their problem list.  Patient reports no complaints.  Contractions: Not present. Vag. Bleeding: None.  Movement: Present. Denies leaking of fluid.   The following portions of the patient's history were reviewed and updated as appropriate: allergies, current medications, past family history, past medical history, past social history, past surgical history and problem list. Problem list updated.  Objective:   Vitals:   02/02/17 1335  BP: (!) 130/98  Pulse: (!) 111  Weight: 118 lb 8 oz (53.8 kg)    Fetal Status: Fetal Heart Rate (bpm): NST   Movement: Present     General:  Alert, oriented and cooperative. Patient is in no acute distress.  Skin: Skin is warm and dry. No rash noted.   Cardiovascular: Normal heart rate noted  Respiratory: Normal respiratory effort, no problems with respiration noted  Abdomen: Soft, gravid, appropriate for gestational age.  Pain/Pressure: Present     Pelvic: Cervical exam deferred        Extremities: Normal range of motion.  Edema: None  Mental Status:  Normal mood and affect. Normal behavior. Normal judgment and thought content.   Assessment and Plan:  Pregnancy: [id number] at 57109w2d  1. Supervision of high risk pregnancy,  antepartum, third trimester FHT and Fh normal. GBS today.  2. Gestational diabetes mellitus (GDM) in third trimester, gestational diabetes method of control unspecified Not testing, but will be induced in 5 days.  3. Gestational hypertension, third trimester Induce at 37 weeks. BPP 10/10 NST reactive.  4. History of preterm delivery, currently pregnant  5. Uncomplicated opioid dependence (HCC)  6. Cervical high risk human papillomavirus (HPV) DNA test positive PAP postpartum  Preterm labor symptoms and general obstetric precautions including but not limited to vaginal bleeding, contractions, leaking of fluid and fetal movement were reviewed in detail with the patient. Please refer to After Visit Summary for other counseling recommendations.  No Follow-up on file.   Levie HeritageJacob J Kairi Tufo, DO

## 2017-02-02 NOTE — Progress Notes (Signed)
Pt informed of +yeast and +trich from tests performed on 1/14. She stated that she is taking the Metronidazole but was not given the vaginal cream from the pharmacy.  Pt advised that her partner will need treatment for +trich.  IOL scheduled on 1/29 @ 0700.

## 2017-02-02 NOTE — Addendum Note (Signed)
Addended by: Gerome ApleyZEYFANG, LINDA L on: 02/02/2017 03:20 PM   Modules accepted: Orders

## 2017-02-02 NOTE — Progress Notes (Signed)

## 2017-02-06 ENCOUNTER — Telehealth (HOSPITAL_COMMUNITY): Payer: Self-pay | Admitting: *Deleted

## 2017-02-06 LAB — CULTURE, BETA STREP (GROUP B ONLY): STREP GP B CULTURE: NEGATIVE

## 2017-02-06 NOTE — Telephone Encounter (Signed)
Preadmission screen  

## 2017-02-07 ENCOUNTER — Encounter (HOSPITAL_COMMUNITY): Payer: Self-pay

## 2017-02-07 ENCOUNTER — Inpatient Hospital Stay (HOSPITAL_COMMUNITY): Payer: Medicaid Other | Admitting: Anesthesiology

## 2017-02-07 ENCOUNTER — Inpatient Hospital Stay (HOSPITAL_COMMUNITY)
Admission: RE | Admit: 2017-02-07 | Discharge: 2017-02-09 | DRG: 807 | Disposition: A | Discharge status: Home / Self Care | Payer: Medicaid Other | Source: Ambulatory Visit | Attending: Obstetrics and Gynecology | Admitting: Obstetrics and Gynecology

## 2017-02-07 DIAGNOSIS — O99320 Drug use complicating pregnancy, unspecified trimester: Secondary | ICD-10-CM | POA: Diagnosis present

## 2017-02-07 DIAGNOSIS — O99344 Other mental disorders complicating childbirth: Secondary | ICD-10-CM | POA: Diagnosis present

## 2017-02-07 DIAGNOSIS — O9962 Diseases of the digestive system complicating childbirth: Secondary | ICD-10-CM | POA: Diagnosis present

## 2017-02-07 DIAGNOSIS — O24429 Gestational diabetes mellitus in childbirth, unspecified control: Secondary | ICD-10-CM

## 2017-02-07 DIAGNOSIS — O99334 Smoking (tobacco) complicating childbirth: Secondary | ICD-10-CM | POA: Diagnosis present

## 2017-02-07 DIAGNOSIS — F329 Major depressive disorder, single episode, unspecified: Secondary | ICD-10-CM | POA: Diagnosis present

## 2017-02-07 DIAGNOSIS — O139 Gestational [pregnancy-induced] hypertension without significant proteinuria, unspecified trimester: Secondary | ICD-10-CM | POA: Diagnosis present

## 2017-02-07 DIAGNOSIS — O24420 Gestational diabetes mellitus in childbirth, diet controlled: Secondary | ICD-10-CM | POA: Diagnosis present

## 2017-02-07 DIAGNOSIS — F17210 Nicotine dependence, cigarettes, uncomplicated: Secondary | ICD-10-CM | POA: Diagnosis present

## 2017-02-07 DIAGNOSIS — F1290 Cannabis use, unspecified, uncomplicated: Secondary | ICD-10-CM | POA: Diagnosis present

## 2017-02-07 DIAGNOSIS — Z3A37 37 weeks gestation of pregnancy: Secondary | ICD-10-CM

## 2017-02-07 DIAGNOSIS — O4593 Premature separation of placenta, unspecified, third trimester: Secondary | ICD-10-CM | POA: Diagnosis present

## 2017-02-07 DIAGNOSIS — O09219 Supervision of pregnancy with history of pre-term labor, unspecified trimester: Secondary | ICD-10-CM

## 2017-02-07 DIAGNOSIS — O99324 Drug use complicating childbirth: Secondary | ICD-10-CM | POA: Diagnosis present

## 2017-02-07 DIAGNOSIS — O09899 Supervision of other high risk pregnancies, unspecified trimester: Secondary | ICD-10-CM

## 2017-02-07 DIAGNOSIS — K219 Gastro-esophageal reflux disease without esophagitis: Secondary | ICD-10-CM | POA: Diagnosis present

## 2017-02-07 DIAGNOSIS — O134 Gestational [pregnancy-induced] hypertension without significant proteinuria, complicating childbirth: Secondary | ICD-10-CM | POA: Diagnosis present

## 2017-02-07 DIAGNOSIS — O0993 Supervision of high risk pregnancy, unspecified, third trimester: Secondary | ICD-10-CM

## 2017-02-07 DIAGNOSIS — O24419 Gestational diabetes mellitus in pregnancy, unspecified control: Secondary | ICD-10-CM | POA: Diagnosis present

## 2017-02-07 DIAGNOSIS — Z3490 Encounter for supervision of normal pregnancy, unspecified, unspecified trimester: Secondary | ICD-10-CM | POA: Diagnosis present

## 2017-02-07 DIAGNOSIS — Z8614 Personal history of Methicillin resistant Staphylococcus aureus infection: Secondary | ICD-10-CM | POA: Diagnosis not present

## 2017-02-07 DIAGNOSIS — R87810 Cervical high risk human papillomavirus (HPV) DNA test positive: Secondary | ICD-10-CM | POA: Diagnosis present

## 2017-02-07 LAB — MRSA PCR SCREENING: MRSA by PCR: INVALID — AB

## 2017-02-07 LAB — RPR: RPR Ser Ql: NONREACTIVE

## 2017-02-07 LAB — CBC
HCT: 35.2 % — ABNORMAL LOW (ref 36.0–46.0)
HEMOGLOBIN: 11.9 g/dL — AB (ref 12.0–15.0)
MCH: 30.4 pg (ref 26.0–34.0)
MCHC: 33.8 g/dL (ref 30.0–36.0)
MCV: 89.8 fL (ref 78.0–100.0)
PLATELETS: 227 10*3/uL (ref 150–400)
RBC: 3.92 MIL/uL (ref 3.87–5.11)
RDW: 14 % (ref 11.5–15.5)
WBC: 9.5 10*3/uL (ref 4.0–10.5)

## 2017-02-07 LAB — PROTEIN / CREATININE RATIO, URINE
Creatinine, Urine: 51 mg/dL
Protein Creatinine Ratio: 0.16 mg/mg{creat} — ABNORMAL HIGH (ref 0.00–0.15)
Total Protein, Urine: 8 mg/dL

## 2017-02-07 LAB — COMPREHENSIVE METABOLIC PANEL WITH GFR
ALT: 8 U/L — ABNORMAL LOW (ref 14–54)
AST: 16 U/L (ref 15–41)
Albumin: 2.9 g/dL — ABNORMAL LOW (ref 3.5–5.0)
Alkaline Phosphatase: 228 U/L — ABNORMAL HIGH (ref 38–126)
Anion gap: 9 (ref 5–15)
BUN: 15 mg/dL (ref 6–20)
CO2: 18 mmol/L — ABNORMAL LOW (ref 22–32)
Calcium: 8.5 mg/dL — ABNORMAL LOW (ref 8.9–10.3)
Chloride: 106 mmol/L (ref 101–111)
Creatinine, Ser: 0.41 mg/dL — ABNORMAL LOW (ref 0.44–1.00)
GFR calc Af Amer: >60 mL/min
GFR calc non Af Amer: >60 mL/min
Glucose, Bld: 104 mg/dL — ABNORMAL HIGH (ref 65–99)
Potassium: 3.6 mmol/L (ref 3.5–5.1)
Sodium: 133 mmol/L — ABNORMAL LOW (ref 135–145)
Total Bilirubin: 0.2 mg/dL — ABNORMAL LOW (ref 0.3–1.2)
Total Protein: 5.6 g/dL — ABNORMAL LOW (ref 6.5–8.1)

## 2017-02-07 LAB — TYPE AND SCREEN
ABO/RH(D): O POS
ANTIBODY SCREEN: NEGATIVE

## 2017-02-07 LAB — RAPID URINE DRUG SCREEN, HOSP PERFORMED
Amphetamines: NOT DETECTED
BARBITURATES: NOT DETECTED
BENZODIAZEPINES: NOT DETECTED
COCAINE: NOT DETECTED
Opiates: NOT DETECTED
Tetrahydrocannabinol: NOT DETECTED

## 2017-02-07 MED ORDER — DIBUCAINE 1 % RE OINT: 1.0000 "application " | TOPICAL_OINTMENT | RECTAL | Status: DC | PRN

## 2017-02-07 MED ORDER — EPHEDRINE 5 MG/ML INJ
10.0000 mg | INTRAVENOUS | Status: DC | PRN
  Filled 2017-02-07: qty 2

## 2017-02-07 MED ORDER — ACETAMINOPHEN 325 MG PO TABS: 650.0000 mg | ORAL_TABLET | ORAL | Status: DC | PRN

## 2017-02-07 MED ORDER — LIDOCAINE HCL (PF) 1 % IJ SOLN
INTRAMUSCULAR | Status: DC | PRN
  Administered 2017-02-07: 3 mL via EPIDURAL
  Administered 2017-02-07: 4 mL via EPIDURAL

## 2017-02-07 MED ORDER — ONDANSETRON HCL 4 MG PO TABS: 4.0000 mg | ORAL_TABLET | ORAL | Status: DC | PRN

## 2017-02-07 MED ORDER — MUPIROCIN 2 % EX OINT: 1.0000 "application " | TOPICAL_OINTMENT | Freq: Two times a day (BID) | CUTANEOUS | Status: DC

## 2017-02-07 MED ORDER — ONDANSETRON HCL 4 MG/2ML IJ SOLN: 4.0000 mg | INTRAMUSCULAR | Status: DC | PRN

## 2017-02-07 MED ORDER — SENNOSIDES-DOCUSATE SODIUM 8.6-50 MG PO TABS
2.0000 | ORAL_TABLET | ORAL | Status: DC
  Administered 2017-02-07 – 2017-02-08 (×2): 2 via ORAL
  Filled 2017-02-07 (×2): qty 2

## 2017-02-07 MED ORDER — MISOPROSTOL 25 MCG QUARTER TABLET: 25.0000 ug | ORAL_TABLET | ORAL | Status: DC | PRN

## 2017-02-07 MED ORDER — OXYTOCIN BOLUS FROM INFUSION
500.0000 mL | Freq: Once | INTRAVENOUS | Status: AC
  Administered 2017-02-07: 500 mL via INTRAVENOUS

## 2017-02-07 MED ORDER — LIDOCAINE HCL (PF) 1 % IJ SOLN
30.0000 mL | INTRAMUSCULAR | Status: DC | PRN
  Filled 2017-02-07: qty 30

## 2017-02-07 MED ORDER — BENZOCAINE-MENTHOL 20-0.5 % EX AERO: 1.0000 "application " | INHALATION_SPRAY | CUTANEOUS | Status: DC | PRN

## 2017-02-07 MED ORDER — DIPHENHYDRAMINE HCL 50 MG/ML IJ SOLN
12.5000 mg | INTRAMUSCULAR | Status: DC | PRN
Start: 2017-02-07 — End: 2017-02-07

## 2017-02-07 MED ORDER — FENTANYL 2.5 MCG/ML BUPIVACAINE 1/10 % EPIDURAL INFUSION (WH - ANES)
14.0000 mL/h | INTRAMUSCULAR | Status: DC | PRN
  Administered 2017-02-07: 10 mL/h via EPIDURAL
  Filled 2017-02-07: qty 100

## 2017-02-07 MED ORDER — ONDANSETRON HCL 4 MG/2ML IJ SOLN: 4.0000 mg | Freq: Four times a day (QID) | INTRAMUSCULAR | Status: DC | PRN

## 2017-02-07 MED ORDER — SOD CITRATE-CITRIC ACID 500-334 MG/5ML PO SOLN: 30.0000 mL | ORAL | Status: DC | PRN

## 2017-02-07 MED ORDER — DIPHENHYDRAMINE HCL 25 MG PO CAPS: 25.0000 mg | ORAL_CAPSULE | Freq: Four times a day (QID) | ORAL | Status: DC | PRN

## 2017-02-07 MED ORDER — COCONUT OIL OIL: 1.0000 "application " | TOPICAL_OIL | Status: DC | PRN

## 2017-02-07 MED ORDER — OXYCODONE-ACETAMINOPHEN 5-325 MG PO TABS: 2.0000 | ORAL_TABLET | ORAL | Status: DC | PRN

## 2017-02-07 MED ORDER — TERBUTALINE SULFATE 1 MG/ML IJ SOLN
0.2500 mg | Freq: Once | INTRAMUSCULAR | Status: DC | PRN
  Filled 2017-02-07: qty 1

## 2017-02-07 MED ORDER — SIMETHICONE 80 MG PO CHEW: 80.0000 mg | CHEWABLE_TABLET | ORAL | Status: DC | PRN

## 2017-02-07 MED ORDER — LACTATED RINGERS IV SOLN: 500.0000 mL | INTRAVENOUS | Status: DC | PRN

## 2017-02-07 MED ORDER — FLEET ENEMA 7-19 GM/118ML RE ENEM: 1.0000 | ENEMA | RECTAL | Status: DC | PRN

## 2017-02-07 MED ORDER — MISOPROSTOL 50MCG HALF TABLET
50.0000 ug | ORAL_TABLET | ORAL | Status: DC | PRN
  Administered 2017-02-07: 50 ug via ORAL
  Filled 2017-02-07 (×2): qty 1

## 2017-02-07 MED ORDER — OXYCODONE-ACETAMINOPHEN 5-325 MG PO TABS: 1.0000 | ORAL_TABLET | ORAL | Status: DC | PRN

## 2017-02-07 MED ORDER — WITCH HAZEL-GLYCERIN EX PADS: 1.0000 "application " | MEDICATED_PAD | CUTANEOUS | Status: DC | PRN

## 2017-02-07 MED ORDER — ACETAMINOPHEN 325 MG PO TABS
650.0000 mg | ORAL_TABLET | ORAL | Status: DC | PRN
  Administered 2017-02-08 (×3): 650 mg via ORAL
  Filled 2017-02-07 (×3): qty 2

## 2017-02-07 MED ORDER — LACTATED RINGERS IV SOLN: 500.0000 mL | Freq: Once | INTRAVENOUS | Status: DC

## 2017-02-07 MED ORDER — IBUPROFEN 600 MG PO TABS
600.0000 mg | ORAL_TABLET | Freq: Four times a day (QID) | ORAL | Status: DC
  Administered 2017-02-07 – 2017-02-09 (×6): 600 mg via ORAL
  Filled 2017-02-07 (×6): qty 1

## 2017-02-07 MED ORDER — PHENYLEPHRINE 40 MCG/ML (10ML) SYRINGE FOR IV PUSH (FOR BLOOD PRESSURE SUPPORT)
80.0000 ug | PREFILLED_SYRINGE | INTRAVENOUS | Status: DC | PRN
  Filled 2017-02-07: qty 10
  Filled 2017-02-07: qty 5

## 2017-02-07 MED ORDER — PHENYLEPHRINE 40 MCG/ML (10ML) SYRINGE FOR IV PUSH (FOR BLOOD PRESSURE SUPPORT)
80.0000 ug | PREFILLED_SYRINGE | INTRAVENOUS | Status: DC | PRN
  Filled 2017-02-07: qty 5

## 2017-02-07 MED ORDER — CHLORHEXIDINE GLUCONATE CLOTH 2 % EX PADS
6.0000 | MEDICATED_PAD | Freq: Every day | CUTANEOUS | Status: DC
  Administered 2017-02-07: 6 via TOPICAL

## 2017-02-07 MED ORDER — PRENATAL MULTIVITAMIN CH
1.0000 | ORAL_TABLET | Freq: Every day | ORAL | Status: DC
  Administered 2017-02-08: 1 via ORAL
  Filled 2017-02-07: qty 1

## 2017-02-07 MED ORDER — OXYTOCIN 40 UNITS IN LACTATED RINGERS INFUSION - SIMPLE MED
2.5000 [IU]/h | INTRAVENOUS | Status: DC
  Filled 2017-02-07: qty 1000

## 2017-02-07 MED ORDER — TETANUS-DIPHTH-ACELL PERTUSSIS 5-2.5-18.5 LF-MCG/0.5 IM SUSP: 0.5000 mL | Freq: Once | INTRAMUSCULAR | Status: DC

## 2017-02-07 MED ORDER — NICOTINE 14 MG/24HR TD PT24
14.0000 mg | MEDICATED_PATCH | Freq: Every day | TRANSDERMAL | Status: DC
  Administered 2017-02-07: 14 mg via TRANSDERMAL
  Filled 2017-02-07 (×2): qty 1

## 2017-02-07 MED ORDER — LACTATED RINGERS IV SOLN
INTRAVENOUS | Status: DC
  Administered 2017-02-07: 08:00:00 via INTRAVENOUS

## 2017-02-07 NOTE — Anesthesia Preprocedure Evaluation (Signed)
Anesthesia Evaluation  Patient identified by MRN, date of birth, ID band Patient awake    Reviewed: Allergy & Precautions, Patient's Chart, lab work & pertinent test results  Airway Mallampati: I  TM Distance: >3 FB Neck ROM: Full    Dental no notable dental hx. (+) Teeth Intact   Pulmonary Current Smoker,    Pulmonary exam normal breath sounds clear to auscultation       Cardiovascular hypertension, Pt. on medications Normal cardiovascular exam Rhythm:Regular Rate:Normal     Neuro/Psych PSYCHIATRIC DISORDERS Anxiety Depression negative neurological ROS     GI/Hepatic negative GI ROS, Neg liver ROS, GERD  Medicated and Controlled,(+)     substance abuse  IV drug use,   Endo/Other  diabetes, Well Controlled, Gestational  Renal/GU negative Renal ROS  negative genitourinary   Musculoskeletal negative musculoskeletal ROS (+)   Abdominal   Peds  Hematology negative hematology ROS (+)   Anesthesia Other Findings   Reproductive/Obstetrics (+) Pregnancy                             Anesthesia Physical Anesthesia Plan  ASA: II  Anesthesia Plan: Epidural   Post-op Pain Management:    Induction:   PONV Risk Score and Plan:   Airway Management Planned: Natural Airway  Additional Equipment:   Intra-op Plan:   Post-operative Plan:   Informed Consent: I have reviewed the patients History and Physical, chart, labs and discussed the procedure including the risks, benefits and alternatives for the proposed anesthesia with the patient or authorized representative who has indicated his/her understanding and acceptance.     Plan Discussed with: Anesthesiologist  Anesthesia Plan Comments:         Anesthesia Quick Evaluation

## 2017-02-07 NOTE — Progress Notes (Signed)
Early Osmondlisha Vig is a 37 y.o. [id number] at 329w0d by LMP admitted for induction of labor due to Hypertension.  Subjective: Patient resting comfortably in bed. Epidural just placed. Not feeling contractions as much now. No LOF.  Objective: BP (!) 157/96   Pulse 87   Temp 98.2 F (36.8 C) (Oral)   Resp 18   Ht 4\' 9"  (1.448 m)   Wt 119 lb 8 oz (54.2 kg)   LMP 05/24/2016   BMI 25.86 kg/m  No intake/output data recorded. No intake/output data recorded.  FHT:  FHR: 125 bpm, variability: moderate,  accelerations:  Present,  decelerations:  Absent UC:   regular, every 2-4 minutes SVE:   Dilation: 3.5 Effacement (%): 50 Station: -3 Exam by:: Kris HartmannNicole Jones, RN  Labs: Lab Results  Component Value Date   WBC 9.5 02/07/2017   HGB 11.9 (L) 02/07/2017   HCT 35.2 (L) 02/07/2017   MCV 89.8 02/07/2017   PLT 227 02/07/2017    Ass[facility]  Labor: Progressing normally received cytotec x2 epidual in place will give 2hours and check if minimal progress will start pitocin Preeclampsia:  no signs or symptoms of toxicity Fetal Wellbeing:  Category I Pain Control:  Epidural I/D:  n/a Anticipated MOD:  NSVD  Shahin Knierim 02/07/2017, 2:50 PM

## 2017-02-07 NOTE — Anesthesia Procedure Notes (Signed)
Epidural Patient location during procedure: OB Start time: 02/07/2017 2:19 PM  Staffing Anesthesiologist: Mal AmabileFoster, Emmeline Winebarger, MD Performed: anesthesiologist   Preanesthetic Checklist Completed: patient identified, site marked, surgical consent, pre-op evaluation, timeout performed, IV checked, risks and benefits discussed and monitors and equipment checked  Epidural Patient position: sitting Prep: site prepped and draped and DuraPrep Patient monitoring: continuous pulse ox and blood pressure Approach: midline Location: L3-L4 Injection technique: LOR air  Needle:  Needle type: Tuohy  Needle gauge: 17 G Needle length: 9 cm and 9 Needle insertion depth: 4 cm Catheter type: closed end flexible Catheter size: 19 Gauge Catheter at skin depth: 9 cm Test dose: negative and Other  Assessment Events: blood not aspirated, injection not painful, no injection resistance, negative IV test and no paresthesia  Additional Notes Patient identified. Risks and benefits discussed including failed block, incomplete  Pain control, post dural puncture headache, nerve damage, paralysis, blood pressure Changes, nausea, vomiting, reactions to medications-both toxic and allergic and post Partum back pain. All questions were answered. Patient expressed understanding and wished to proceed. Sterile technique was used throughout procedure. Epidural site was Dressed with sterile barrier dressing. No paresthesias, signs of intravascular injection Or signs of intrathecal spread were encountered.  Patient was more comfortable after the epidural was dosed. Please see RN's note for documentation of vital signs and FHR which are stable.

## 2017-02-07 NOTE — Progress Notes (Signed)
Alexis Avery is a 37 y.o. [id number] at 2621w0d by LMP admitted for induction of labor due to Gestational HTN  Subjective: Patient is resting comfortable in bed. Having a boy. Will consider epidural. Does feel her contraction but able to tolerate at this time  Objective: BP (!) 129/94   Pulse 86   Temp 98.2 F (36.8 C) (Oral)   Resp 18   Ht 4\' 9"  (1.448 m)   Wt 119 lb 8 oz (54.2 kg)   LMP 05/24/2016   BMI 25.86 kg/m  No intake/output data recorded. No intake/output data recorded.  FHT:  FHR: 140 bpm, variability: moderate,  accelerations:  Present,  decelerations:  Absent UC:   irregular, every 1-4 minutes SVE:   Dilation: 1.5 Effacement (%): 30 Station: -3 Exam by:: Dr. Luberta RobertsonWinborne  Labs: Lab Results  Component Value Date   WBC 9.5 02/07/2017   HGB 11.9 (L) 02/07/2017   HCT 35.2 (L) 02/07/2017   MCV 89.8 02/07/2017   PLT 227 02/07/2017    Assessment / Plan: Induction of labor due to gestational hypertension,  progressing well on pitocin  Labor: buccal cytotec will consider foley bulb and pitocin Preeclampsia:  no signs or symptoms of toxicity Fetal Wellbeing:  Category I Pain Control:  Per patient request  I/D:  n/a Anticipated MOD:  NSVD  Alexis Avery 02/07/2017, 9:11 AM

## 2017-02-07 NOTE — Anesthesia Pain Management Evaluation Note (Signed)
  [facility]NA Pain Management Visit Note  Patient: Alexis Avery, 37 y.o., female  "Hello [facility] am a [facility]. We have an anesthesia team available at all times to provide care throughout the hospital, including epidural management and anesthesia for C-section. I don't know your plan for the delivery whether it a natural birth, water birth, IV sedation, nitrous supplementation, doula or epidural, but we want to meet your pain goals."   1.Was your pain managed to your expectations on prior hospitalizations?   Yes   2.What is your expectation for pain management during this hospitalization?     Epidural  3.How can we help you reach that goal? epidural  Record the patient's initial score and the patient's pain goal.   Pain: 3  Pain Goal: 5 [facility] wants you to be able to say your pain was always managed very well.  Jimmie Dattilio 02/07/2017

## 2017-02-07 NOTE — H&P (Signed)
OBSTETRIC ADMISSION HISTORY AND PHYSICAL  Alexis Avery is a 37 y.o. female [phone] with IUP at [redacted]w[redacted]d by LMP = 32wk u/s presenting for IOL for gHTN. She has a history of GDMA1, nicotine addiction, opiate dependence, MRSA+, late to prenatal care all with this preg. She denies use of opiates (H/o heroin and pain pill use) during pregnancy, last use 02/2016. She reports +FMs, No LOF, no VB, no blurry vision, headaches or peripheral edema, and RUQ pain.  She plans on bottle feeding. She request BTL for birth control. She received her prenatal care at [facility], started at 32 weeks, signed on 01/23/17.  Dating: By  LMP = 32wk u/s --->  Estimated Date of Delivery: 02/28/17  Sono:  @[redacted]w[redacted]d , CWD, normal anatomy, 2219g, 76% EFW.   Prenatal History/Complications:  Past Medical History: Past Medical History:  Diagnosis Date  . Anxiety   . Depression   . Gestational diabetes mellitus, antepartum    GESTATIONAL  . 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 Left genital labial abscess 12/2015  . MRSA (methicillin resistant staph aureus) culture positive   . Nausea and vomiting in pregnancy 10/24/2012  . Nicotine addiction 10/24/2012  . Preterm labor     Past Surgical History: Past Surgical History:  Procedure Laterality Date  . CHOLECYSTECTOMY    . TONSILLECTOMY      Obstetrical History: OB History    Gravida Para Term Preterm AB Living   4 2 1 1 1 2    SAB TAB Ectopic Multiple Live Births   1       2      Social History: Social History   Socioeconomic History  . Marital status: Married    Spouse name: None  . Number of children: None  . Years of education: None  . Highest education level: None  Social Needs  . Financial resource strain: None  . Food insecurity - worry: None  . Food insecurity - inability: None  . Transportation needs - medical: None  . Transportation needs - non-medical: None  Occupational History  . None  Tobacco Use  . Smoking status: Current Every Day Smoker    Packs/day: 0.50     Types: Cigarettes  . Smokeless tobacco: Never Used  Substance and Sexual Activity  . Alcohol use: No  . Drug use: No    Comment: not used heroin since Feb 2018  . Sexual activity: Yes    Birth control/protection: None  Other Topics Concern  . None  Social History Narrative  . None    Family History: Family History  Problem Relation Age of Onset  . Hypertension Mother   . Cancer Maternal Grandmother        breast  . Hypertension Maternal Uncle   . Heart attack Maternal Uncle   . Heart attack Father     Allergies: No Known Allergies  Medications Prior to Admission  Medication Sig Dispense Refill Last Dose  . acetaminophen (TYLENOL) 500 MG tablet Take 1,000 mg by mouth every 6 (six) hours as needed for moderate pain or headache.   Taking  . cetirizine (ZYRTEC) 10 MG tablet Take 10 mg by mouth daily.   Taking  . clotrimazole (GYNE-LOTRIMIN) 1 % vaginal cream Place 1 Applicatorful vaginally at bedtime. (Patient not taking: Reported on 02/02/2017) 30 g 0 Not Taking  . metroNIDAZOLE (FLAGYL) 500 MG tablet Take 1 tablet (500 mg total) by mouth 2 (two) times daily. 14 tablet 0 Taking  . Prenatal Vit-Fe Fumarate-FA (PRENATAL MULTIVITAMIN) TABS tablet  Take 1 tablet by mouth daily at 12 noon.   Taking  . ranitidine (ZANTAC) 150 MG tablet Take 150 mg by mouth daily as needed for heartburn.   Taking     Review of Systems   All systems reviewed and negative except as stated in HPI  Last menstrual period 05/24/2016. General appearance: alert, cooperative, appears stated age and no distress Lungs: clear to auscultation bilaterally Heart: regular rate and rhythm Abdomen: soft, non-tender; bowel sounds normal Extremities: Homans sign is negative, no sign of DVT Presentation: unsure Fetal monitoringBaseline: 140 bpm, Variability: Good {> 6 bpm), Accelerations: Reactive and Decelerations: Absent Uterine activity intermittent CTX     Prenatal labs: ABO, Rh: --/--/O POS (12/29  1642) Antibody:   Rubella: 4.03 (12/30 0804) RPR: Non Reactive (12/29 1642)  HBsAg: Negative (12/30 0804)  HIV: Non Reactive (12/29 1642)  GBS:   neg 1 hr Glucola = GDM Genetic screening  Too late Anatomy US normal female  Prenatal Transfer Tool  Maternal Diabetes: Yes:  Diabetes Type:  Diet controlled Genetic Screening: Declined Maternal Ultrasounds/Referrals: Normal Fetal Ultrasounds or other Referrals:  Referred to Materal Fetal Medicine  Maternal Substance Abuse:  Yes:  Type: Smoker, Marijuana (+THC 12/29) Significant Maternal Medications:  Meds include: Zantac Significant Maternal Lab Results: Lab values include: Group B Strep negative, Other: MRSA+  No results found for this or any previous visit (from the past 24 hour(s)).  Patient Active Problem List   Diagnosis Date Noted  . Encounter for induction of labor 02/07/2017  . Cervical high risk human papillomavirus (HPV) DNA test positive 01/23/2017  . Abdominal pain in pregnancy, third trimester   . Pregnancy with 32 completed weeks gestation   . Gestational hypertension 01/07/2017  . No prenatal care in current pregnancy 01/07/2017  . Supervision of high risk pregnancy, antepartum, third trimester 01/07/2017  . MRSA (methicillin resistant staph aureus) culture positive   . Opiate dependence (HCC) 01/21/2014  . GDM (gestational diabetes mellitus) 04/18/2013  . History of preterm delivery, currently pregnant 01/16/2013  . Drug use complicating pregnancy 01/16/2013  . Nicotine addiction 10/24/2012  . Anxiety 10/24/2012    Assessment/Plan:  Alexis Avery is a 37 y.o. [id number] at 8335w0d here for IOL for gHTN  #Labor: will induce for gHTN, starting with cytotec, expect to need pitocin gtt #Pain: Will request epidural #FWB:  reactive NST #ID:  GBS neg #MOF: bottle #MOC: BTL, interval #Circ:  Yes, outpt  Alroy BailiffParker W Leland, MD  02/07/2017, 7:50 AM  CNM attestation:  I have seen and examined this patient; I agree with  above documentation in the resident's note.   Alexis Avery is a 37 y.o. [phone] here for IOL due to gHTN, also with GDMA1, hx heroin/pain pill use with none since Feb 2018; denies s/s pre-e  PE: BP (!) 142/91   Pulse 76   Temp 98.2 F (36.8 C) (Oral)   Resp 16   Ht 4\' 9"  (1.448 m)   Wt 54.2 kg (119 lb 8 oz)   LMP 05/24/2016   SpO2 99%   BMI 25.86 kg/m  Gen: calm comfortable, NAD Resp: normal effort, no distress Abd: gravid  ROS, labs, PMH reviewed CBC    Component Value Date/Time   WBC 9.5 02/07/2017 0745   RBC 3.92 02/07/2017 0745   HGB 11.9 (L) 02/07/2017 0745   HCT 35.2 (L) 02/07/2017 0745   PLT 227 02/07/2017 0745   MCV 89.8 02/07/2017 0745   MCH 30.4 02/07/2017 0745   MCHC  33.8 02/07/2017 0745   RDW 14.0 02/07/2017 0745   LYMPHSABS 2.2 01/07/2017 1642   MONOABS 0.3 01/07/2017 1642   EOSABS 0.1 01/07/2017 1642   BASOSABS 0.0 01/07/2017 1642    Plan: Admit to [employer] Cx ripening with cytotec followed either by repeated doses or Pit/AROM Will collect P/C ratio and CMP Watch BPs and s/s pre-e Anticipate SVD Plan interval BTL due to signing of papers <30d  Cam Hai CNM 02/07/2017, 3:12 PM

## 2017-02-07 NOTE — Progress Notes (Addendum)
Patient ID: Alexis Avery, female   DOB: [birth year], 37 y.o.   MRN: [id number]  CTSP for FHR decel to nadir of 60 x 5 mins that began when pt was bearing down slightly. With O2, fluids, and position changes FHR recovered to 120s, +variability. Ctx still spont. Cx C/C/vtx +1. Had been pushing a little earlier without much result; pt was currently laboring down when the decel occurred, but she had been bearing down a little. Pt believes she can labor down some more to allow her pushing to be more effective.  Addendum: P/C ratio: 0.16; LFTs neg; BP 115/85 Random glucose: 104  Mercer Stallworth CNM 02/07/2017 6:16 PM

## 2017-02-07 NOTE — Progress Notes (Signed)
Alexis Avery is a 37 y.o. [id number] at 4746w0d by LMP admitted for induction of labor due to Gestational diabetes and Hypertension.  Subjective:   Objective: BP 125/80   Pulse 84   Temp 98.2 F (36.8 C) (Oral)   Resp 16   Ht 4\' 9"  (1.448 m)   Wt 119 lb 8 oz (54.2 kg)   LMP 05/24/2016   SpO2 99%   BMI 25.86 kg/m  No intake/output data recorded. No intake/output data recorded.  FHT:  FHR: 145 bpm, variability: moderate,  accelerations:  Present,  decelerations:  Absent UC:   regular, every 2 minutes SVE:   9/100/0  Labs: Lab Results  Component Value Date   WBC 9.5 02/07/2017   HGB 11.9 (L) 02/07/2017   HCT 35.2 (L) 02/07/2017   MCV 89.8 02/07/2017   PLT 227 02/07/2017    Assessment / Plan: Induction of labor due to gestational hypertension and gestational diabetes,  progressing well on pitocin  Labor: Progressing normally  cytotec x 1 dose, 9/100/0 AROM @ 1640 clear fluid  Preeclampsia:  labs pending, monitoring BP Fetal Wellbeing:  Category I Pain Control:  Epidural I/D:  n/a Anticipated MOD:  NSVD  Unique Searfoss 02/07/2017, 4:44 PM

## 2017-02-08 LAB — CBC
HCT: 24.6 % — ABNORMAL LOW (ref 36.0–46.0)
HEMOGLOBIN: 8.6 g/dL — AB (ref 12.0–15.0)
MCH: 31.2 pg (ref 26.0–34.0)
MCHC: 35 g/dL (ref 30.0–36.0)
MCV: 89.1 fL (ref 78.0–100.0)
Platelets: 184 10*3/uL (ref 150–400)
RBC: 2.76 MIL/uL — AB (ref 3.87–5.11)
RDW: 14 % (ref 11.5–15.5)
WBC: 12.4 10*3/uL — ABNORMAL HIGH (ref 4.0–10.5)

## 2017-02-08 MED ORDER — METRONIDAZOLE 500 MG PO TABS
2000.0000 mg | ORAL_TABLET | Freq: Once | ORAL | Status: AC
Start: 2017-02-08 — End: 2017-02-08
  Administered 2017-02-08: 2000 mg via ORAL
  Filled 2017-02-08: qty 4

## 2017-02-08 MED ORDER — FERROUS SULFATE 325 (65 FE) MG PO TABS
325.0000 mg | ORAL_TABLET | Freq: Two times a day (BID) | ORAL | Status: DC
  Administered 2017-02-08 – 2017-02-09 (×3): 325 mg via ORAL
  Filled 2017-02-08 (×3): qty 1

## 2017-02-08 NOTE — Anesthesia Postprocedure Evaluation (Signed)
Anesthesia Post Note  Patient: Alexis Avery  Procedure(s) Performed: AN AD HOC LABOR EPIDURAL     Patient location during evaluation: Mother Baby Anesthesia Type: Epidural Level of consciousness: awake Pain management: pain level controlled Vital Signs Assessment: post-procedure vital signs reviewed and stable Respiratory status: spontaneous breathing Cardiovascular status: stable Postop Assessment: patient able to bend at knees and epidural receding Anesthetic complications: no    Last Vitals:  Vitals:   02/08/17 0200 02/08/17 0611  BP: 121/61 121/83  Pulse: 81 90  Resp: 18 18  Temp: 36.9 C 36.6 C  SpO2:      Last Pain:  Vitals:   02/08/17 0611  TempSrc: Oral  PainSc: 3    Pain Goal:                 Edison PaceWILKERSON,[name]

## 2017-02-08 NOTE — Progress Notes (Signed)
POSTPARTUM PROGRESS NOTE  Post Partum Day 1  Subjective:  [name] is a 37 y.o. [id number] s/p VAVD at 6875w0d.  No acute events overnight.  Pt denies problems with ambulating, voiding or po intake.  She denies nausea or vomiting.  Pain is moderately controlled.  She has had flatus. She has not had bowel movement.  Lochia Minimal.   Objective: Blood pressure 121/61, pulse 81, temperature 98.4 F (36.9 C), temperature source Oral, resp. rate 18, height 4\' 9"  (1.448 m), weight 54.2 kg (119 lb 8 oz), last menstrual period 05/24/2016, SpO2 100 %, unknown if currently breastfeeding.  Physical Exam:  General: alert, cooperative and no distress Chest: no respiratory distress Heart:regular rate, distal pulses intact Abdomen: soft, nontender,  Uterine Fundus: firm, appropriately tender DVT Evaluation: No calf swelling or tenderness Extremities: trace edema Skin: warm, dry  Recent Labs    02/07/17 0745  HGB 11.9*  HCT 35.2*    Assessment/Plan: [name] is a 37 y.o. [id number] s/p VAVD at 6975w0d   PPD#1 - Doing well Contraception: BTL Feeding: bottle Dispo: Plan for discharge tomorrow.   LOS: 1 day   Alexis BailiffParker W Taten Merrow MD 02/08/2017, 5:17 AM

## 2017-02-09 LAB — MRSA CULTURE: CULTURE: NOT DETECTED

## 2017-02-09 MED ORDER — IBUPROFEN 600 MG PO TABS: 600.0000 mg | ORAL_TABLET | Freq: Four times a day (QID) | ORAL | 0 refills | Status: DC

## 2017-02-09 MED ORDER — SERTRALINE HCL 25 MG PO TABS: 25.0000 mg | ORAL_TABLET | Freq: Every day | ORAL | 2 refills | Status: DC

## 2017-02-09 MED ORDER — POLYETHYLENE GLYCOL 3350 17 G PO PACK: 17.0000 g | PACK | Freq: Every day | ORAL | 0 refills | Status: DC

## 2017-02-09 MED ORDER — FERROUS SULFATE 325 (65 FE) MG PO TABS: 325.0000 mg | ORAL_TABLET | Freq: Two times a day (BID) | ORAL | 3 refills | Status: DC

## 2017-02-09 NOTE — Discharge Summary (Signed)
OB Discharge Summary     Patient Name: Alexis Avery DOB: Sep 14, 1980 MRN: [id number]  Date of admission: 02/07/2017 Delivering MD: Conan Bowens   Date of discharge: 02/09/2017  Admitting diagnosis: INDUCTION Intrauterine pregnancy: [redacted]w[redacted]d     Secondary diagnosis:  Principal Problem:   Supervision of high risk pregnancy, antepartum, third trimester Active Problems:   History of preterm delivery, currently pregnant   Drug use complicating pregnancy   GDM (gestational diabetes mellitus)   Gestational hypertension   Cervical high risk human papillomavirus (HPV) DNA test positive   Encounter for induction of labor   Gestational diabetes mellitus in pregnancy   Vaginal delivery  Additional problems: trichomoniasis  treated while in hospital      Discharge diagnosis: Gestational Hypertension, GDM A1 and VAVD and abruption                                                                                           Post partum procedures:none   Augmentation: AROM, Pitocin and Cytotec  Complications: Placental Abruption  Hospital course:  Induction of Labor With Vaginal Delivery   37 y.o. yo [phone] at [redacted]w[redacted]d was admitted to the hospital 02/07/2017 for induction of labor.  Indication for induction: Gestational hypertension and A1 DM.  Patient had an uncomplicated labor course as follows: Membrane Rupture Time/Date: 4:30 PM ,02/07/2017   Intrapartum Procedures: Episiotomy: None [1]                                         Lacerations:  None [1]  Patient had delivery of a Viable infant.  Information for the patient's newborn:  Stefanie, Hodgens [[id number]]  Delivery Method: Vaginal, Vacuum (Extractor)(Filed from Delivery Summary)  Patient was noted to be complete and and started to push. The head was not descending well and when bearing down FHR to nadir of 60. Blood clotts where noted and due to nadir vacuum assistance was needed. Patient was noted to have placental abruption.Baby and  placenta were delivered and bleeding decreased. Started on iron postpartum swallowing was consulted due to history 02/07/2017  Details of delivery can be found in separate delivery note.  Patient had a routine postpartum course.SW Patient is discharged home 02/09/17.  Physical exam  Vitals:   02/08/17 0611 02/08/17 1149 02/08/17 1917 02/09/17 0639  BP: 121/83 113/69 95/67 129/84  Pulse: 90 84 87 90  Resp: 18 18 18 20   Temp: 97.9 F (36.6 C) (!) 97.4 F (36.3 C) 98.6 F (37 C) (!) 97.4 F (36.3 C)  TempSrc: Oral Oral Oral Oral  SpO2:      Weight:      Height:       General: alert, cooperative and no distress Lochia: appropriate Uterine Fundus: firm Incision: N/A DVT Evaluation: No evidence of DVT seen on physical exam. Negative Homan's sign. No cords or calf tenderness. Labs: Lab Results  Component Value Date   WBC 12.4 (H) 02/08/2017   HGB 8.6 (L) 02/08/2017   HCT 24.6 (L) 02/08/2017   MCV 89.1  02/08/2017   PLT 184 02/08/2017   CMP Latest Ref Rng & Units 02/07/2017  Glucose 65 - 99 mg/dL 409(W104(H)  BUN 6 - 20 mg/dL 15  Creatinine 1.190.44 - 1.471.00 mg/dL 8.29(F0.41(L)  Sodium 621135 - 308145 mmol/L 133(L)  Potassium 3.5 - 5.1 mmol/L 3.6  Chloride 101 - 111 mmol/L 106  CO2 22 - 32 mmol/L 18(L)  Calcium 8.9 - 10.3 mg/dL 6.5(H8.5(L)  Total Protein 6.5 - 8.1 g/dL 8.4(O5.6(L)  Total Bilirubin 0.3 - 1.2 mg/dL 9.6(E0.2(L)  Alkaline Phos 38 - 126 U/L 228(H)  AST 15 - 41 U/L 16  ALT 14 - 54 U/L 8(L)    Discharge instruction: per After Visit Summary and "Baby and Me Booklet".  After visit meds:  Allergies as of 02/09/2017   No Known Allergies     Medication List    STOP taking these medications   acetaminophen 500 MG tablet Commonly known as:  TYLENOL   clotrimazole 1 % vaginal cream Commonly known as:  GYNE-LOTRIMIN   metroNIDAZOLE 500 MG tablet Commonly known as:  FLAGYL     TAKE these medications   cetirizine 10 MG tablet Commonly known as:  ZYRTEC Take 10 mg by mouth daily.   ferrous  sulfate 325 (65 FE) MG tablet Take 1 tablet (325 mg total) by mouth 2 (two) times daily with a meal.   ibuprofen 600 MG tablet Commonly known as:  ADVIL,MOTRIN Take 1 tablet (600 mg total) by mouth every 6 (six) hours.   prenatal multivitamin Tabs tablet Take 1 tablet by mouth daily at 12 noon.   ranitidine 150 MG tablet Commonly known as:  ZANTAC Take 150 mg by mouth daily as needed for heartburn.       Diet: routine diet  Activity: Advance as tolerated. Pelvic rest for 6 weeks.   Outpatient follow up:1 week for BP check and 4 weeks for post partum  Follow up Appt: Future Appointments  Date Time Provider Department Center  02/20/2017  9:35 AM Conan Bowensavis, [name], MD [facility]  03/15/2017  8:15 AM Conan Bowensavis, [name], MD [facility]   Follow up Visit:No Follow-up on file.  Postpartum contraception: BTL   Newborn Data: Live born female  Birth Weight: 6 lb 7.5 oz (2934 g) APGAR: 6, 8  Newborn Delivery   Birth date/time:  02/07/2017 19:29:00 Delivery type:  Vaginal, Vacuum (Extractor)     Baby Feeding: Bottle Disposition:home with mother   02/09/2017 Nigel Bridgemanourtland Shomari Scicchitano, MD

## 2017-02-09 NOTE — Clinical Social Work Maternal (Signed)
CLINICAL SOCIAL WORK MATERNAL/CHILD NOTE  Patient Details  Name: Alexis Avery MRN: [id number] Date of Birth: 06/21/1980  Date:  02/09/2017  Clinical Social Worker Initiating Note:  Kearia Yin Boyd-Gilyard Date/Time: Initiated:  02/08/17/1346     Child's Name:  Alexis Avery   Biological Parents:  Mother, Father   Need for Interpreter:  None   Reason for Referral:  Behavioral Health Concerns, Late or No Prenatal Care , Current Substance Use/Substance Use During Pregnancy    Address:  [address] [place] [address]    Phone number:  [phone] (home)     Additional phone number:  Household Members/Support Persons (HM/SP):   Household Member/Support Person 1, Household Member/Support Person 2, Household Member/Support Person 3   HM/SP Name Relationship DOB or Age  HM/SP -1 Paul Avery FOB 03/25/1978  HM/SP -2 Hailey Worrell daughter 09/04/09  HM/SP -3 Hunter Avery son 04/28/13  HM/SP -4        HM/SP -5        HM/SP -6        HM/SP -7        HM/SP -8          Natural Supports (not living in the home):  Parent, Immediate Family   Professional Supports: None   Employment: Unemployed   Type of Work:     Education:  High school graduate   Homebound arranged:    Financial Resources:  Medicaid   Other Resources:  Food Stamps , WIC   Cultural/Religious Considerations Which May Impact Care:  Per MOB's Face Sheet, MOB is [employer].   Strengths:  Ability to meet basic needs , Understanding of illness, Home prepared for child , Pediatrician chosen   Psychotropic Medications:         Pediatrician:    [place] area  Pediatrician List:   [facility]  [place]    [place]    [place]    [place]    [place]      Pediatrician Fax Number:    Risk Factors/Current Problems:  Mental Health Concerns , Substance Use    Cognitive State:  Alert , Able to Concentrate , Linear Thinking    Mood/Affect:   Happy , Relaxed , Interested , Comfortable    CSW Assessment: CSW met with MOB in room 116 to complete an assessment for SA hx, MH hx, and Limited/Late PNC.  When CSW arrived, MOB was in bed bonding with infant and MOB's stepdaughter was relaxing on the couch.  With MOB's permission, CSW asked the stepdaughter to leave the room in order to meet with MOB in private. MOB was polite, easy to engage, and receptive to meeting with CSW.  CSW asked about MOB's MH hx.  MOB acknowledged a hx of anxiety/depression since teenage years.  MOB reported that MOB was regulated with medication until about 1 year ago (Celexa). MOB communicated that MOB was receiving medication management at [facility].  However, MOB is not longer an established patient there. MOB was open to resources for outpatient clinics and appeared eager to become an established patient with one of the agencies that CSW provided.  MOB shared the benefits of MOB being regulated with medication. CSW encouraged MOB to make contact as soon as possible or to utilize the agencies walk-in clinic; MOB agreed. CSW provided education regarding the baby blues period vs. perinatal mood disorders, discussed treatment and gave resources for mental health follow up if concerns arise.  CSW recommends   self-evaluation during the postpartum time period using the New Mom Checklist from Postpartum Progress and encouraged MOB to contact a medical professional if symptoms are noted at any time.  MOB presented with insight and awareness about her MH and did not present with any acute signs or symptoms. MOB denied having PPD with MOB's older 2 children.  CSW assessed for safety and MOB denied SI, HI, and DV.  CSW also asked about MOB's SA hx.  MOB was proud to report that MOB has been in her sobriety from heroin for almost 1 year (02/10/2017 will make 1 year).   CSW congratulated MOB and praised her for her success. MOB did not acknowledged the use of marijuana but  was understanding of the hospital's policy. MOB is aware that infant's UDS was negative and that CSW will continue to monitor infant's CDS and will make a report to [place] CPS if needed.  MOB shared that MOB has a CPS hx and MOB's last case was open/closed in 2018.  MOB explained that MOB overdosed and  CPS became involved.  CSW verified with CPS that MOB does not currently have an open case. CSW also shared with MOB that MOB can received SA counseling at the agencies that CSW provided.  MOB communicated that MOB attends NA meetings 2x weekly and has been pleased with her outcome.   MOB reports having all necessary items for infant and feeling prepared to parent.  CSW thanked MOB for meeting with CSW.   CSW Plan/Description:  No Further Intervention Required/No Barriers to Discharge, Sudden Infant Death Syndrome (SIDS) Education, Perinatal Mood and Anxiety Disorder (PMADs) Education, Other Patient/Family Education, Hospital Drug Screen Policy Information, CSW Will Continue to Monitor Umbilical Cord Tissue Drug Screen Results and Make Report if Warranted   Shamira Toutant Boyd-Gilyard, MSW, LCSW Clinical Social Work [phone]   Sonna Lipsky D BOYD-GILYARD, LCSW 02/09/2017, 8:49 AM 

## 2017-02-20 ENCOUNTER — Ambulatory Visit: Payer: Self-pay | Admitting: Clinical

## 2017-02-20 ENCOUNTER — Ambulatory Visit (INDEPENDENT_AMBULATORY_CARE_PROVIDER_SITE_OTHER): Payer: Medicaid Other | Admitting: Obstetrics and Gynecology

## 2017-02-20 ENCOUNTER — Encounter: Payer: Self-pay | Admitting: Obstetrics and Gynecology

## 2017-02-20 VITALS — BP 130/90 | HR 85 | Ht <= 58 in | Wt 106.0 lb

## 2017-02-20 DIAGNOSIS — N816 Rectocele: Secondary | ICD-10-CM

## 2017-02-20 DIAGNOSIS — Z3009 Encounter for other general counseling and advice on contraception: Secondary | ICD-10-CM

## 2017-02-20 DIAGNOSIS — N719 Inflammatory disease of uterus, unspecified: Secondary | ICD-10-CM

## 2017-02-20 DIAGNOSIS — F419 Anxiety disorder, unspecified: Secondary | ICD-10-CM

## 2017-02-20 MED ORDER — AMOXICILLIN-POT CLAVULANATE 875-125 MG PO TABS: 1.0000 | ORAL_TABLET | Freq: Two times a day (BID) | ORAL | 0 refills | Status: DC

## 2017-02-20 NOTE — Progress Notes (Signed)
OB/GYN FOLLOW UP NOTE  History:  37 y.o. [id number] here today for follow up for  "a bulge." She is s/p VAVD on 02/07/17.  Reports on Friday, she noticed something feeling like "it was coming out" and also has back pain. Bleeding has lightened significantly but is still spotting. Also some dysuria today. Denies fever/chills/nausea/vomiting. Is having regular bowel movements, last one was yesterday am, did not notice any change in bulge with BM. Baby is doing well. Not breastfeeding.    Past Medical History:  Diagnosis Date  . Anxiety   . Depression   . Gestational diabetes mellitus, antepartum    GESTATIONAL  . 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 Left genital labial abscess 12/2015  . MRSA (methicillin resistant staph aureus) culture positive   . Nausea and vomiting in pregnancy 10/24/2012  . Nicotine addiction 10/24/2012  . Preterm labor     Past Surgical History:  Procedure Laterality Date  . CHOLECYSTECTOMY    . TONSILLECTOMY      Current Outpatient Medications:  .  cetirizine (ZYRTEC) 10 MG tablet, Take 10 mg by mouth daily., Disp: , Rfl:  .  ferrous sulfate 325 (65 FE) MG tablet, Take 1 tablet (325 mg total) by mouth 2 (two) times daily with a meal., Disp: 60 tablet, Rfl: 3 .  ibuprofen (ADVIL,MOTRIN) 600 MG tablet, Take 1 tablet (600 mg total) by mouth every 6 (six) hours., Disp: 30 tablet, Rfl: 0 .  polyethylene glycol (MIRALAX) packet, Take 17 g by mouth daily., Disp: 14 each, Rfl: 0 .  Prenatal Vit-Fe Fumarate-FA (PRENATAL MULTIVITAMIN) TABS tablet, Take 1 tablet by mouth daily at 12 noon., Disp: , Rfl:  .  sertraline (ZOLOFT) 25 MG tablet, Take 1 tablet (25 mg total) by mouth daily., Disp: 30 tablet, Rfl: 2  The following portions of the patient's history were reviewed and updated as appropriate: allergies, current medications, past family history, past medical history, past social history, past surgical history and problem list.   Review of Systems:  Pertinent items noted in HPI and  remainder of comprehensive ROS otherwise negative.   Objective:  Physical Exam BP 130/90   Pulse 85   Ht 4\' 9"  (1.448 m)   Wt 106 lb (48.1 kg)   Breastfeeding? No   BMI 22.94 kg/m  CONSTITUTIONAL: Well-developed, well-nourished female in no acute distress.  HENT:  Normocephalic, atraumatic. External right and left ear normal. Oropharynx is clear and moist EYES: Conjunctivae and EOM are normal. Pupils are equal, round, and reactive to light. No scleral icterus.  NECK: Normal range of motion, supple, no masses SKIN: Skin is warm and dry. No rash noted. Not diaphoretic. No erythema. No pallor. NEUROLOGIC: Alert and oriented to person, place, and time. Normal reflexes, muscle tone coordination. No cranial nerve deficit noted. PSYCHIATRIC: Normal mood and affect. Normal behavior. Normal judgment and thought content. CARDIOVASCULAR: Normal heart rate noted RESPIRATORY: Effort and breath sounds normal, no problems with respiration noted ABDOMEN: Soft, no distention noted.   PELVIC: normal appearing external female genitalia, normal appearing cervix with some reddish green tinged discharge from cervix, moderately tender uterus to palpation, no descent of cervix with valsalva, very small rectocele not tender to touch MUSCULOSKELETAL: Normal range of motion. No edema noted.  Labs and Imaging   Assessment & Plan:   1. Rectocele Reviewed kegel exercises  2. Endometritis Exam and complaints suspicious for post partum late onset endometritis Will start augmentin x 7 days  3. Encounter for counseling regarding contraception Reviewed  options for long-term birth control including Depo-Provera, Nexplanon, IUDs (copper and levonorgestrol). Thoroughly reviewed risks/benefits/side effects of each. Answered all questions. Patient would like to do IUD rather than BTL she previously decided on.  Return for pp visit   Routine preventative health maintenance measures emphasized. Please refer to  After Visit Summary for other counseling recommendations.   Return in about 2 weeks (around 03/06/2017) for post partum check.  Total face-to-face time with patient: 20 minutes. Over 50% of encounter was spent on counseling and coordination of care.  Baldemar Lenis, M.D. Attending Obstetrician & Gynecologist, [facility] for [employer], [facility]

## 2017-02-20 NOTE — BH Specialist Note (Signed)
Integrated Behavioral Health Initial Visit  MRN: [id number] Name: Alexis Avery  Number of Integrated Behavioral Health Clinician visits:: 1/6 Session Start time: 10:28 Session End time: 10:40 Total time: 15 minutes  Type of Service: Integrated Behavioral Health- Individual/Family Interpretor:No. Interpretor Name and Language: n/a   Warm Hand Off Completed.       SUBJECTIVE: Alexis Osmondlisha Fitton is a 37 y.o. female accompanied by n/a Patient was referred by Dr Earlene Plateravis for anxiety. Patient reports the following symptoms/concerns: Pt states she is somewhat anxious today to get back to her baby, that she is unable to bring today because of flu restrictions, as well as being a new mom "all over again".  She has had anxiety with panic attacks "for years", began Zoloft less than 2 weeks ago, has not noticed any negative side effects, and [facility], prior to her previous pregnancy. Pt agrees to re-establish care with community mental health for [facility]. Duration of problem: Ongoing for years; Severity of problem: moderate  OBJECTIVE: Mood: Anxious and Affect: Appropriate Risk of harm to self or others: No plan to harm self or others  LIFE CONTEXT: Family and Social: Pt lives with husband and 3 children(7yo daughter, 3yo son; newborn son) School/Work: - Self-Care: No current substances; baby and pt are both sleeping well postpartum Life Changes: Recent childbirth  GOALS ADDRESSED: Patient will: 1. Reduce symptoms of: anxiety 2. Increase knowledge and/or ability of: self-management skills  3. Demonstrate ability to: Increase healthy adjustment to current life circumstances and Increase adequate support systems for patient/family  INTERVENTIONS: Interventions utilized: Medication Monitoring, Psychoeducation and/or Health Education and Link to [facility]  Standardized Assessments completed: GAD-7 and PHQ 9  ASSESSMENT: Patient  currently experiencing Anxiety disorder, unspecified type.   Patient may benefit from psychoeducation and brief therapeutic intervention regarding coping with symptoms of anxiety.  PLAN: 1. Follow up with behavioral health clinician on : As needed 2. Behavioral recommendations:  -Re-establish with either [facility] or [facility] walk-in clinic within the next two weeks -Read educational material regarding coping with symptoms of anxiety with panic attacks -Consider Mom Talk support group [facility] T[facility], 10am (no registration needed, but call to confirm)  3. Referral(s): Integrated [facility] (In Clinic) and [facility] (LME/Outside Clinic) 4. "From scale of 1-10, how likely are you to follow plan?": 8  Rae LipsJamie C McMannes, LCSW   Depression screen [facility] 2/9 02/02/2017 01/23/2017 02/21/2013  Decreased Interest 1 1 0  Down, Depressed, Hopeless 0 1 0  PHQ - 2 Score 1 2 0  Altered sleeping 1 1 -  Tired, decreased energy 1 1 -  Change in appetite 0 0 -  Feeling bad or failure about yourself  0 0 -  Trouble concentrating 1 0 -  Moving slowly or fidgety/restless 0 0 -  Suicidal thoughts 0 0 -  PHQ-9 Score 4 4 -   GAD 7 : Generalized Anxiety Score 02/02/2017 01/23/2017  Nervous, Anxious, on Edge 1 1  Control/stop worrying 0 0  Worry too much - different things 0 1  Trouble relaxing 0 1  Restless 0 1  Easily annoyed or irritable 1 1  Afraid - awful might happen 1 0  Total GAD 7 Score 3 5

## 2017-03-15 ENCOUNTER — Other Ambulatory Visit: Payer: Self-pay | Admitting: General Practice

## 2017-03-15 ENCOUNTER — Ambulatory Visit: Payer: Self-pay | Admitting: Obstetrics and Gynecology

## 2017-03-15 ENCOUNTER — Other Ambulatory Visit: Payer: Self-pay

## 2017-03-15 DIAGNOSIS — O24439 Gestational diabetes mellitus in the puerperium, unspecified control: Secondary | ICD-10-CM

## 2017-06-13 ENCOUNTER — Encounter (HOSPITAL_COMMUNITY): Payer: Self-pay | Admitting: Emergency Medicine

## 2017-06-13 ENCOUNTER — Ambulatory Visit (HOSPITAL_COMMUNITY)
Admission: EM | Admit: 2017-06-13 | Discharge: 2017-06-13 | Disposition: A | Discharge status: Home / Self Care | Payer: Medicaid Other | Attending: Physician Assistant | Admitting: Physician Assistant

## 2017-06-13 DIAGNOSIS — L0501 Pilonidal cyst with abscess: Secondary | ICD-10-CM

## 2017-06-13 MED ORDER — AMOXICILLIN-POT CLAVULANATE 875-125 MG PO TABS: 1.0000 | ORAL_TABLET | Freq: Two times a day (BID) | ORAL | 0 refills | Status: DC

## 2017-06-13 NOTE — ED Provider Notes (Signed)
06/13/2017 10:46 AM   DOB: 04-24-1980 / MRN: [id number]  SUBJECTIVE:  Alexis Avery is a 37 y.o. female presenting for left-sided gluteal pain which is worsening.  Patient has had this before and refuses incision and drainage at this time.  She denies fever, chills, nausea.  She has No Known Allergies.   She  has a past medical history of Anxiety, Depression, Gestational diabetes mellitus, antepartum, Heroin abuse (HCC), Left genital labial abscess (12/2015), MRSA (methicillin resistant staph aureus) culture positive, Nausea and vomiting in pregnancy (10/24/2012), Nicotine addiction (10/24/2012), and Preterm labor.    She  reports that she has been smoking cigarettes.  She has been smoking about 0.50 packs per day. She has never used smokeless tobacco. She reports that she does not drink alcohol or use drugs. She  reports that she currently engages in sexual activity. She reports using the following method of birth control/protection: None. The patient  has a past surgical history that includes Cholecystectomy and Tonsillectomy.  Her family history includes Cancer in her maternal grandmother; Heart attack in her father and maternal uncle; Hypertension in her maternal uncle and mother.  ROS per HPI  OBJECTIVE:  BP 121/89 (BP Location: Left Arm)   Pulse 84   Temp 98.2 F (36.8 C) (Oral)   Resp 18   SpO2 100%   Wt Readings from Last 3 Encounters:  02/20/17 106 lb (48.1 kg)  02/07/17 119 lb 8 oz (54.2 kg)  02/02/17 118 lb 8 oz (53.8 kg)   Temp Readings from Last 3 Encounters:  06/13/17 98.2 F (36.8 C) (Oral)  02/09/17 (!) 97.4 F (36.3 C) (Oral)  01/23/17 99 F (37.2 C) (Oral)   BP Readings from Last 3 Encounters:  06/13/17 121/89  02/20/17 130/90  02/09/17 129/84   Pulse Readings from Last 3 Encounters:  06/13/17 84  02/20/17 85  02/09/17 90    Physical Exam  Constitutional: She is oriented to person, place, and time. She appears well-nourished. No distress.  Eyes:  Pupils are equal, round, and reactive to light. EOM are normal.  Cardiovascular: Normal rate.  Pulmonary/Chest: Effort normal.  Abdominal: She exhibits no distension.  Musculoskeletal:  Left-sided pilonidal abscess with induration and fluctuance.  Negative for surrounding cellulitis.  Neurological: She is alert and oriented to person, place, and time. No cranial nerve deficit. Gait normal.  Skin: Skin is dry. She is not diaphoretic.  Psychiatric: She has a normal mood and affect.  Vitals reviewed.   No results found for this or any previous visit (from the past 72 hour(s)).  No results found.  ASSESSMENT AND PLAN:   Pilonidal abscess: Patient is refusing incision and drainage at this time.  Starting Augmentin.    Discharge Instructions     Hopefully the antibiotics will obviate the need for incision and drainage.  Incision and drainage is the definitive treatment however and if you feel that you are getting worse please make sure you come back so we can drain the abscess.        The patient is advised to call or return to clinic if she does not see an improvement in symptoms, or to seek the care of the closest emergency department if she worsens with the above plan.   Deliah BostonMichael Gisela Lea, MHS, PA-C 06/13/2017 10:46 AM   Ofilia Neaslark, Jonluke Cobbins L, PA-C 06/13/17 1046

## 2017-06-13 NOTE — Discharge Instructions (Addendum)
Hopefully the antibiotics will obviate the need for incision and drainage.  Incision and drainage is the definitive treatment however and if you feel that you are getting worse please make sure you come back so we can drain the abscess.

## 2017-06-13 NOTE — ED Triage Notes (Signed)
Pt sts abscess to lower back with hx of same

## 2018-06-28 IMAGING — US US MFM OB COMP +14 WKS
1 series · 13 of 28 positions shown · non-contrast
Comparison: none

1  ANACLETO PROFIT             [id number]      [id number]     [id number]
Indications

32 weeks gestation of pregnancy
Abdominal pain in pregnancy
Gestational hypertension without significant
proteinuria, third trimester
Drug use complicating pregnancy, third
trimester
Fetal Evaluation
Num Of Fetuses:     1
Preg. Location:     Intrauterine
Fetal Heart         129
Rate(bpm):
Cardiac Activity:   Observed
Presentation:       Breech
Placenta:           Anterior, above cervical os
P. Cord Insertion:  Visualized
Amniotic Fluid
AFI FV:      Subjectively within normal limits
AFI Sum(cm)     %Tile       Largest Pocket(cm)
15.88           57
RUQ(cm)       RLQ(cm)       LUQ(cm)        LLQ(cm)
4.63
Biometry
BPD:      82.1  mm     G. Age:  33w 0d         56  %    CI:        72.66   %    70 - 86
FL/HC:      19.7   %    19.9 -
HC:      306.3  mm     G. Age:  34w 1d         54  %    HC/AC:      1.00        0.96 -
AC:      307.2  mm     G. Age:  34w 5d         94  %    FL/BPD:     73.4   %    71 - 87
FL:       60.3  mm     G. Age:  31w 2d         12  %    FL/AC:      19.6   %    20 - 24
CER:      43.1  mm     G. Age:  37w 1d       > 95  %
Est. FW:    1143  gm    4 lb 14 oz      76  %
Gestational Age
LMP:           32w 4d        Date:  05/24/16                 EDD:   02/28/17
U/S Today:     33w 2d                                        EDD:   02/23/17
Best:          32w 4d     Det. By:  LMP  (05/24/16)          EDD:   02/28/17
Anatomy
Cranium:               Appears normal         LVOT:                   Not well visualized
Cavum:                 Appears normal         Aortic Arch:            Not well visualized
Ventricles:            Appears normal         Ductal Arch:            Not well visualized
Choroid Plexus:        Not well visualized    Diaphragm:              Appears normal
Cerebellum:            Appears normal         Stomach:                Appears normal, left
sided
Posterior Fossa:       Appears normal         Abdomen:                Appears normal
Nuchal Fold:           Not applicable (>20    Abdominal Wall:         Not well visualized
wks GA)
Face:                  Not well visualized    Cord Vessels:           Appears normal (3
vessel cord)
Lips:                  Appears normal         Kidneys:                Appear normal
Palate:                Not well visualized    Bladder:                Appears normal
Thoracic:              Appears normal         Spine:                  Not well visualized
Heart:                 Appears normal         Upper Extremities:      Not well visualized
(4CH, axis, and situs
RVOT:                  Not well visualized    Lower Extremities:      Appears normal;
ankles nws
Other:  Male gender. Technically difficult due to advanced GA and fetal
position.
Cervix Uterus Adnexa
Cervix
Length:            3.7  cm.
Impression
INDICATION: 36 yr old 2LMOOOG at 24w0d with gestational
hypertension for fetal ultrasound. Remote read.

[Series 1: us mfm ob comp +14 wks · 13 of 41 slices shown]
[im 2/41]
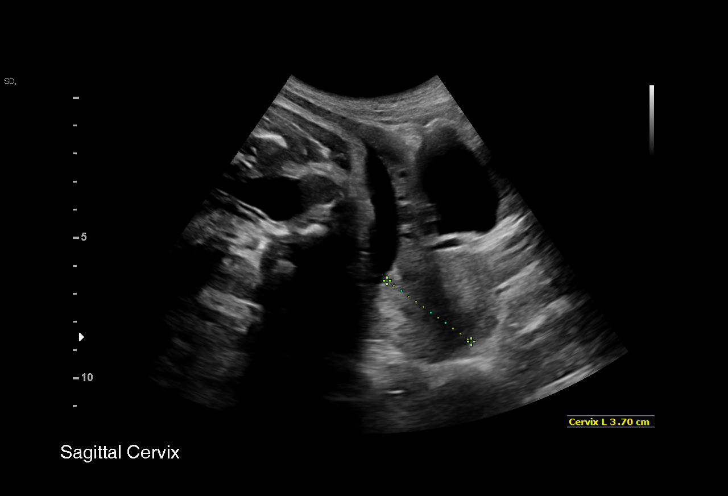
[im 5/41]
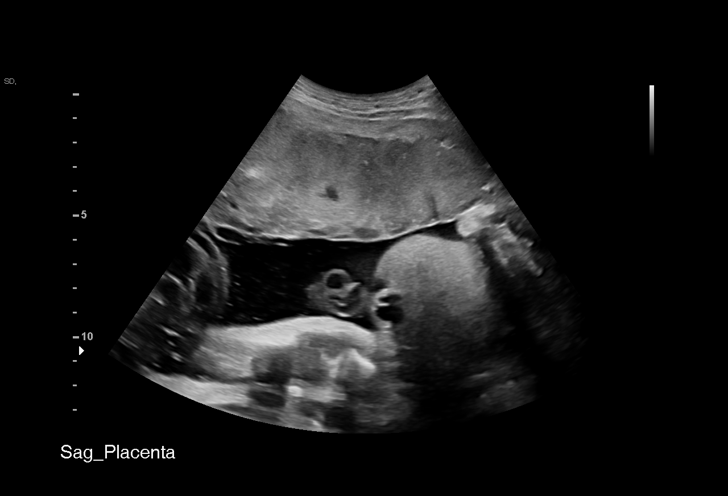
[im 8/41]
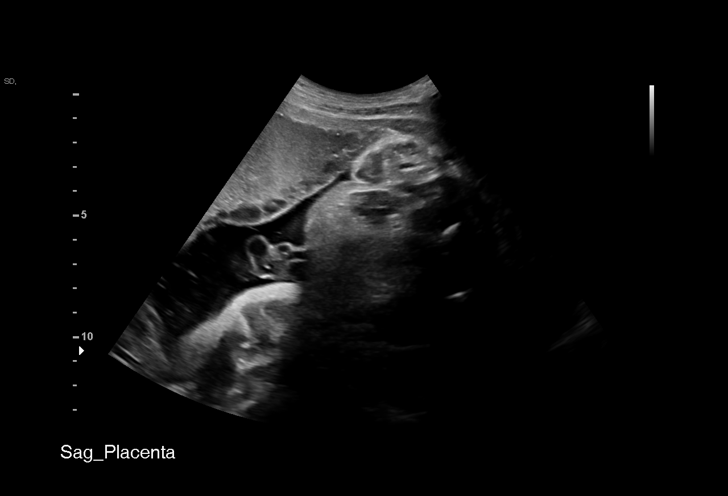
[im 11/41]
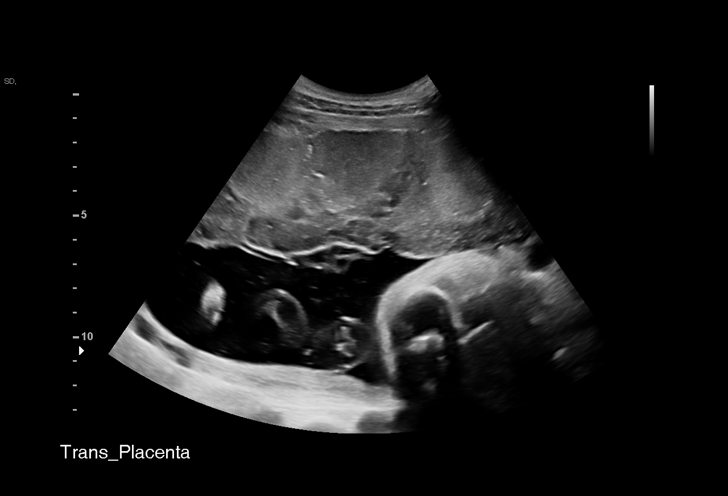
[im 14/41]
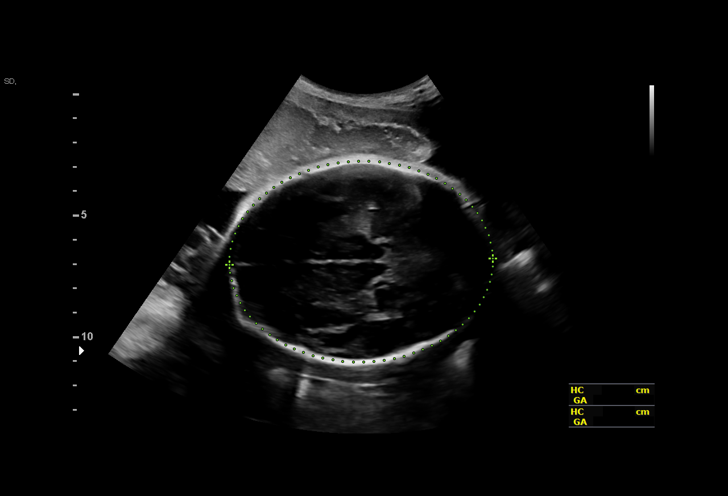
[im 17/41]
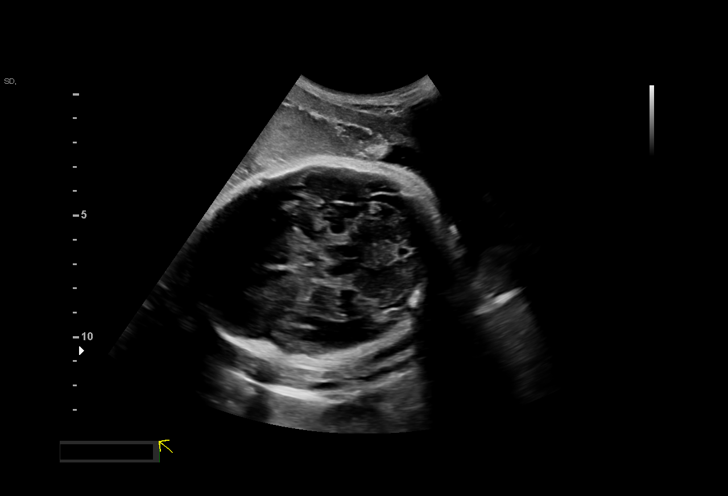
[im 21/41]
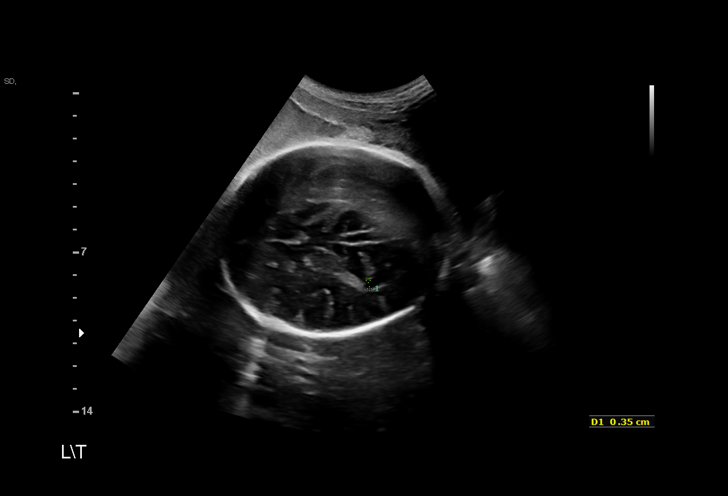
[im 24/41]
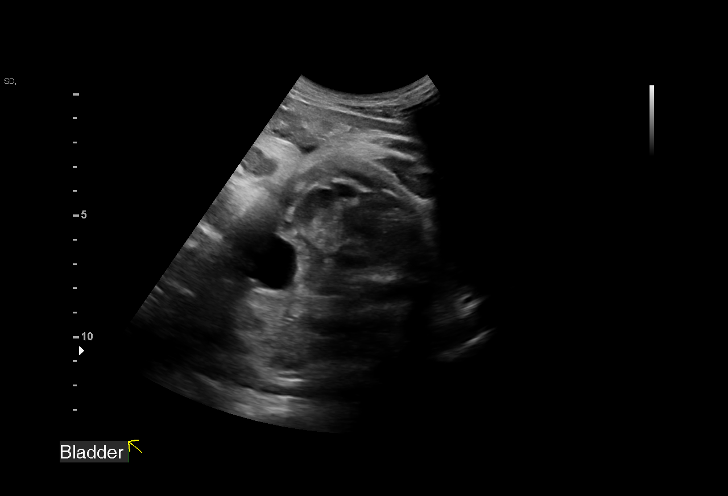
[im 27/41]
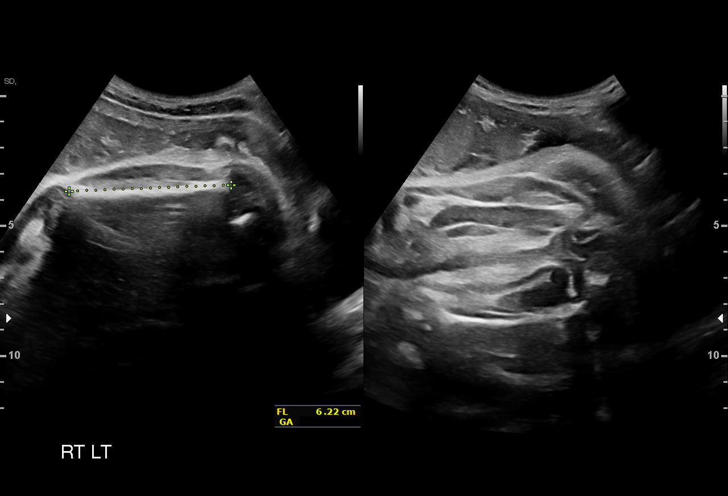
[im 30/41]
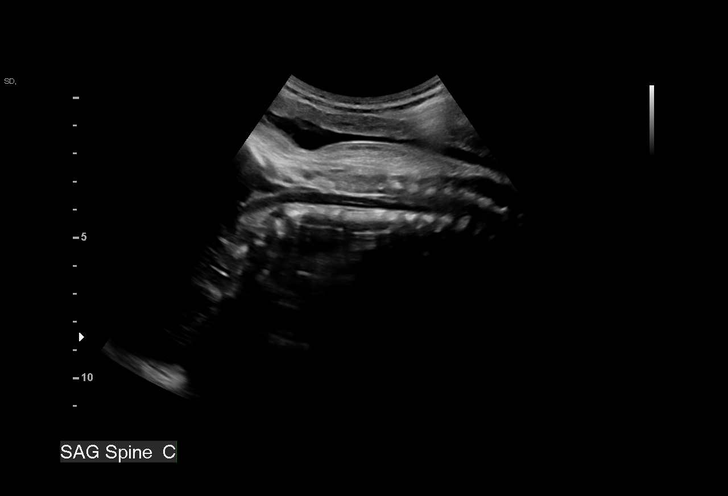
[im 33/41]
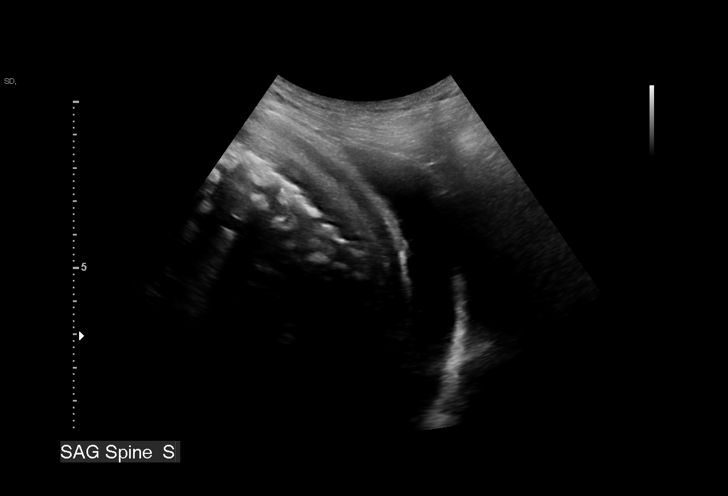
[im 36/41]
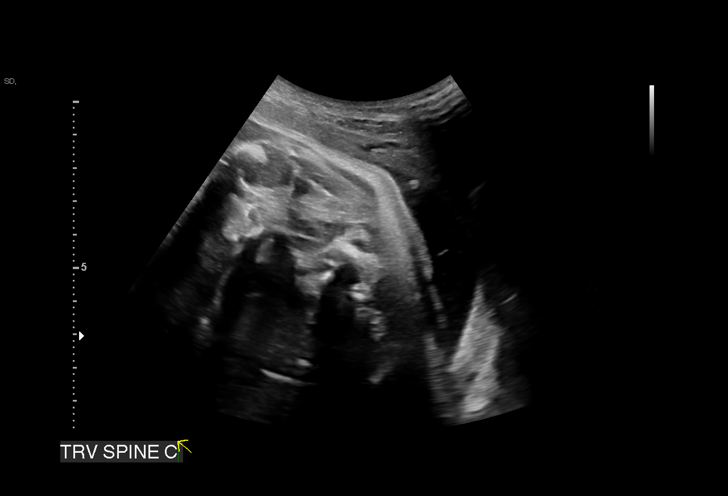
[im 39/41]
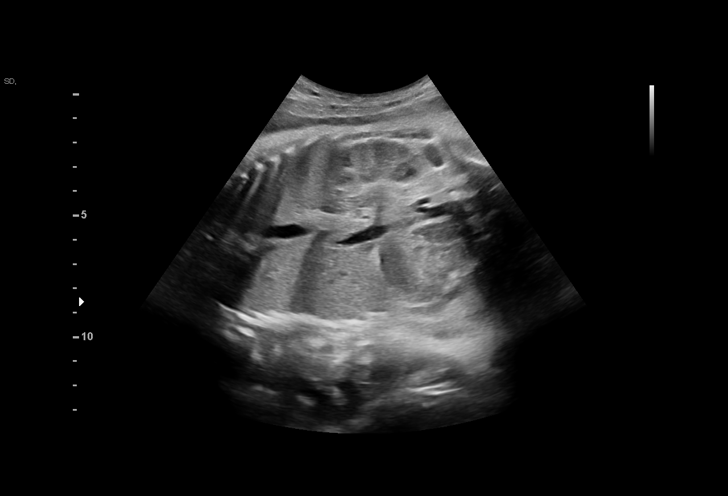

[13 of 28 positions shown; findings below may reference images not displayed]

FINDINGS: 1. Single intrauterine pregnancy.
2. Estimated fetal weight is in the 76th%.
3. Anterior placenta without evidence of previa.
4. Normal amniotic fluid index.
5. Normal transabdominal cervical length.
6. The anatomy survey is limited as above; no abnormalities
seen on limited exam.
Recommendations

1. Appropriate fetal growth.
2. Normal limited anatomy survey.
3. Gestational hypertension:
- management pre primary OB
- recommend serial fetal growth
- recommend antenatal testing
- recommend weekly AST, ALT, CBC, BUN, creatinine
- recommend delivery at 37 weeks or sooner if clinically
indicated
- recommend close surveillance for the development of
signs/symptoms of severe gestational
hypertension/preeclampsia

## 2018-12-03 DIAGNOSIS — F1190 Opioid use, unspecified, uncomplicated: Secondary | ICD-10-CM | POA: Insufficient documentation

## 2018-12-03 DIAGNOSIS — F1510 Other stimulant abuse, uncomplicated: Secondary | ICD-10-CM | POA: Insufficient documentation

## 2019-01-11 NOTE — L&D Delivery Note (Signed)
Delivery Note At 4:01 AM a viable female was delivered via Vaginal, Spontaneous (Presentation: ROA).  APGAR: 9, 9; weight pending.   Placenta status: Spontaneous, Intact.  Cord: 3 vessels with the following complications: None.  Cord pH: N/A  Anesthesia: None Episiotomy: None Lacerations: None Suture Repair: N/A Est. Blood Loss (mL):   Mom to postpartum.  Baby to Couplet care / Skin to Skin.  Vena Austria 03/30/2019, 5:06 AM

## 2019-02-28 ENCOUNTER — Encounter: Payer: Medicaid Other | Admitting: Advanced Practice Midwife

## 2019-03-14 ENCOUNTER — Ambulatory Visit (INDEPENDENT_AMBULATORY_CARE_PROVIDER_SITE_OTHER): Payer: Medicaid Other | Admitting: Advanced Practice Midwife

## 2019-03-14 ENCOUNTER — Encounter: Payer: Self-pay | Admitting: Advanced Practice Midwife

## 2019-03-14 ENCOUNTER — Other Ambulatory Visit: Payer: Self-pay

## 2019-03-14 ENCOUNTER — Other Ambulatory Visit (HOSPITAL_COMMUNITY)
Admission: RE | Admit: 2019-03-14 | Discharge: 2019-03-14 | Disposition: A | Discharge status: Home / Self Care | Payer: Medicaid Other | Source: Ambulatory Visit | Attending: Advanced Practice Midwife | Admitting: Advanced Practice Midwife

## 2019-03-14 VITALS — BP 100/70 | Wt 111.0 lb

## 2019-03-14 DIAGNOSIS — Z113 Encounter for screening for infections with a predominantly sexual mode of transmission: Secondary | ICD-10-CM | POA: Insufficient documentation

## 2019-03-14 DIAGNOSIS — O09292 Supervision of pregnancy with other poor reproductive or obstetric history, second trimester: Secondary | ICD-10-CM

## 2019-03-14 DIAGNOSIS — O09892 Supervision of other high risk pregnancies, second trimester: Secondary | ICD-10-CM | POA: Diagnosis not present

## 2019-03-14 DIAGNOSIS — Z124 Encounter for screening for malignant neoplasm of cervix: Secondary | ICD-10-CM

## 2019-03-14 DIAGNOSIS — O0990 Supervision of high risk pregnancy, unspecified, unspecified trimester: Secondary | ICD-10-CM

## 2019-03-14 DIAGNOSIS — Z3A22 22 weeks gestation of pregnancy: Secondary | ICD-10-CM

## 2019-03-14 DIAGNOSIS — O0932 Supervision of pregnancy with insufficient antenatal care, second trimester: Secondary | ICD-10-CM

## 2019-03-14 DIAGNOSIS — O09522 Supervision of elderly multigravida, second trimester: Secondary | ICD-10-CM | POA: Diagnosis not present

## 2019-03-14 DIAGNOSIS — F1123 Opioid dependence with withdrawal: Secondary | ICD-10-CM

## 2019-03-14 DIAGNOSIS — Z8632 Personal history of gestational diabetes: Secondary | ICD-10-CM

## 2019-03-14 DIAGNOSIS — O09299 Supervision of pregnancy with other poor reproductive or obstetric history, unspecified trimester: Secondary | ICD-10-CM | POA: Diagnosis not present

## 2019-03-14 DIAGNOSIS — O0993 Supervision of high risk pregnancy, unspecified, third trimester: Secondary | ICD-10-CM

## 2019-03-14 LAB — POCT URINALYSIS DIPSTICK OB
Glucose, UA: NEGATIVE
POC,PROTEIN,UA: NEGATIVE

## 2019-03-14 NOTE — Progress Notes (Signed)
New Obstetric Patient H&P    Chief Complaint: "Desires prenatal care"   History of Present Illness: Patient is a 39 y.o. [phone] Not Hispanic or Latino female, presents with amenorrhea and positive home pregnancy test. Patient's last menstrual period was 10/07/2018. and based on her  LMP, her EDD is Estimated Date of Delivery: 07/14/19 and her EGA is [redacted]w[redacted]d. Cycles are 4. days, regular, and occur approximately every : 28 days. Her last pap smear was 6 or 7 years ago and was NIL with + HPV (not HR).    She had a urine pregnancy test which was positive 4 month(s)  ago. Her last menstrual period lasted for  3 or 4 day(s). She is uncertain if it was a normal period. Since her LMP she claims she has experienced breast tenderness, fatigue, nausea. She denies vaginal bleeding. Her past medical history is contributory for opioid dependence. She is receiving withdrawal treatment with Dr Neta Mends of [facility]. Her prior pregnancies are notable for G1 SAB, G2 PT SVD Female 5#7oz 2011, oligo, G3 FT SVD Female 6#6oz 50, G16 FT VAVD Female 6#7oz 2019 GDM/GHTN, G5 current late prenatal care  Since her LMP, she admits to the use of tobacco products  Yes about 1/2 PPD She claims she has gained 11 pounds since the start of her pregnancy.  There are cats in the home in the home  no  She admits close contact with children on a regular basis  yes  She has had chicken pox in the past yes She has had Tuberculosis exposures, symptoms, or previously tested positive for TB   no Current or past history of domestic violence. no  Genetic Screening/Teratology Counseling: (Includes patient, baby's father, or anyone in either family with:)   41. Patient's age >/= 50 at [facility]  Yes 2. Thalassemia ([place], [place], [place], or Asian background): MCV<80  no 3. Neural tube defect (meningomyelocele, spina bifida, anencephaly)  no 4. Congenital heart defect  no  5. Down syndrome  no 6. Tay-Sachs (Jewish, [place])   no 7. Canavan's Disease  no 8. Sickle cell disease or trait (African)  no  9. Hemophilia or other blood disorders  no  10. Muscular dystrophy  no  11. Cystic fibrosis  no  12. Huntington's Chorea  no  13. Mental retardation/autism  no 14. Other inherited genetic or chromosomal disorder  no 15. Maternal metabolic disorder (DM, PKU, etc)  no 16. Patient or FOB with a child with a birth defect not listed above no  16a. Patient or FOB with a birth defect themselves no 17. Recurrent pregnancy loss, or stillbirth  no  18. Any medications since LMP other than prenatal vitamins (include vitamins, supplements, OTC meds, drugs, alcohol)  Subutex. Admitted use of heroin and methamphetamine prior to starting subutex in November 19. Any other genetic/environmental exposure to discuss  no  Infection History:   1. Lives with someone with TB or TB exposed  no  2. Patient or partner has history of genital herpes  no 3. Rash or viral illness since LMP  no 4. History of STI (GC, CT, HPV, syphilis, HIV)  no 5. History of recent travel :  no  Other pertinent information:  Had planned interval tubal after last pregnancy    Review of Systems:10 point review of systems negative unless otherwise noted in HPI  Past Medical History:  Patient Active Problem List   Diagnosis Date Noted  . Supervision of high risk pregnancy, antepartum 03/14/2019  .  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 Opioid use disorder (HCC) 12/03/2018  . Cervical high risk human papillomavirus (HPV) DNA test positive 01/23/2017    12/2012 Needs repeat pap post partum   . Supervision of high risk pregnancy, antepartum, third trimester 01/07/2017     Clinic [facility] Prenatal [facility]  Dating  Blood type:     Genetic Screen 1 Screen:    AFP:     Quad:     NIPS: too late w PNC Antibody:   Anatomic [place]  Rubella:   Varicella: @VZVIGG @  GTT Third Tri: Hgb A1C RPR:     Rhogam  HBsAg:     Vaccines TDAP:                       Flu Shot:  per pt had vacc HIV:     Baby Food May consider breast              GBS:   Contraception  Pap: 03/14/19  CBB     CS/VBAC NA   Support Person Husband 05/14/19       . MRSA (methicillin resistant staph aureus) culture positive   . Opiate dependence (HCC) 01/21/2014  . History of preterm delivery, currently pregnant 01/16/2013    @ 36wks, IOL d/t oligo   . Drug use complicating pregnancy 01/16/2013    12/2016: +THC  10/24/12: UDS + benzo, amphetamines, and barbituates          Counseled 12/05/12      Repeat 01/16/13 @ 26wks: neg for all   . Nicotine addiction 10/24/2012  . Anxiety 10/24/2012    Past Surgical History:  Past Surgical History:  Procedure Laterality Date  . CHOLECYSTECTOMY    . TONSILLECTOMY      Gynecologic History: Patient's last menstrual period was 10/07/2018.  Obstetric History09/29/2020  Family History:  Family History  Problem Relation Age of Onset  . Hypertension Mother   . Cancer Maternal Grandmother        breast  . Hypertension Maternal Uncle   . Heart attack Maternal Uncle   . Heart attack Father     Social History:  Social History   Socioeconomic History  . Marital status: Married    Spouse name: Not on file  . Number of children: Not on file  . Years of education: Not on file  . Highest education level: Not on file  Occupational History  . Not on file  Tobacco Use  . Smoking status: Current Every Day Smoker    Packs/day: 0.50    Types: Cigarettes  . Smokeless tobacco: Never Used  Substance and Sexual Activity  . Alcohol use: No  . Drug use: No    Comment: not used heroin since Feb 2018  . Sexual activity: Yes    Birth control/protection: None  Other Topics Concern  . Not on file  Social History Narrative  . Not on file   Social Determinants of Health   Financial Resource Strain:   . Difficulty of Paying Living Expenses: Not on file  Food Insecurity:   . Worried About Mar 2018 in the Last Year: Not on file  . Ran  Out of Food in the Last Year: Not on file  Transportation Needs:   . Lack of Transportation (Medical): Not on file  . Lack of Transportation (Non-Medical): Not on file  Physical Activity:   . Days of Exercise per Week: Not on file  . Minutes of  Exercise per Session: Not on file  Stress:   . Feeling of Stress : Not on file  Social Connections:   . Frequency of Communication with Friends and Family: Not on file  . Frequency of Social Gatherings with Friends and Family: Not on file  . Attends Religious Services: Not on file  . Active Member of Clubs or Organizations: Not on file  . Attends Banker Meetings: Not on file  . Marital Status: Not on file  Intimate Partner Violence:   . Fear of Current or Ex-Partner: Not on file  . Emotionally Abused: Not on file  . Physically Abused: Not on file  . Sexually Abused: Not on file    Allergies:  No Known Allergies  Medications: Prior to Admission medications   Medication Sig Start Date End Date Taking? Authorizing Provider  buprenorphine (SUBUTEX) 2 MG SUBL SL tablet Place under the tongue daily.   Yes [provider]  cetirizine (ZYRTEC) 10 MG tablet Take 10 mg by mouth daily.    [provider]  Prenatal Vit-Fe Fumarate-FA (PRENATAL VITAMIN) 27-0.8 MG TABS Take by mouth.    [provider]    Physical Exam Vitals: Blood pressure 100/70, weight 111 lb (50.3 kg), last menstrual period 10/07/2018.  General: NAD HEENT: normocephalic, anicteric Thyroid: no enlargement, no palpable nodules Pulmonary: No increased work of breathing, CTAB Cardiovascular: RRR, distal pulses 2+ Abdomen: NABS, soft, non-tender, non-distended.  Umbilicus without lesions.  No hepatomegaly, splenomegaly or masses palpable. No evidence of hernia. FHTs 130s, FH 33 cm  Genitourinary:  External: Normal external female genitalia.  Normal urethral meatus, normal  Bartholin's and Skene's glands.    Vagina: Normal vaginal mucosa,  no evidence of prolapse.    Cervix: Grossly normal in appearance, no bleeding, no CMT  Uterus: Enlarged, mobile, normal contour.    Adnexa: ovaries non-enlarged, no adnexal masses  Rectal: deferred Extremities: no edema, erythema, or tenderness Neurologic: Grossly intact Psychiatric: mood appropriate, affect full   Assessment: 39 y.o. [id number] at [redacted]w[redacted]d by uncertain LMP presenting to initiate prenatal care  Plan: 1) Avoid alcoholic beverages. 2) Patient encouraged not to smoke.  3) Discontinue the use of all non-medicinal drugs and chemicals.  4) Take prenatal vitamins daily.  5) Nutrition, food safety (fish, cheese advisories, and high nitrite foods) and exercise discussed. 6) Hospital and practice style discussed with cross coverage system.  7) Genetic Screening, such as with 1st Trimester Screening, cell free fetal DNA, AFP testing, and Ultrasound, as well as with amniocentesis and CVS as appropriate, is discussed with patient. At the conclusion of today's visit patient declined genetic testing 8) Patient is asked about travel to areas at risk for the [place] virus, and counseled to avoid travel and exposure to mosquitoes or sexual partners who may have themselves been exposed to the virus. Testing is discussed, and will be ordered as appropriate.  9) PAPtima, urine culture, NOB panel, CMP, UPC, Hgb A1C today 10) Return to clinic in 1 week for dating and rob 80) Needs to sign tubal consent if BTL is desired   Tresea Mall, CNM [facility] 03/14/2019, 3:05 PM

## 2019-03-14 NOTE — Patient Instructions (Signed)
Perinatal Anxiety When a woman feels excessive tension or worry (anxiety) during pregnancy or during the first 12 months after she gives birth, she has a condition called perinatal anxiety. Anxiety can interfere with work, school, relationships, and other everyday activities. If it is not managed properly, it can also cause problems in the mother and her baby.  If you are pregnant and you have symptoms of an anxiety disorder, it is important to talk with your health care provider. What are the causes? The exact cause of this condition is not known. Hormonal changes during and after pregnancy may play a role in causing perinatal anxiety. What increases the risk? You are more likely to develop this condition if:  You have a personal or family history of depression, anxiety, or mood disorders.  You experience a stressful life event during pregnancy, such as the death of a loved one.  You have a lot of regular life stress, such as being a single parent.  You have thyroid problems. What are the signs or symptoms? Perinatal anxiety can be different for everyone. It may include:  Panic attacks (panic disorder). These are intense episodes of fear or discomfort that may also cause sweating, nausea, shortness of breath, or fear of dying. They usually last 5-15 minutes.  Reliving an upsetting (traumatic) event through distressing thoughts, dreams, or flashbacks (post-traumatic stress disorder, or PTSD).  Excessive worry about multiple problems (generalized anxiety disorder).  Fear and stress about leaving certain people or loved ones (separation anxiety).  Performing repetitive tasks (compulsions) to relieve stress or worry (obsessive compulsive disorder, or OCD).  Fear of certain objects or situations (phobias).  Excessive worrying, such as a constant feeling that something bad is going to happen.  Inability to relax.  Difficulty concentrating.  Sleep problems.  Frequent nightmares or  disturbing thoughts. How is this diagnosed? This condition is diagnosed based on a physical exam and mental evaluation. In some cases, your health care provider may use an anxiety screening tool. These tools include a list of questions that can help a health care provider diagnose anxiety. Your health care provider may refer you to a mental health expert who specializes in anxiety. How is this treated? This condition may be treated with:  Medicines. Your health care provider will only give you medicines that have been proven safe for pregnancy and breastfeeding.  Talk therapy with a mental health professional to help change your patterns of thinking (cognitive behavioral therapy).  Mindfulness-based stress reduction.  Other relaxation therapies, such as deep breathing or guided muscle relaxation.  Support groups. Follow these instructions at home: Lifestyle  Do not use any products that contain nicotine or tobacco, such as cigarettes and e-cigarettes. If you need help quitting, ask your health care provider.  Do not use alcohol when you are pregnant. After your baby is born, limit alcohol intake to no more than 1 drink a day. One drink equals 12 oz of beer, 5 oz of wine, or 1 oz of hard liquor.  Consider joining a support group for new mothers. Ask your health care provider for recommendations.  Take good care of yourself. Make sure you: ? Get plenty of sleep. If you are having trouble sleeping, talk with your health care provider. ? Eat a healthy diet. This includes plenty of fruits and vegetables, whole grains, and lean proteins. ? Exercise regularly, as told by your health care provider. Ask your health care provider what exercises are safe for you. General instructions  Take over-the-counter  and prescription medicines only as told by your health care provider.  Talk with your partner or family members about your feelings during pregnancy. Share any concerns or fears that you may  have.  Ask for help with tasks or chores when you need it. Ask friends and family members to provide meals, watch your children, or help with cleaning.  Keep all follow-up visits as told by your health care provider. This is important. Contact a health care provider if:  You (or people close to you) notice that you have any symptoms of anxiety or depression.  You have anxiety and your symptoms get worse.  You experience side effects from medicines, such as nausea or sleep problems. Get help right away if:  You feel like hurting yourself, your baby, or someone else. If you ever feel like you may hurt yourself or others, or have thoughts about taking your own life, get help right away. You can go to your nearest emergency department or call:  Your local emergency services (911 in the [place]  A suicide crisis helpline, such as the [place] at 9843143769. This is open 24 hours a day. Summary  Perinatal anxiety is when a woman feels excessive tension or worry during pregnancy or during the first 12 months after she gives birth.  Perinatal anxiety may include panic attacks, post-traumatic stress disorder, separation anxiety, phobias, or generalized anxiety.  Perinatal anxiety can cause physical health problems in the mother and baby if not properly managed.  This condition is treated with medicines, talk therapy, stress reduction therapies, or a combination of two or more treatments.  Talk with your partner or family members about your concerns or fears. Do not be afraid to ask for help. This information is not intended to replace advice given to you by your health care provider. Make sure you discuss any questions you have with your health care provider. Document Revised: 12/30/2016 Document Reviewed: 02/24/2016 Elsevier Patient Education  [place]. Exercise During Pregnancy Exercise is an important part of being healthy for people of all ages.  Exercise improves the function of your heart and lungs and helps you maintain strength, flexibility, and a healthy body weight. Exercise also boosts energy levels and elevates mood. Most women should exercise regularly during pregnancy. In rare cases, women with certain medical conditions or complications may be asked to limit or avoid exercise during pregnancy. How does this affect me? Along with maintaining general strength and flexibility, exercising during pregnancy can help:  Keep strength in muscles that are used during labor and childbirth.  Decrease low back pain.  Reduce symptoms of depression.  Control weight gain during pregnancy.  Reduce the risk of needing insulin if you develop diabetes during pregnancy.  Decrease the risk of cesarean delivery.  Speed up your recovery after giving birth. How does this affect my baby? Exercise can help you have a healthy pregnancy. Exercise does not cause premature birth. It will not cause your baby to weigh less at birth. What exercises can I do? Many exercises are safe for you to do during pregnancy. Do a variety of exercises that safely increase your heart and breathing rates and help you build and maintain muscle strength. Do exercises exactly as told by your health care provider. You may do these exercises:  Walking or hiking.  Swimming.  Water aerobics.  Riding a stationary bike.  Strength training.  Modified yoga or Pilates. Tell your instructor that you are pregnant. Avoid overstretching, and  avoid lying on your back for long periods of time.  Running or jogging. Only choose this type of exercise if you: ? Ran or jogged regularly before your pregnancy. ? Can run or jog and still talk in complete sentences. What exercises should I avoid? Depending on your level of fitness and whether you exercised regularly before your pregnancy, you may be told to limit high-intensity exercise. You can tell that you are exercising at a high  intensity if you are breathing much harder and faster and cannot hold a conversation while exercising. You must avoid:  Contact sports.  Activities that put you at risk for falling on or being hit in the belly, such as downhill skiing, water skiing, surfing, rock climbing, cycling, gymnastics, and horseback riding.  Scuba diving.  Skydiving.  Yoga or Pilates in a room that is heated to high temperatures.  Jogging or running, unless you ran or jogged regularly before your pregnancy. While jogging or running, you should always be able to talk in full sentences. Do not run or jog so fast that you are unable to have a conversation.  Do not exercise at more than 6,000 feet above sea level (high elevation) if you are not used to exercising at high elevation. How do I exercise in a safe way?   Avoid overheating. Do not exercise in very high temperatures.  Wear loose-fitting, breathable clothes.  Avoid dehydration. Drink enough water before, during, and after exercise to keep your urine pale yellow.  Avoid overstretching. Because of hormone changes during pregnancy, it is easy to overstretch muscles, tendons, and ligaments during pregnancy.  Start slowly and ask your health care provider to recommend the types of exercise that are safe for you.  Do not exercise to lose weight. Follow these instructions at home:  Exercise on most days or all days of the week. Try to exercise for 30 minutes a day, 5 days a week, unless your health care provider tells you not to.  If you actively exercised before your pregnancy and you are healthy, your health care provider may tell you to continue to do moderate to high-intensity exercise.  If you are just starting to exercise or did not exercise much before your pregnancy, your health care provider may tell you to do low to moderate-intensity exercise. Questions to ask your health care provider  Is exercise safe for me?  What are signs that I should  stop exercising?  Does my health condition mean that I should not exercise during pregnancy?  When should I avoid exercising during pregnancy? Stop exercising and contact a health care provider if: You have any unusual symptoms, such as:  Mild contractions of the uterus or cramps in the abdomen.  Dizziness that does not go away when you rest. Stop exercising and get help right away if: You have any unusual symptoms, such as:  Sudden, severe pain in your low back or your belly.  Mild contractions of the uterus or cramps in the abdomen that do not improve with rest and drinking fluids.  Chest pain.  Bleeding or fluid leaking from your vagina.  Shortness of breath. These symptoms may represent a serious problem that is an emergency. Do not wait to see if the symptoms will go away. Get medical help right away. Call your local emergency services (911 in the [place] Do not drive yourself to the hospital. Summary  Most women should exercise regularly throughout pregnancy. In rare cases, women with certain medical conditions or complications  may be asked to limit or avoid exercise during pregnancy.  Do not exercise to lose weight during pregnancy.  Your health care provider will tell you what level of physical activity is right for you.  Stop exercising and contact a health care provider if you have mild contractions of the uterus or cramps in the abdomen. Get help right away if these contractions or cramps do not improve with rest and drinking fluids.  Stop exercising and get help right away if you have sudden, severe pain in your low back or belly, chest pain, shortness of breath, or bleeding or leaking of fluid from your vagina. This information is not intended to replace advice given to you by your health care provider. Make sure you discuss any questions you have with your health care provider. Document Revised: 04/19/2018 Document Reviewed: 01/31/2018 Elsevier Patient Education   2020 [employer]. Eating Plan for Pregnant Women While you are pregnant, your body requires additional nutrition to help support your growing baby. You also have a higher need for some vitamins and minerals, such as folic acid, calcium, iron, and vitamin D. Eating a healthy, well-balanced diet is very important for your health and your baby's health. Your need for extra calories varies for the three 7-month segments of your pregnancy (trimesters). For most women, it is recommended to consume:  150 extra calories a day during the first trimester.  300 extra calories a day during the second trimester.  300 extra calories a day during the third trimester. What are tips for following this plan?   Do not try to lose weight or go on a diet during pregnancy.  Limit your overall intake of foods that have "empty calories." These are foods that have little nutritional value, such as sweets, desserts, candies, and sugar-sweetened beverages.  Eat a variety of foods (especially fruits and vegetables) to get a full range of vitamins and minerals.  Take a prenatal vitamin to help meet your additional vitamin and mineral needs during pregnancy, specifically for folic acid, iron, calcium, and vitamin D.  Remember to stay active. Ask your health care provider what types of exercise and activities are safe for you.  Practice good food safety and cleanliness. Wash your hands before you eat and after you prepare raw meat. Wash all fruits and vegetables well before peeling or eating. Taking these actions can help to prevent food-borne illnesses that can be very dangerous to your baby, such as listeriosis. Ask your health care provider for more information about listeriosis. What does 150 extra calories look like? Healthy options that provide 150 extra calories each day could be any of the following:  6-8 oz (170-230 g) of plain low-fat yogurt with  cup of berries.  1 apple with 2 teaspoons (11 g) of peanut  butter.  Cut-up vegetables with  cup (60 g) of hummus.  8 oz (230 mL) or 1 cup of low-fat chocolate milk.  1 stick of string cheese with 1 medium orange.  1 peanut butter and jelly sandwich that is made with one slice of whole-wheat bread and 1 tsp (5 g) of peanut butter. For 300 extra calories, you could eat two of those healthy options each day. What is a healthy amount of weight to gain? The right amount of weight gain for you is based on your BMI before you became pregnant. If your BMI:  Was less than 18 (underweight), you should gain 28-40 lb (13-18 kg).  Was 18-24.9 (normal), you should gain 25-35  lb (11-16 kg).  Was 25-29.9 (overweight), you should gain 15-25 lb (7-11 kg).  Was 30 or greater (obese), you should gain 11-20 lb (5-9 kg). What if I am having twins or multiples? Generally, if you are carrying twins or multiples:  You may need to eat 300-600 extra calories a day.  The recommended range for total weight gain is 25-54 lb (11-25 kg), depending on your BMI before pregnancy.  Talk with your health care provider to find out about nutritional needs, weight gain, and exercise that is right for you. What foods can I eat?  Fruits All fruits. Eat a variety of colors and types of fruit. Remember to wash your fruits well before peeling or eating. Vegetables All vegetables. Eat a variety of colors and types of vegetables. Remember to wash your vegetables well before peeling or eating. Grains All grains. Choose whole grains, such as whole-wheat bread, oatmeal, or brown rice. Meats and other protein foods Lean meats, including chicken, [place], fish, and lean cuts of beef, veal, or pork. If you eat fish or seafood, choose options that are higher in omega-3 fatty acids and lower in mercury, such as salmon, herring, mussels, trout, sardines, pollock, shrimp, crab, and lobster. Tofu. Tempeh. Beans. Eggs. Peanut butter and other nut butters. Make sure that all meats, poultry, and  eggs are cooked to food-safe temperatures or "well-done." Two or more servings of fish are recommended each week in order to get the most benefits from omega-3 fatty acids that are found in seafood. Choose fish that are lower in mercury. You can find more information online:  PumpkinSearch.com.ee Dairy Pasteurized milk and milk alternatives (such as almond milk). Pasteurized yogurt and pasteurized cheese. Cottage cheese. Sour cream. Beverages Water. Juices that contain 100% fruit juice or vegetable juice. Caffeine-free teas and decaffeinated coffee. Drinks that contain caffeine are okay to drink, but it is better to avoid caffeine. Keep your total caffeine intake to less than 200 mg each day (which is 12 oz or 355 mL of coffee, tea, or soda) or the limit as told by your health care provider. Fats and oils Fats and oils are okay to include in moderation. Sweets and desserts Sweets and desserts are okay to include in moderation. Seasoning and other foods All pasteurized condiments. The items listed above may not be a complete list of foods and beverages you can eat. Contact a dietitian for more information. What foods are not recommended? Fruits Unpasteurized fruit juices. Vegetables Raw (unpasteurized) vegetable juices. Meats and other protein foods Lunch meats, bologna, hot dogs, or other deli meats. (If you must eat those meats, reheat them until they are steaming hot.) Refrigerated pat, meat spreads from a meat counter, smoked seafood that is found in the refrigerated section of a store. Raw or undercooked meats, poultry, and eggs. Raw fish, such as sushi or sashimi. Fish that have high mercury content, such as tilefish, shark, swordfish, and king mackerel. To learn more about mercury in fish, talk with your health care provider or look for online resources, such as:  PumpkinSearch.com.ee Dairy Raw (unpasteurized) milk and any foods that have raw milk in them. Soft cheeses, such as feta, queso blanco,  queso fresco, Brie, Camembert cheeses, blue-veined cheeses, and Panela cheese (unless it is made with pasteurized milk, which must be stated on the label). Beverages Alcohol. Sugar-sweetened beverages, such as sodas, teas, or energy drinks. Seasoning and other foods Homemade fermented foods and drinks, such as pickles, sauerkraut, or kombucha drinks. (Store-bought pasteurized versions  of these are okay.) Salads that are made in a store or deli, such as ham salad, chicken salad, egg salad, tuna salad, and seafood salad. The items listed above may not be a complete list of foods and beverages you should avoid. Contact a dietitian for more information. Where to find more information To calculate the number of calories you need based on your height, weight, and activity level, you can use an online calculator such as:  www.choosemyplate.gov/MyPlatePlan To calculate how much weight you should gain during pregnancy, you can use an online pregnancy weight gain calculator such as:  www.choosemyplate.gov/pregnancy-weight-gain-calculator Summary  While you are pregnant, your body requires additional nutrition to help support your growing baby.  Eat a variety of foods, especially fruits and vegetables to get a full range of vitamins and minerals.  Practice good food safety and cleanliness. Wash your hands before you eat and after you prepare raw meat. Wash all fruits and vegetables well before peeling or eating. Taking these actions can help to prevent food-borne illnesses, such as listeriosis, that can be very dangerous to your baby.  Do not eat raw meat or fish. Do not eat fish that have high mercury content, such as tilefish, shark, swordfish, and king mackerel. Do not eat unpasteurized (raw) dairy.  Take a prenatal vitamin to help meet your additional vitamin and mineral needs during pregnancy, specifically for folic acid, iron, calcium, and vitamin D. This information is not intended to replace  advice given to you by your health care provider. Make sure you discuss any questions you have with your health care provider. Document Revised: 05/17/2018 Document Reviewed: 09/23/2016 Elsevier Patient Education  2020 [employer]. Prenatal Care Prenatal care is health care during pregnancy. It helps you and your unborn baby (fetus) stay as healthy as possible. Prenatal care may be provided by a midwife, a family practice health care provider, or a childbirth and pregnancy specialist (obstetrician). How does this affect me? During pregnancy, you will be closely monitored for any new conditions that might develop. To lower your risk of pregnancy complications, you and your health care provider will talk about any underlying conditions you have. How does this affect my baby? Early and consistent prenatal care increases the chance that your baby will be healthy during pregnancy. Prenatal care lowers the risk that your baby will be:  Born early (prematurely).  Smaller than expected at birth (small for gestational age). What can I expect at the first prenatal care visit? Your first prenatal care visit will likely be the longest. You should schedule your first prenatal care visit as soon as you know that you are pregnant. Your first visit is a good time to talk about any questions or concerns you have about pregnancy. At your visit, you and your health care provider will talk about:  Your medical history, including: ? Any past pregnancies. ? Your family's medical history. ? The baby's father's medical history. ? Any long-term (chronic) health conditions you have and how you manage them. ? Any surgeries or procedures you have had. ? Any current over-the-counter or prescription medicines, herbs, or supplements you are taking.  Other factors that could pose a risk to your baby, including:  Your home setting and your stress levels, including: ? Exposure to abuse or violence. ? Household financial  strain. ? Mental health conditions you have.  Your daily health habits, including diet and exercise. Your health care provider will also:  Measure your weight, height, and blood pressure.  Do   a physical exam, including a pelvic and breast exam.  Perform blood tests and urine tests to check for: ? Urinary tract infection. ? Sexually transmitted infections (STIs). ? Low iron levels in your blood (anemia). ? Blood type and certain proteins on red blood cells (Rh antibodies). ? Infections and immunity to viruses, such as hepatitis B and rubella. ? HIV (human immunodeficiency virus).  Do an ultrasound to confirm your baby's growth and development and to help predict your estimated due date (EDD). This ultrasound is done with a probe that is inserted into the vagina (transvaginal ultrasound).  Discuss your options for genetic screening.  Give you information about how to keep yourself and your baby healthy, including: ? Nutrition and taking vitamins. ? Physical activity. ? How to manage pregnancy symptoms such as nausea and vomiting (morning sickness). ? Infections and substances that may be harmful to your baby and how to avoid them. ? Food safety. ? Dental care. ? Working. ? Travel. ? Warning signs to watch for and when to call your health care provider. How often will I have prenatal care visits? After your first prenatal care visit, you will have regular visits throughout your pregnancy. The visit schedule is often as follows:  Up to week 28 of pregnancy: once every 4 weeks.  28-36 weeks: once every 2 weeks.  After 36 weeks: every week until delivery. Some women may have visits more or less often depending on any underlying health conditions and the health of the baby. Keep all follow-up and prenatal care visits as told by your health care provider. This is important. What happens during routine prenatal care visits? Your health care provider will:  Measure your weight and  blood pressure.  Check for fetal heart sounds.  Measure the height of your uterus in your abdomen (fundal height). This may be measured starting around week 20 of pregnancy.  Check the position of your baby inside your uterus.  Ask questions about your diet, sleeping patterns, and whether you can feel the baby move.  Review warning signs to watch for and signs of labor.  Ask about any pregnancy symptoms you are having and how you are dealing with them. Symptoms may include: ? Headaches. ? Nausea and vomiting. ? Vaginal discharge. ? Swelling. ? Fatigue. ? Constipation. ? Any discomfort, including back or pelvic pain. Make a list of questions to ask your health care provider at your routine visits. What tests might I have during prenatal care visits? You may have blood, urine, and imaging tests throughout your pregnancy, such as:  Urine tests to check for glucose, protein, or signs of infection.  Glucose tests to check for a form of diabetes that can develop during pregnancy (gestational diabetes mellitus). This is usually done around week 24 of pregnancy.  An ultrasound to check your baby's growth and development and to check for birth defects. This is usually done around week 20 of pregnancy.  A test to check for group B strep (GBS) infection. This is usually done around week 36 of pregnancy.  Genetic testing. This may include blood or imaging tests, such as an ultrasound. Some genetic tests are done during the first trimester and some are done during the second trimester. What else can I expect during prenatal care visits? Your health care provider may recommend getting certain vaccines during pregnancy. These may include:  A yearly flu shot (annual influenza vaccine). This is especially important if you will be pregnant during flu season.  Tdap (tetanus,  diphtheria, pertussis) vaccine. Getting this vaccine during pregnancy can protect your baby from whooping cough (pertussis)  after birth. This vaccine may be recommended between weeks 27 and 36 of pregnancy. Later in your pregnancy, your health care provider may give you information about:  Childbirth and breastfeeding classes.  Choosing a health care provider for your baby.  Umbilical cord banking.  Breastfeeding.  Birth control after your baby is born.  The hospital labor and delivery unit and how to tour it.  Registering at the hospital before you go into labor. Where to find more information  Office on Women's Health: TravelLesson.ca  American Pregnancy Association: americanpregnancy.org  March of Dimes: marchofdimes.org Summary  Prenatal care helps you and your baby stay as healthy as possible during pregnancy.  Your first prenatal care visit will most likely be the longest.  You will have visits and tests throughout your pregnancy to monitor your health and your baby's health.  Bring a list of questions to your visits to ask your health care provider.  Make sure to keep all follow-up and prenatal care visits with your health care provider. This information is not intended to replace advice given to you by your health care provider. Make sure you discuss any questions you have with your health care provider. Document Revised: 04/18/2018 Document Reviewed: 12/26/2016 Elsevier Patient Education  2020 [employer].    COVID-19 and Your Pregnancy FAQ  How can I prevent infection with COVID-19 during my pregnancy? Social distancing is key. Please limit any interactions in public. Try and work from home if possible. Frequently wash your hands after touching possibly contaminated surfaces. Avoid touching your face.  Minimize trips to the store. Consider online ordering when possible.   Should I wear a mask? YES. It is recommended by the CDC that all people wear a cloth mask or facial covering in public. You should wear a mask to your visits in the office. This will help reduce transmission  as well as your risk or acquiring COVID-19. New studies are showing that even asymptomatic individuals can spread the virus from talking.   Where can I get a mask? [place] and the city of [place] are partnering to provide masks to community members. You can pick up a mask from several locations. This website also has instructions about how to make a mask by sewing or without sewing by using a t-shirt or bandana.  https://www.[place].gov/i-want-to/learn-about/covid-19-information-and-updates/covid-19-face-mask-project  Studies have shown that if you were a tube or nylon stocking from pantyhose over a cloth mask it makes the cloth mask almost as effective as a N95 mask.  [employer].de  What are the symptoms of COVID-19? Fever (greater than 100.4 F), dry cough, shortness of breath.  Am I more at risk for COVID-19 since I am pregnant? There is not currently data showing that pregnant women are more adversely impacted by COVID-19 than the general population. However, we know that pregnant women tend to have worse respiratory complications from similar diseases such as the flu and SARS and for this reason should be considered an at-risk population.  What do I do if I am experiencing the symptoms of COVID-19? Testing is being limited because of test availability. If you are experiencing symptoms you should quarantine yourself, and the members of your family, for at least 2 weeks at home.   Please visit this website for more information: DiscoHelp.si.html  When should I go to the Emergency Room? Please go to the emergency room if you are experiencing ANY  of these symptoms*:  1.    Difficulty breathing or shortness of breath 2.    Persistent pain or pressure in the chest 3.    Confusion or  difficulty being aroused (or awakened) 4.    Bluish lips or face  *This list is not all inclusive. Please consult our office for any other symptoms that are severe or concerning.  What do I do if I am having difficulty breathing? You should go to the Emergency Room for evaluation. At this time they have a tent set up for evaluating patients with COVID-19 symptoms.   How will my prenatal care be different because of the COVID-19 pandemic? It has been recommended to reduce the frequency of face-to-face visits and use resources such as telephone and virtual visits when possible. Using a scale, blood pressure machine and fetal doppler at home can further help reduce face-to-face visits. You will be provided with additional information on this topic.  We ask that you come to your visits alone to minimize potential exposures to  COVID-19.  How can I receive childbirth education? At this time in-person classes have been cancelled. You can register for online childbirth education, breastfeeding, and newborn care classes.  Please visit:  CyberComps.hu for more information  How will my hospital birth experience be different? The hospital is currently limiting visitors. This means that while you are in labor you can only have one person at the hospital with you. Additional family members will not be allowed to wait in the building or outside your room. Your one support person can be the father of the baby, a relative, a doula, or a friend. Once one support person is designated that person will wear a band. This band cannot be shared with multiple people.  Nitrous Gas is not being offered for pain relief since the tubing and filter for the machine can not be sanitized in a way to guarantee prevention of transmission of COVID-19.  Nasal cannula use of oxygen for fetal indications has also been discontinued.  Currently a clear plastic sheet is being hung between mom and the delivering provider  during pushing and delivery to help prevent transmission of COVID-19.      How long will I stay in the hospital for after giving birth? It is also recommended that discharge home be expedited during the COVID-19 outbreak. This means staying for 1 day after a vaginal delivery and 2 days after a cesarean section. Patients who need to stay longer for medical reasons are allowed to do so, but the goal will be for expedited discharge home.   What if I have COVID-19 and I am in labor? We ask that you wear a mask while on labor and delivery. We will try and accommodate you being placed in a room that is capable of filtering the air. Please call ahead if you are in labor and on your way to the hospital. The phone number for labor and delivery at [facility] is [phone].  If I have COVID-19 when my baby is born how can I prevent my baby from contracting COVID-19? This is an issue that will have to be discussed on a case-by-case basis. Current recommendations suggest providing separate isolation rooms for both the mother and new infant as well as limiting visitors. However, there are practical challenges to this recommendation. The situation will assuredly change and decisions will be influenced by the desires of the mother and availability of space.  Some suggestions are  the use of a curtain or physical barrier between mom and infant, hand hygiene, mom wearing a mask, or 6 feet of spacing between a mom and infant.   Can I breastfeed during the COVID-19 pandemic?   Yes, breastfeeding is encouraged.  Can I breastfeed if I have COVID-19? Yes. Covid-19 has not been found in breast milk. This means you cannot give COVID-19 to your child through breast milk. Breast feeding will also help pass antibodies to fight infection to your baby.   What precautions should I take when breastfeeding if I have COVID-19? If a mother and newborn do room-in and the mother wishes to feed at the breast,  she should put on a facemask and practice hand hygiene before each feeding.  What precautions should I take when pumping if I have COVID-19? Prior to expressing breast milk, mothers should practice hand hygiene. After each pumping session, all parts that come into contact with breast milk should be thoroughly washed and the entire pump should be appropriately disinfected per the manufacturer's instructions. This expressed breast milk should be fed to the newborn by a healthy caregiver.  What if I am pregnant and work in healthcare? Based on limited data regarding COVID-19 and pregnancy, ACOG currently does not propose creating additional restrictions on pregnant health care personnel because of COVID-19 alone. Pregnant women do not appear to be at higher risk of severe disease related to COVID-19. Pregnant health care personnel should follow CDC risk assessment and infection control guidelines for health care personnel exposed to patients with suspected or confirmed COVID-19. Adherence to recommended infection prevention and control practices is an important part of protecting all health care personnel in health care settings.    Information on COVID-19 in pregnancy is very limited; however, facilities may want to consider limiting exposure of pregnant health care personnel to patients with confirmed or suspected COVID-19 infection, especially during higher-risk procedures (eg, aerosol-generating procedures), if feasible, based on staffing availability.

## 2019-03-15 DIAGNOSIS — R87610 Atypical squamous cells of undetermined significance on cytologic smear of cervix (ASC-US): Secondary | ICD-10-CM | POA: Diagnosis not present

## 2019-03-15 LAB — COMPREHENSIVE METABOLIC PANEL
ALT: 8 IU/L (ref 0–32)
AST: 18 IU/L (ref 0–40)
Albumin/Globulin Ratio: 1.6 (ref 1.2–2.2)
Albumin: 3.6 g/dL — ABNORMAL LOW (ref 3.8–4.8)
Alkaline Phosphatase: 251 IU/L — ABNORMAL HIGH (ref 39–117)
BUN/Creatinine Ratio: 23 (ref 9–23)
BUN: 12 mg/dL (ref 6–20)
Bilirubin Total: 0.3 mg/dL (ref 0.0–1.2)
CO2: 20 mmol/L (ref 20–29)
Calcium: 8.8 mg/dL (ref 8.7–10.2)
Chloride: 104 mmol/L (ref 96–106)
Creatinine, Ser: 0.53 mg/dL — ABNORMAL LOW (ref 0.57–1.00)
GFR calc Af Amer: 141 mL/min/{1.73_m2} (ref >59)
GFR calc non Af Amer: 123 mL/min/{1.73_m2} (ref >59)
Globulin, Total: 2.2 g/dL (ref 1.5–4.5)
Glucose: 76 mg/dL (ref 65–99)
Potassium: 4.5 mmol/L (ref 3.5–5.2)
Sodium: 138 mmol/L (ref 134–144)
Total Protein: 5.8 g/dL — ABNORMAL LOW (ref 6.0–8.5)

## 2019-03-15 LAB — RPR+RH+ABO+RUB AB+AB SCR+CB...
Antibody Screen: NEGATIVE
HIV Screen 4th Generation wRfx: NONREACTIVE
Hematocrit: 31.4 % — ABNORMAL LOW (ref 34.0–46.6)
Hemoglobin: 10.5 g/dL — ABNORMAL LOW (ref 11.1–15.9)
Hepatitis B Surface Ag: NEGATIVE
MCH: 29.1 pg (ref 26.6–33.0)
MCHC: 33.4 g/dL (ref 31.5–35.7)
MCV: 87 fL (ref 79–97)
Platelets: 205 10*3/uL (ref 150–450)
RBC: 3.61 x10E6/uL — ABNORMAL LOW (ref 3.77–5.28)
RDW: 13 % (ref 11.7–15.4)
RPR Ser Ql: NONREACTIVE
Rh Factor: POSITIVE
Rubella Antibodies, IGG: 6.22 index (ref >0.99)
Varicella zoster IgG: 618 index (ref >165)
WBC: 9.2 10*3/uL (ref 3.4–10.8)

## 2019-03-15 LAB — PROTEIN / CREATININE RATIO, URINE
Creatinine, Urine: 120.6 mg/dL
Protein, Ur: 12.9 mg/dL
Protein/Creat Ratio: 107 mg/g creat (ref 0–200)

## 2019-03-15 LAB — HGB A1C W/O EAG: Hgb A1c MFr Bld: 5.1 % (ref 4.8–5.6)

## 2019-03-16 LAB — URINE CULTURE

## 2019-03-19 LAB — CYTOLOGY - PAP
Chlamydia: NEGATIVE
Comment: NEGATIVE
Comment: NEGATIVE
Comment: NEGATIVE
Comment: NORMAL
Diagnosis: UNDETERMINED — AB
High risk HPV: NEGATIVE
Neisseria Gonorrhea: NEGATIVE
Trichomonas: NEGATIVE

## 2019-03-25 ENCOUNTER — Encounter: Payer: Medicaid Other | Admitting: Obstetrics and Gynecology

## 2019-03-25 ENCOUNTER — Other Ambulatory Visit: Payer: Medicaid Other

## 2019-03-28 ENCOUNTER — Other Ambulatory Visit (HOSPITAL_COMMUNITY)
Admission: RE | Admit: 2019-03-28 | Discharge: 2019-03-28 | Disposition: A | Discharge status: Home / Self Care | Payer: Medicaid Other | Source: Ambulatory Visit | Attending: Obstetrics and Gynecology | Admitting: Obstetrics and Gynecology

## 2019-03-28 ENCOUNTER — Other Ambulatory Visit: Payer: Self-pay

## 2019-03-28 ENCOUNTER — Encounter: Payer: Self-pay | Admitting: Obstetrics and Gynecology

## 2019-03-28 ENCOUNTER — Ambulatory Visit (INDEPENDENT_AMBULATORY_CARE_PROVIDER_SITE_OTHER): Payer: Medicaid Other

## 2019-03-28 ENCOUNTER — Ambulatory Visit (INDEPENDENT_AMBULATORY_CARE_PROVIDER_SITE_OTHER): Payer: Medicaid Other | Admitting: Obstetrics and Gynecology

## 2019-03-28 VITALS — BP 120/70 | Wt 115.0 lb

## 2019-03-28 DIAGNOSIS — O0990 Supervision of high risk pregnancy, unspecified, unspecified trimester: Secondary | ICD-10-CM | POA: Insufficient documentation

## 2019-03-28 DIAGNOSIS — O358XX0 Maternal care for other (suspected) fetal abnormality and damage, not applicable or unspecified: Secondary | ICD-10-CM

## 2019-03-28 DIAGNOSIS — O26843 Uterine size-date discrepancy, third trimester: Secondary | ICD-10-CM | POA: Diagnosis not present

## 2019-03-28 DIAGNOSIS — F1123 Opioid dependence with withdrawal: Secondary | ICD-10-CM

## 2019-03-28 DIAGNOSIS — O321XX0 Maternal care for breech presentation, not applicable or unspecified: Secondary | ICD-10-CM | POA: Diagnosis not present

## 2019-03-28 DIAGNOSIS — O0993 Supervision of high risk pregnancy, unspecified, third trimester: Secondary | ICD-10-CM

## 2019-03-28 DIAGNOSIS — Z3A36 36 weeks gestation of pregnancy: Secondary | ICD-10-CM | POA: Insufficient documentation

## 2019-03-28 DIAGNOSIS — O99323 Drug use complicating pregnancy, third trimester: Secondary | ICD-10-CM

## 2019-03-28 DIAGNOSIS — O09893 Supervision of other high risk pregnancies, third trimester: Secondary | ICD-10-CM

## 2019-03-28 NOTE — Progress Notes (Signed)
Routine Prenatal Care Visit  Subjective  Alexis Avery is a 39 y.o. [phone] at [redacted]w[redacted]d being seen today for ongoing prenatal care.  She is currently monitored for the following issues for this high-risk pregnancy and has Nicotine addiction; Anxiety; History of preterm delivery, currently pregnant; Drug use complicating pregnancy; Opiate dependence (HCC); MRSA (methicillin resistant staph aureus) culture positive; Supervision of high risk pregnancy, antepartum, third trimester; Cervical high risk human papillomavirus (HPV) DNA test positive; Methamphetamine abuse ([place]); Opioid use disorder ([place]); and Supervision of high risk pregnancy, antepartum on their problem list.  ----------------------------------------------------------------------------------- Patient reports no complaints.   Contractions: Not present. Vag. Bleeding: None.  Movement: Present. Denies leaking of fluid.  ----------------------------------------------------------------------------------- The following portions of the patient's history were reviewed and updated as appropriate: allergies, current medications, past family history, past medical history, past social history, past surgical history and problem list. Problem list updated.   Objective  Blood pressure 120/70, weight 115 lb (52.2 kg), last menstrual period 10/07/2018. Pregravid weight 100 lb (45.4 kg) Total Weight Gain 15 lb (6.804 kg) Urinalysis:      Fetal Status: Fetal Heart Rate (bpm): 165 Fundal Height: 35 cm Movement: Present  Presentation: Complete Breech  General:  Alert, oriented and cooperative. Patient is in no acute distress.  Skin: Skin is warm and dry. No rash noted.   Cardiovascular: Normal heart rate noted  Respiratory: Normal respiratory effort, no problems with respiration noted  Abdomen: Soft, gravid, appropriate for gestational age. Pain/Pressure: Absent     Pelvic:  Cervical exam deferred        Extremities: Normal range of motion.  Edema:  None  Mental Status: Normal mood and affect. Normal behavior. Normal judgment and thought content.     Assessment   39 y.o. [id number] at [redacted]w[redacted]d by  04/20/2019, by Ultrasound presenting for routine prenatal visit  Plan   pregnancy4  Problems (from 10/07/18 to present)    Problem Noted Resolved   Supervision of high risk pregnancy, antepartum, third trimester 01/07/2017 by Aletha Halim, MD No   Overview Addendum 03/28/2019 12:39 PM by Homero Fellers, MD     [facility]inic [facility] Prenatal Labs  Dating 36 wk [place] Blood type: O/Positive/-- (03/04 1424)   Genetic Screen None- Late [facility] Antibody:Negative (03/04 1424)  Anatomic [place]  Incomplete Rubella: 6.22 (03/04 1424) Varicella: Immune  GTT Third Tri: Hgb A1C WNL RPR: Non Reactive (03/04 1424)   Rhogam  not needed HBsAg: Negative (03/04 1424)   Vaccines TDAP:                       Flu Shot: per pt had vacc HIV: Non Reactive (03/04 1424)   Baby Food May consider breast              GBS:   Contraception  Tubal, consents signed 03/28/2019 Pap: 03/14/19 ASCUS neg HR HPV  CBB     CS/VBAC NA   Support Person Husband Eddie Dibbles              Discussed limited US results with patient and husband Discussed breech presentation and options for scheduled cesarean section versus attempted version. She would like to proceed with a version. Coordinating care with L&D.  GBS/ GC/ CT today Reports that the last time she used substances was 2 months ago.  She has been taking 8 mg of Subutex daily for the last 2 months. Reports she has been tested for hepatitis C in the past and was  negative, will need to be rechecked at next visit, left before labs today. I do not see any results in the system or care everywhere for hepatitis C screening. Will need UDS. Positive for opiates, amphetamine and methamphetamine in December for 2020 per care everywhere.  Review of medical records after visit.  shows hx of HSV1, will need to offer valtrex at next visit if she has a hx  of genital lesions.   Gestational age appropriate obstetric precautions including but not limited to vaginal bleeding, contractions, leaking of fluid and fetal movement were reviewed in detail with the patient.    Return in about 1 week (around 04/04/2019) for ROB in person- MD.  Natale Milch MD [facility], [facility] 03/28/2019, 12:40 PM

## 2019-03-28 NOTE — Progress Notes (Signed)
ROB/anatomy   C/o nausea and vomiting Denies lof, no vb, Good FM

## 2019-03-30 ENCOUNTER — Encounter: Payer: Self-pay | Admitting: Obstetrics and Gynecology

## 2019-03-30 ENCOUNTER — Other Ambulatory Visit: Payer: Self-pay

## 2019-03-30 ENCOUNTER — Inpatient Hospital Stay
Admission: EM | Admit: 2019-03-30 | Discharge: 2019-04-01 | DRG: 806 | Disposition: A | Discharge status: Home / Self Care | Payer: Medicaid Other | Attending: Obstetrics and Gynecology | Admitting: Obstetrics and Gynecology

## 2019-03-30 DIAGNOSIS — O99324 Drug use complicating childbirth: Principal | ICD-10-CM | POA: Diagnosis present

## 2019-03-30 DIAGNOSIS — B1920 Unspecified viral hepatitis C without hepatic coma: Secondary | ICD-10-CM | POA: Diagnosis present

## 2019-03-30 DIAGNOSIS — Z8614 Personal history of Methicillin resistant Staphylococcus aureus infection: Secondary | ICD-10-CM

## 2019-03-30 DIAGNOSIS — O9902 Anemia complicating childbirth: Secondary | ICD-10-CM | POA: Diagnosis present

## 2019-03-30 DIAGNOSIS — F17210 Nicotine dependence, cigarettes, uncomplicated: Secondary | ICD-10-CM | POA: Diagnosis present

## 2019-03-30 DIAGNOSIS — Z20822 Contact with and (suspected) exposure to covid-19: Secondary | ICD-10-CM | POA: Diagnosis present

## 2019-03-30 DIAGNOSIS — D649 Anemia, unspecified: Secondary | ICD-10-CM | POA: Diagnosis present

## 2019-03-30 DIAGNOSIS — O26893 Other specified pregnancy related conditions, third trimester: Secondary | ICD-10-CM | POA: Diagnosis present

## 2019-03-30 DIAGNOSIS — O9842 Viral hepatitis complicating childbirth: Secondary | ICD-10-CM | POA: Diagnosis present

## 2019-03-30 DIAGNOSIS — O99334 Smoking (tobacco) complicating childbirth: Secondary | ICD-10-CM | POA: Diagnosis present

## 2019-03-30 DIAGNOSIS — Z3A37 37 weeks gestation of pregnancy: Secondary | ICD-10-CM

## 2019-03-30 DIAGNOSIS — F1120 Opioid dependence, uncomplicated: Secondary | ICD-10-CM | POA: Diagnosis present

## 2019-03-30 LAB — RESPIRATORY PANEL BY RT PCR (FLU A&B, COVID)
Influenza A by PCR: NEGATIVE
Influenza B by PCR: NEGATIVE
SARS Coronavirus 2 by RT PCR: NEGATIVE

## 2019-03-30 LAB — CBC
HCT: 32 % — ABNORMAL LOW (ref 36.0–46.0)
Hemoglobin: 10.4 g/dL — ABNORMAL LOW (ref 12.0–15.0)
MCH: 28.4 pg (ref 26.0–34.0)
MCHC: 32.5 g/dL (ref 30.0–36.0)
MCV: 87.4 fL (ref 80.0–100.0)
Platelets: 181 10*3/uL (ref 150–400)
RBC: 3.66 MIL/uL — ABNORMAL LOW (ref 3.87–5.11)
RDW: 12.9 % (ref 11.5–15.5)
WBC: 7.5 10*3/uL (ref 4.0–10.5)
nRBC: 0 % (ref 0.0–0.2)

## 2019-03-30 LAB — RPR: RPR Ser Ql: NONREACTIVE

## 2019-03-30 LAB — URINE DRUG SCREEN, QUALITATIVE (ARMC ONLY)
Amphetamines, Ur Screen: POSITIVE — AB
Barbiturates, Ur Screen: NOT DETECTED
Benzodiazepine, Ur Scrn: NOT DETECTED
Cannabinoid 50 Ng, Ur ~~LOC~~: NOT DETECTED
Cocaine Metabolite,Ur ~~LOC~~: NOT DETECTED
MDMA (Ecstasy)Ur Screen: NOT DETECTED
Methadone Scn, Ur: NOT DETECTED
Opiate, Ur Screen: NOT DETECTED
Phencyclidine (PCP) Ur S: NOT DETECTED
Tricyclic, Ur Screen: NOT DETECTED

## 2019-03-30 LAB — TYPE AND SCREEN
ABO/RH(D): O POS
Antibody Screen: NEGATIVE

## 2019-03-30 MED ORDER — BENZOCAINE-MENTHOL 20-0.5 % EX AERO: 1.0000 "application " | INHALATION_SPRAY | CUTANEOUS | Status: DC | PRN

## 2019-03-30 MED ORDER — AMMONIA AROMATIC IN INHA
RESPIRATORY_TRACT | Status: AC
  Filled 2019-03-30: qty 10

## 2019-03-30 MED ORDER — SENNOSIDES-DOCUSATE SODIUM 8.6-50 MG PO TABS
2.0000 | ORAL_TABLET | ORAL | Status: DC
  Administered 2019-03-30 – 2019-03-31 (×2): 2 via ORAL
  Filled 2019-03-30 (×2): qty 2

## 2019-03-30 MED ORDER — LIDOCAINE HCL (PF) 1 % IJ SOLN: 30.0000 mL | INTRAMUSCULAR | Status: DC | PRN

## 2019-03-30 MED ORDER — SOD CITRATE-CITRIC ACID 500-334 MG/5ML PO SOLN: 30.0000 mL | ORAL | Status: DC | PRN

## 2019-03-30 MED ORDER — ACETAMINOPHEN 325 MG PO TABS
650.0000 mg | ORAL_TABLET | ORAL | Status: DC | PRN
  Administered 2019-03-30: 650 mg via ORAL
  Filled 2019-03-30: qty 2

## 2019-03-30 MED ORDER — DIBUCAINE (PERIANAL) 1 % EX OINT: 1.0000 "application " | TOPICAL_OINTMENT | CUTANEOUS | Status: DC | PRN

## 2019-03-30 MED ORDER — PRENATAL MULTIVITAMIN CH
1.0000 | ORAL_TABLET | Freq: Every day | ORAL | Status: DC
  Administered 2019-03-30 – 2019-03-31 (×2): 1 via ORAL
  Filled 2019-03-30 (×2): qty 1

## 2019-03-30 MED ORDER — ONDANSETRON HCL 4 MG/2ML IJ SOLN: 4.0000 mg | INTRAMUSCULAR | Status: DC | PRN

## 2019-03-30 MED ORDER — DIPHENHYDRAMINE HCL 25 MG PO CAPS: 25.0000 mg | ORAL_CAPSULE | Freq: Four times a day (QID) | ORAL | Status: DC | PRN

## 2019-03-30 MED ORDER — OXYTOCIN BOLUS FROM INFUSION: 500.0000 mL | Freq: Once | INTRAVENOUS | Status: DC

## 2019-03-30 MED ORDER — LACTATED RINGERS IV SOLN: 500.0000 mL | INTRAVENOUS | Status: DC | PRN

## 2019-03-30 MED ORDER — TETANUS-DIPHTH-ACELL PERTUSSIS 5-2.5-18.5 LF-MCG/0.5 IM SUSP: 0.5000 mL | Freq: Once | INTRAMUSCULAR | Status: DC

## 2019-03-30 MED ORDER — WITCH HAZEL-GLYCERIN EX PADS: 1.0000 "application " | MEDICATED_PAD | CUTANEOUS | Status: DC | PRN

## 2019-03-30 MED ORDER — LACTATED RINGERS IV SOLN: INTRAVENOUS | Status: DC

## 2019-03-30 MED ORDER — ONDANSETRON HCL 4 MG PO TABS: 4.0000 mg | ORAL_TABLET | ORAL | Status: DC | PRN

## 2019-03-30 MED ORDER — IBUPROFEN 600 MG PO TABS
600.0000 mg | ORAL_TABLET | Freq: Four times a day (QID) | ORAL | Status: DC
  Administered 2019-03-30 – 2019-04-01 (×11): 600 mg via ORAL
  Filled 2019-03-30 (×10): qty 1

## 2019-03-30 MED ORDER — ONDANSETRON HCL 4 MG/2ML IJ SOLN: 4.0000 mg | Freq: Four times a day (QID) | INTRAMUSCULAR | Status: DC | PRN

## 2019-03-30 MED ORDER — OXYCODONE-ACETAMINOPHEN 5-325 MG PO TABS: 1.0000 | ORAL_TABLET | ORAL | Status: DC | PRN

## 2019-03-30 MED ORDER — OXYTOCIN 10 UNIT/ML IJ SOLN
INTRAMUSCULAR | Status: AC
  Filled 2019-03-30: qty 2

## 2019-03-30 MED ORDER — ACETAMINOPHEN 325 MG PO TABS: 650.0000 mg | ORAL_TABLET | ORAL | Status: DC | PRN

## 2019-03-30 MED ORDER — LIDOCAINE HCL (PF) 1 % IJ SOLN
INTRAMUSCULAR | Status: AC
  Filled 2019-03-30: qty 30

## 2019-03-30 MED ORDER — SIMETHICONE 80 MG PO CHEW: 80.0000 mg | CHEWABLE_TABLET | ORAL | Status: DC | PRN

## 2019-03-30 MED ORDER — MISOPROSTOL 200 MCG PO TABS
ORAL_TABLET | ORAL | Status: AC
  Filled 2019-03-30: qty 4

## 2019-03-30 MED ORDER — IBUPROFEN 600 MG PO TABS
ORAL_TABLET | ORAL | Status: AC
  Filled 2019-03-30: qty 1

## 2019-03-30 MED ORDER — SODIUM CHLORIDE 0.9 % IV SOLN
2.0000 g | Freq: Once | INTRAVENOUS | Status: AC
  Administered 2019-03-30: 04:00:00 2 g via INTRAVENOUS
  Filled 2019-03-30: qty 2000

## 2019-03-30 MED ORDER — OXYCODONE-ACETAMINOPHEN 5-325 MG PO TABS: 2.0000 | ORAL_TABLET | ORAL | Status: DC | PRN

## 2019-03-30 MED ORDER — OXYTOCIN 40 UNITS IN NORMAL SALINE INFUSION - SIMPLE MED
2.5000 [IU]/h | INTRAVENOUS | Status: DC
  Filled 2019-03-30: qty 1000

## 2019-03-30 MED ORDER — BUPRENORPHINE HCL-NALOXONE HCL 8-2 MG SL SUBL
1.0000 | SUBLINGUAL_TABLET | Freq: Every day | SUBLINGUAL | Status: DC
  Administered 2019-03-30 – 2019-04-01 (×3): 1 via SUBLINGUAL
  Filled 2019-03-30 (×3): qty 1

## 2019-03-30 MED ORDER — COCONUT OIL OIL: 1.0000 "application " | TOPICAL_OIL | Status: DC | PRN

## 2019-03-30 NOTE — H&P (Signed)
Obstetric H&P   Chief Complaint: Contractions, bleeding and leaking fluid  Prenatal Care Provider: [facility]  History of Present Illness: 39 y.o. [id number] [redacted]w[redacted]d by 04/20/2019, by Ultrasound presenting to L&D with ruptured membranes and 5cm dilated.  On recent ultrasound 03/28/2019 fetus was noted to be complete breach, on exam today sutures are palpated and utlrasound confirms vtx presentation.  Pregnancy complicated by substance abuse on subutex.  The patient does have a history of HSV and hepatitis C.  Poor dating by 36 week ultrasound with incomplete anatomy scan of heart, spine, extremities.   Pregravid weight 45.4 kg Total Weight Gain 6.804 kg  pregnancy4  Problems (from 10/07/18 to present)    Problem Noted Resolved   Supervision of high risk pregnancy, antepartum, third trimester 01/07/2017 by Berea Bing, MD No   Overview Addendum 03/28/2019 12:39 PM by Natale Milch, MD     [facility] Prenatal Labs  Dating 36 wk [place] Blood type: O/Positive/-- (03/04 1424)   Genetic Screen None- Late [facility] Antibody:Negative (03/04 1424)  Anatomic [place]  Incomplete Rubella: 6.22 (03/04 1424) Varicella: Immune  GTT Third Tri: Hgb A1C WNL RPR: Non Reactive (03/04 1424)   Rhogam  not needed HBsAg: Negative (03/04 1424)   Vaccines TDAP:                       Flu Shot: per pt had vacc HIV: Non Reactive (03/04 1424)   Baby Food May consider breast              GBS:   Contraception  Tubal, consents signed 03/28/2019 Pap: 03/14/19 ASCUS neg HR HPV  CBB     CS/VBAC NA   Support Person Husband Renae Fickle              Review of Systems: 10 point review of systems negative unless otherwise noted in HPI  Past Medical History: Patient Active Problem List   Diagnosis Date Noted  . Labor and delivery, indication for care 03/30/2019  . Normal labor 03/30/2019  . Supervision of high risk pregnancy, antepartum 03/14/2019  . 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 Opioid use disorder (HCC) 12/03/2018    . Cervical high risk human papillomavirus (HPV) DNA test positive 01/23/2017    12/2012 Needs repeat pap post partum   . Supervision of high risk pregnancy, antepartum, third trimester 01/07/2017     [facility] Prenatal Labs  Dating 36 wk [place] Blood type: O/Positive/-- (03/04 1424)   Genetic Screen None- Late [facility] Antibody:Negative (03/04 1424)  Anatomic [place]  Incomplete Rubella: 6.22 (03/04 1424) Varicella: Immune  GTT Third Tri: Hgb A1C WNL RPR: Non Reactive (03/04 1424)   Rhogam  not needed HBsAg: Negative (03/04 1424)   Vaccines TDAP:                       Flu Shot: per pt had vacc HIV: Non Reactive (03/04 1424)   Baby Food May consider breast              GBS:   Contraception  Tubal, consents signed 03/28/2019 Pap: 03/14/19 ASCUS neg HR HPV  CBB     CS/VBAC NA   Support Person Husband Renae Fickle       . MRSA (methicillin resistant staph aureus) culture positive   . Opiate dependence (HCC) 01/21/2014  . History of preterm delivery, currently pregnant 01/16/2013    @ 36wks, IOL d/t oligo   . Drug use complicating pregnancy  01/16/2013    12/2016: +THC  10/24/12: UDS + benzo, amphetamines, and barbituates          Counseled 12/05/12      Repeat 01/16/13 @ 26wks: neg for all   . Nicotine addiction 10/24/2012  . Anxiety 10/24/2012    Past Surgical History: Past Surgical History:  Procedure Laterality Date  . CHOLECYSTECTOMY    . TONSILLECTOMY      Past Obstetric History: # 1 - Date: None, Sex: None, Weight: None, GA: None, Delivery: None, Apgar1: None, Apgar5: None, Living: None, Birth Comments: None  # 2 - Date: 09/04/09, Sex: Female, Weight: 2466 g, GA: [redacted]w[redacted]d, Delivery: Vaginal, Spontaneous, Apgar1: None, Apgar5: None, Living: Living, Birth Comments: None  # 3 - Date: 04/18/13, Sex: Female, Weight: 2892 g, GA: [redacted]w[redacted]d, Delivery: Vaginal, Spontaneous, Apgar1: 5, Apgar5: 9, Living: Living, Birth Comments: None  # 4 - Date: 02/07/17, Sex: Female, Weight: 2934 g, GA: [redacted]w[redacted]d, Delivery:  Vaginal, Vacuum (Extractor), Apgar1: 6, Apgar5: 8, Living: Living, Birth Comments: None  # 5 - Date: None, Sex: None, Weight: None, GA: None, Delivery: None, Apgar1: None, Apgar5: None, Living: None, Birth Comments: None   Past Gynecologic History:  Family History: Family History  Problem Relation Age of Onset  . Hypertension Mother   . Cancer Maternal Grandmother        breast  . Hypertension Maternal Uncle   . Heart attack Maternal Uncle   . Heart attack Father     Social History: Social History   Socioeconomic History  . Marital status: Married    Spouse name: Not on file  . Number of children: Not on file  . Years of education: Not on file  . Highest education level: Not on file  Occupational History  . Not on file  Tobacco Use  . Smoking status: Current Every Day Smoker    Packs/day: 0.50    Types: Cigarettes  . Smokeless tobacco: Never Used  Substance and Sexual Activity  . Alcohol use: No  . Drug use: No    Comment: not used heroin since Feb 2018  . Sexual activity: Yes    Birth control/protection: None  Other Topics Concern  . Not on file  Social History Narrative  . Not on file   Social Determinants of Health   Financial Resource Strain:   . Difficulty of Paying Living Expenses:   Food Insecurity:   . Worried About Programme researcher, broadcasting/film/video in the Last Year:   . Barista in the Last Year:   Transportation Needs:   . Freight forwarder (Medical):   Marland Kitchen Lack of Transportation (Non-Medical):   Physical Activity:   . Days of Exercise per Week:   . Minutes of Exercise per Session:   Stress:   . Feeling of Stress :   Social Connections:   . Frequency of Communication with Friends and Family:   . Frequency of Social Gatherings with Friends and Family:   . Attends Religious Services:   . Active Member of Clubs or Organizations:   . Attends Banker Meetings:   Marland Kitchen Marital Status:   Intimate Partner Violence:   . Fear of Current or  Ex-Partner:   . Emotionally Abused:   Marland Kitchen Physically Abused:   . Sexually Abused:     Medications: Prior to Admission medications   Medication Sig Start Date End Date Taking? Authorizing Provider  buprenorphine (SUBUTEX) 2 MG SUBL SL tablet Place under the tongue daily.  [provider]  cetirizine (ZYRTEC) 10 MG tablet Take 10 mg by mouth daily.    [provider]  Prenatal Vit-Fe Fumarate-FA (PRENATAL VITAMIN) 27-0.8 MG TABS Take by mouth.    [provider]    Allergies: No Known Allergies  Physical Exam: Vitals: Last menstrual period 10/07/2018.  Urine Dip Protein: N/A  FHT: 110, moderate, +accles, no decels Toco: q84min  General: NAD HEENT: normocephalic, anicteric Pulmonary: No increased work of breathing Cardiovascular: RRR, distal pulses 2+ Abdomen: Gravid, non-tender Le[place]:6lbs Genitourinary: 5/C/0 grossly ruptured vertex Extremities: no edema, erythema, or tenderness Neurologic: Grossly intact Psychiatric: mood appropriate, affect full  Labs: No results found for this or any previous visit (from the past 24 hour(s)).  Assessment: 39 y.o. [id number] [redacted]w[redacted]d by 04/20/2019, by Ultrasound presenting in term labor  Plan: 1) Labor - expectant mangement  2) Fetus - cat I tracing - 3096g 6lbs 13oz on 03/28/2019 scan - vertex by exam and ultrasound - GBS unknown with history of prior GBS colonization in prior pregnancy will start ampicillin  3) PNL - Blood type O/Positive/-- (03/04 1424) / Anti-bodyscreen Negative (03/04 1424) / Rubella 6.22 (03/04 1424) / Varicella Immune / RPR Non Reactive (03/04 1424) / HBsAg Negative (03/04 1424) / HIV Non Reactive (03/04 1424) /  / GBS    4) Immunization History -  Immunization History  Administered Date(s) Administered  . Influenza,inj,Quad PF,6+ Mos 10/24/2012, 01/23/2017  . Influenza-Unspecified 10/24/2018  . Tdap 01/23/2017    5) HSV - no lesions noted on exam  6) Disposition - pending  delivery  Malachy Mood, MD, [place], [facility] 03/30/2019, 3:20 AM

## 2019-03-30 NOTE — Discharge Summary (Addendum)
Obstetric Discharge Summary Reason for Admission: onset of labor Prenatal Procedures: none Intrapartum Procedures: spontaneous vaginal delivery Postpartum Procedures: none Complications-Operative and Postpartum: none   Patient has been off unit several times in the past 24 hours. Her blood pressure has been elevated since then. Will check [name] labs given her history of gestational hypertension.   [place] labs are not concerning for preeclampsia. Patient responded very well to 10 mg PO Procardia. She denies any illicit drug use while off unit. Plan for patient to return to clinic for BP check in 1 week. Precautions reviewed and patient verbalizes understanding.   CSW to visit with patient prior to discharge.  Hemoglobin  Date Value Ref Range Status  04/01/2019 9.6 (L) 12.0 - 15.0 g/dL Final  03/14/2019 10.5 (L) 11.1 - 15.9 g/dL Final   HCT  Date Value Ref Range Status  04/01/2019 29.1 (L) 36.0 - 46.0 % Final   Hematocrit  Date Value Ref Range Status  03/14/2019 31.4 (L) 34.0 - 46.6 % Final    Physical Exam:  General: alert, cooperative and no distress Lochia: appropriate Uterine Fundus: firm Incision: NA DVT Evaluation: No evidence of DVT seen on physical exam.  Patient Vitals for the past 24 hrs:  BP Temp Temp src Pulse Resp SpO2  04/01/19 1534 125/85 97.8 F (36.6 C) Axillary 61 18 -  04/01/19 1303 118/78 - - - - -  04/01/19 1210 (!) 167/96 - - - - -  04/01/19 1201 (!) 167/105 - - 63 - -  04/01/19 1133 (!) 161/91 98.4 F (36.9 C) Oral 64 18 -    Patient Vitals for the past 24 hrs:  BP Temp Temp src Pulse Resp SpO2  04/01/19 1534 125/85 97.8 F (36.6 C) Axillary 61 18 -  04/01/19 1303 118/78 - - - - -  04/01/19 1210 (!) 167/96 - - - - -  04/01/19 1201 (!) 167/105 - - 63 - -  04/01/19 1133 (!) 161/91 98.4 F (36.9 C) Oral 64 18 -  04/01/19 0800 (!) 146/94 97.7 F (36.5 C) Oral 62 20 100 %  04/01/19 0024 (!) 153/104 - - 66 - -  03/31/19 2340 (!) 153/106 98.4 F (36.9  C) Oral 68 18 100 %   Results for Alexis Avery, Alexis Avery (MRN [id number]) as of 04/01/2019 18:43  Ref. Range 04/01/2019 11:22 04/01/2019 12:15  Sodium Latest Ref Range: 135 - 145 mmol/L 137   Potassium Latest Ref Range: 3.5 - 5.1 mmol/L 3.8   Chloride Latest Ref Range: 98 - 111 mmol/L 105   CO2 Latest Ref Range: 22 - 32 mmol/L 25   Glucose Latest Ref Range: 70 - 99 mg/dL 82   BUN Latest Ref Range: 6 - 20 mg/dL 12   Creatinine Latest Ref Range: 0.44 - 1.00 mg/dL 0.53   Calcium Latest Ref Range: 8.9 - 10.3 mg/dL 8.2 (L)   Anion gap Latest Ref Range: 5 - 15  7   Alkaline Phosphatase Latest Ref Range: 38 - 126 U/L 197 (H)   Albumin Latest Ref Range: 3.5 - 5.0 g/dL 2.5 (L)   AST Latest Ref Range: 15 - 41 U/L 34   ALT Latest Ref Range: 0 - 44 U/L 25   Total Protein Latest Ref Range: 6.5 - 8.1 g/dL 5.6 (L)   Total Bilirubin Latest Ref Range: 0.3 - 1.2 mg/dL 0.5   GFR, Est Non African American Latest Ref Range: >60 mL/min >60   GFR, Est African American Latest Ref Range: >60 mL/min >60  WBC Latest Ref Range: 4.0 - 10.5 K/uL 9.5   RBC Latest Ref Range: 3.87 - 5.11 MIL/uL 3.36 (L)   Hemoglobin Latest Ref Range: 12.0 - 15.0 g/dL 9.6 (L)   HCT Latest Ref Range: 36.0 - 46.0 % 29.1 (L)   MCV Latest Ref Range: 80.0 - 100.0 fL 86.6   MCH Latest Ref Range: 26.0 - 34.0 pg 28.6   MCHC Latest Ref Range: 30.0 - 36.0 g/dL 09.3   RDW Latest Ref Range: 11.5 - 15.5 % 13.2   Platelets Latest Ref Range: 150 - 400 K/uL 186   nRBC Latest Ref Range: 0.0 - 0.2 % 0.0   Total Protein, Urine Latest Units: mg/dL  16  Protein Creatinine Ratio Latest Ref Range: 0.00 - 0.15 mg/mgCre  0.18 (H)  Creatinine, Urine Latest Units: mg/dL  89  Amphetamines, Ur Screen Latest Ref Range: NONE DETECTED   POSITIVE (A)  Barbiturates, Ur Screen Latest Ref Range: NONE DETECTED   NONE DETECTED  Benzodiazepine, Ur Scrn Latest Ref Range: NONE DETECTED   NONE DETECTED  Cocaine Metabolite,Ur [place] Latest Ref Range: NONE DETECTED   NONE DETECTED   Methadone Scn, Ur Latest Ref Range: NONE DETECTED   NONE DETECTED  MDMA (Ecstasy)Ur Screen Latest Ref Range: NONE DETECTED   NONE DETECTED  Cannabinoid 50 Ng, Ur [place] Latest Ref Range: NONE DETECTED   NONE DETECTED  Opiate, Ur Screen Latest Ref Range: NONE DETECTED   NONE DETECTED  Phencyclidine (PCP) Ur S Latest Ref Range: NONE DETECTED   NONE DETECTED  Tricyclic, Ur Screen Latest Ref Range: NONE DETECTED   NONE DETECTED    Discharge Diagnoses: Term Pregnancy-delivered  Discharge Information: Date: 04/01/2019 Activity: pelvic rest Diet: routine Allergies as of 04/01/2019   No Known Allergies     Medication List    TAKE these medications   buprenorphine 2 MG Subl SL tablet Commonly known as: SUBUTEX Place under the tongue daily.   cetirizine 10 MG tablet Commonly known as: ZYRTEC Take 10 mg by mouth daily.   Prenatal Vitamin 27-0.8 MG Tabs Take by mouth.       Condition: stable Discharge to: home Follow-up Information    Vena Austria, MD Follow up in 4 week(s).   Specialty: Obstetrics and Gynecology Why: postpartum visit tubal ligation scheduling Contact information: [address] [place] [address] [phone]        [facility]. Schedule an appointment as soon as possible for a visit in 1 week(s).   Specialty: Obstetrics and Gynecology Why: Blood pressure check Contact information: [address] [place] [address] [phone]          Newborn Data: Live born female Harrold Donath Birth Weight:  6 pounds 9 ounces APGAR: 9, 9  Newborn Delivery   Birth date/time: 03/30/2019 04:01:00 Delivery type: Vaginal, Spontaneous     Newborn disposition: SCN for eat/sleep/console/management of withdrawal symptoms   Tresea Mall, CNM 04/01/2019, 6:45 PM

## 2019-03-30 NOTE — Progress Notes (Signed)
Pt arrived from home with complaints of contractions for the last hour. She states her water just broke in the car on the way here. Moderate amount of clear, bloody fluid noted. Pt grunting and feeling the urge to push. Pt was breech by ultrasound 2 days ago. Dr Bonney Aid present in department and called into room to assess. EFM and TOCO applied.

## 2019-03-30 NOTE — Plan of Care (Signed)
  Problem: Education: Goal: Knowledge of Childbirth will improve Outcome: Completed/Met Goal: Ability to make informed decisions regarding treatment and plan of care will improve Outcome: Completed/Met Goal: Ability to state and carry out methods to decrease the pain will improve Outcome: Completed/Met Goal: Individualized Educational Video(s) Outcome: Completed/Met

## 2019-03-31 LAB — CBC
HCT: 28.8 % — ABNORMAL LOW (ref 36.0–46.0)
Hemoglobin: 9.5 g/dL — ABNORMAL LOW (ref 12.0–15.0)
MCH: 28.7 pg (ref 26.0–34.0)
MCHC: 33 g/dL (ref 30.0–36.0)
MCV: 87 fL (ref 80.0–100.0)
Platelets: 201 10*3/uL (ref 150–400)
RBC: 3.31 MIL/uL — ABNORMAL LOW (ref 3.87–5.11)
RDW: 13.2 % (ref 11.5–15.5)
WBC: 8.1 10*3/uL (ref 4.0–10.5)
nRBC: 0 % (ref 0.0–0.2)

## 2019-03-31 NOTE — Progress Notes (Signed)
Post Partum Day 1 Subjective: no complaints, up ad lib, voiding and tolerating PO. Tired, baby fussy during the night  Objective: Blood pressure 137/84, pulse 63, temperature 98.2 F (36.8 C), temperature source Oral, resp. rate 20, height 4\' 9"  (1.448 m), weight 52.2 kg, last menstrual period 10/07/2018, SpO2 99 %, unknown if currently breastfeeding. Occasional mild BP elevation (142/91) Physical Exam:  General: cooperative, appears older than stated age and fatigued Lochia: appropriate Uterine Fundus: firm/ U-1/ML/NT DVT Evaluation: No evidence of DVT seen on physical exam.  Recent Labs    03/30/19 0337 03/31/19 0640  HGB 10.4* 9.5*  HCT 32.0* 28.8*  WBC 7.5 8.1  PLT 181 201    Assessment/Plan: Stable PPD #1-continue postpartum care Hx of opioid dependence on Subutex/ UDS positive for amphetamines  Continue Eat, Sleep, Console for baby  Continue Subutex  Social Services consult  Hepatitis C RNA drawn today (hx of heroin use)  Mild Anemia-iron and vitamin supplements Continue to monitor blood pressures O POS/ RI/ VI Bottle Desires interval  BTL  04/02/19, CNM      LOS: 1 day   Alexis Avery 03/31/2019, 11:09 AM

## 2019-04-01 LAB — URINE DRUG SCREEN, QUALITATIVE (ARMC ONLY)
Amphetamines, Ur Screen: POSITIVE — AB
Barbiturates, Ur Screen: NOT DETECTED
Benzodiazepine, Ur Scrn: NOT DETECTED
Cannabinoid 50 Ng, Ur ~~LOC~~: NOT DETECTED
Cocaine Metabolite,Ur ~~LOC~~: NOT DETECTED
MDMA (Ecstasy)Ur Screen: NOT DETECTED
Methadone Scn, Ur: NOT DETECTED
Opiate, Ur Screen: NOT DETECTED
Phencyclidine (PCP) Ur S: NOT DETECTED
Tricyclic, Ur Screen: NOT DETECTED

## 2019-04-01 LAB — COMPREHENSIVE METABOLIC PANEL
ALT: 25 U/L (ref 0–44)
AST: 34 U/L (ref 15–41)
Albumin: 2.5 g/dL — ABNORMAL LOW (ref 3.5–5.0)
Alkaline Phosphatase: 197 U/L — ABNORMAL HIGH (ref 38–126)
Anion gap: 7 (ref 5–15)
BUN: 12 mg/dL (ref 6–20)
CO2: 25 mmol/L (ref 22–32)
Calcium: 8.2 mg/dL — ABNORMAL LOW (ref 8.9–10.3)
Chloride: 105 mmol/L (ref 98–111)
Creatinine, Ser: 0.53 mg/dL (ref 0.44–1.00)
GFR calc Af Amer: >60 mL/min (ref >60)
GFR calc non Af Amer: >60 mL/min (ref >60)
Glucose, Bld: 82 mg/dL (ref 70–99)
Potassium: 3.8 mmol/L (ref 3.5–5.1)
Sodium: 137 mmol/L (ref 135–145)
Total Bilirubin: 0.5 mg/dL (ref 0.3–1.2)
Total Protein: 5.6 g/dL — ABNORMAL LOW (ref 6.5–8.1)

## 2019-04-01 LAB — CERVICOVAGINAL ANCILLARY ONLY
Chlamydia: NEGATIVE
Comment: NEGATIVE
Comment: NEGATIVE
Comment: NORMAL
Neisseria Gonorrhea: NEGATIVE
Trichomonas: NEGATIVE

## 2019-04-01 LAB — CBC
HCT: 29.1 % — ABNORMAL LOW (ref 36.0–46.0)
Hemoglobin: 9.6 g/dL — ABNORMAL LOW (ref 12.0–15.0)
MCH: 28.6 pg (ref 26.0–34.0)
MCHC: 33 g/dL (ref 30.0–36.0)
MCV: 86.6 fL (ref 80.0–100.0)
Platelets: 186 10*3/uL (ref 150–400)
RBC: 3.36 MIL/uL — ABNORMAL LOW (ref 3.87–5.11)
RDW: 13.2 % (ref 11.5–15.5)
WBC: 9.5 10*3/uL (ref 4.0–10.5)
nRBC: 0 % (ref 0.0–0.2)

## 2019-04-01 LAB — PROTEIN / CREATININE RATIO, URINE
Creatinine, Urine: 89 mg/dL
Protein Creatinine Ratio: 0.18 mg/mg{Cre} — ABNORMAL HIGH (ref 0.00–0.15)
Total Protein, Urine: 16 mg/dL

## 2019-04-01 LAB — CULTURE, BETA STREP (GROUP B ONLY): Strep Gp B Culture: NEGATIVE

## 2019-04-01 LAB — HEPATITIS C VRS RNA DETECT BY PCR-QUAL: Hepatitis C Vrs RNA by PCR-Qual: NEGATIVE

## 2019-04-01 MED ORDER — NIFEDIPINE 10 MG PO CAPS
10.0000 mg | ORAL_CAPSULE | ORAL | Status: DC | PRN
  Administered 2019-04-01: 13:00:00 10 mg via ORAL
  Filled 2019-04-01 (×2): qty 1

## 2019-04-01 MED ORDER — NIFEDIPINE 10 MG PO CAPS
20.0000 mg | ORAL_CAPSULE | ORAL | Status: DC | PRN
  Filled 2019-04-01: qty 2

## 2019-04-01 MED ORDER — LABETALOL HCL 5 MG/ML IV SOLN: 40.0000 mg | INTRAVENOUS | Status: DC | PRN

## 2019-04-01 NOTE — Progress Notes (Addendum)
CSW received return call from [facility] CPS Intake Worker, Burnis Kingfisher. Report made.   Rinaldo Cloud reported the family does have an open CPS case with them. Worker is [phone].   CSW spoke with [place]. [place] reported Baby can't discharge to the family until a Safety Plan is in place. Rinaldo Cloud reported she will inform CSW when a Safety Plan is in place. She reported there are not any visiting restrictions, MOB and FOB can continue to visit with Baby in NICU. CSW informed MOB's RN Zella Ball and Baby's NICU RN Elmarie Shiley of this.   Alfonso Ramus, [place] [phone]

## 2019-04-01 NOTE — Progress Notes (Signed)
Pts vital signs were checked at 2340 and slightly elevated at 153/106. RN informed pt she would recheck VS in 20 minutes. RN returned to room at 0000 and pt had stepped off the unit. BP was rechecked upon arrival at 0024, 153/104. RN educated pt on signs and symptoms to report such as dizziness, epigastric pain, headache, or blurred vision. Pt verbalized understanding.

## 2019-04-01 NOTE — Progress Notes (Signed)
Patient discharged home without infant. Infant in SCN. Discharge instructions reviewed and given to pt. Pt verbalized understanding. Escorted out on her own, pt wanted to walk out with significant other. Marland Kitchen

## 2019-04-01 NOTE — Clinical Social Work Maternal (Addendum)
CLINICAL SOCIAL WORK MATERNAL/CHILD NOTE  Patient Details  Name: Alexis Avery MRN: [id number] Date of Birth: [birth year]  Date:  04/01/2019  Clinical Social Worker Initiating Note:  Alexis Genin, LCSW Date/Time: Initiated:  04/01/19/      Child's Name:  Alexis Avery   Biological Parents:  Mother, Father   Need for Interpreter:  None   Reason for Referral:  Current Substance Use/Substance Use During Pregnancy    Address:  [place] [address]    Phone number:  [phone] (home)     Additional phone number:   Household Members/Support Persons (HM/SP):   Household Member/Support Person 1, Household Member/Support Person 2, Household Member/Support Person 3, Household Member/Support Person 4, Household Member/Support Person 5   HM/SP Name Relationship DOB or Age  HM/SP -[place] spouse unknown  HM/SP -[place] step child 106  HM/SP -[place] child 9  HM/SP -[place] child 5  HM/SP -[place] child 2  HM/SP -6        HM/SP -7        HM/SP -8          Natural Supports (not living in the home):  Extended Family, Immediate Family   Professional Supports:     Employment: Unemployed   Type of Work:     Education:  Programmer, systems   Homebound arranged:    Museum/gallery curator Resources:  [employer]   Other Resources:  Physicist, medical , WIC(In process of applying for [employer])   Cultural/Religious Considerations Which May Impact Care:  None reported  Strengths:  Home prepared for child    Psychotropic Medications:         Pediatrician:       Pediatrician List:   [place]      Pediatrician Fax Number:    Risk Factors/Current Problems:  Substance Use    Cognitive State:  Able to Concentrate , Alert    Mood/Affect:  Calm    CSW Assessment: CSW consulted due to drug exposed newborn. Baby's urine drug screen positive  for Amphetamines.   CSW met with MOB at bedside. FOB, Alexis Avery, also at bedside. CSW explained HIPPA and MOB elected for FOB to remain at bedside during assessment. CSW explained CSW role.   Baby is currently in NICU. CSW explained that CSW will check in every few days while Baby is in NICU. MOB verbalized agreement.  MOB reported she lives with her 3 children, 1 step daughter Alexis Avery) who stays with them at times, and FOB. New Baby Boy named Alexis Avery. MOB reported they have support from [facility] and FOB's mothers as well as other family members.   MOB reported she has a history of anxiety and depression, denied medication use and said she had counseling but it was "years ago." MOB reported she feels her mood is currently being managed using her coping skills. She reported she is aware of mental health support resources if needed. She denied SI, HI, or DV.  CSW provided information sheets and educated on PPD and SIDS. MOB and FOB verbalized understanding.   MOB reported she has a crib, clothing, diapers, a used carseat (CSW informed to check expiration date before using), and other items needed for Baby.   MOB reported she is working on finding a Lexicographer for [employer].  CSW explained Drug Screen Policy and that a CPS Report will be made since Baby's drug screen was positive, per policy. MOB and FOB verbalized understanding. They reported CPS has been involved with their family in the past "a few years ago", could not remember worker's information, but stated it was [facility] (where they live now). MOB denied substance use, stating all she has taken is her Subutex. MOB reported she has been taking Subutex for a few months "at least 3" and feels it is effective treatment and her symptoms are managed on Subutex. MOB reported she gets her Subutex from her Family Physician, Dr. Neta Avery in [place], [place].  [place] called [facility] CPS line and left a voicemail. Awaiting return call to  make report and determine if there are any barriers to [facility] discharge.   CSW will continue to follow.      CSW Plan/Description:  Child Copy Report , CSW Awaiting CPS Disposition Plan, CSW Will Continue to Monitor Umbilical Cord Tissue Drug Screen Results and Make Report if Warranted, [place], LCSW 04/01/2019, 11:02 AM

## 2019-04-01 NOTE — Lactation Note (Signed)
This note was copied from a baby's chart. Lactation Consultation Note  Patient Name: Boy Deshawna Mcneece UCJAR'W Date: 04/01/2019   Mom has current substance use during pregnancy and is on suboxone.  Baby's urine tox screen was (+) for amphetatmines.  Cord blood has been drawn.  Mom declines breast feeding and pumping.  Baby is now in SCN on Morphine.  Hand out given on formula preparation and reviewed.  Discussed how to dry up her milk.  Explained differences and maintenance of breast fullness, engorgement, plugged ducts and mastitis.  Encouraged mom to call with further questions, concerns or assistance.  Maternal Data    Feeding Feeding Type: Formula Nipple Type: Slow - flow  LATCH Score                   Interventions    Lactation Tools Discussed/Used     Consult Status      Louis Meckel 04/01/2019, 4:59 PM

## 2019-04-05 ENCOUNTER — Encounter: Payer: Medicaid Other | Admitting: Obstetrics & Gynecology

## 2019-04-14 ENCOUNTER — Ambulatory Visit: Payer: Self-pay

## 2019-04-14 NOTE — Lactation Note (Signed)
This note was copied from a baby's chart. Lactation Consultation Note  Patient Name: Alexis Avery Date: 04/14/2019   NAS baby only getting formula.  Mom is on suboxone with current substance abuse using during pregnancy.  Mom had no desire to pump or breast feed.  Maternal Data    Feeding Feeding Type: Formula Nipple Type: Slow - flow  LATCH Score                   Interventions    Lactation Tools Discussed/Used     Consult Status      Alexis Avery 04/14/2019, 5:40 PM

## 2019-06-24 ENCOUNTER — Other Ambulatory Visit: Payer: Self-pay

## 2019-06-24 ENCOUNTER — Ambulatory Visit
Admission: EM | Admit: 2019-06-24 | Discharge: 2019-06-24 | Disposition: A | Discharge status: Home / Self Care | Payer: Medicaid Other | Attending: Emergency Medicine | Admitting: Emergency Medicine

## 2019-06-24 ENCOUNTER — Encounter: Payer: Self-pay | Admitting: Emergency Medicine

## 2019-06-24 DIAGNOSIS — K0889 Other specified disorders of teeth and supporting structures: Secondary | ICD-10-CM | POA: Diagnosis not present

## 2019-06-24 DIAGNOSIS — L02811 Cutaneous abscess of head [any part, except face]: Secondary | ICD-10-CM

## 2019-06-24 MED ORDER — FLUCONAZOLE 150 MG PO TABS: 150.0000 mg | ORAL_TABLET | Freq: Every day | ORAL | 0 refills | Status: DC

## 2019-06-24 MED ORDER — AMOXICILLIN-POT CLAVULANATE 875-125 MG PO TABS: 1.0000 | ORAL_TABLET | Freq: Two times a day (BID) | ORAL | 0 refills | Status: DC

## 2019-06-24 NOTE — ED Provider Notes (Signed)
[facility]    CSN: [id number] Arrival date & time: 06/24/19  1403      History   Chief Complaint Chief Complaint  Patient presents with  . Dental Pain  . Abscess    HPI Alexis Avery is a 39 y.o. female presenting for right-sided dental pain x1 week and cephalad scalp abscess x1 week.  Denies fever, dental abscess, mouth injury, drooling, difficulty breathing or swallowing.  Has tried hot compresses without relief.  No arthralgias, myalgias, chest pain, palpitations.   Past Medical History:  Diagnosis Date  . Anxiety   . Depression   . Gestational diabetes mellitus, antepartum    GESTATIONAL  . Gestational hypertension 01/07/2017   Neg baseline: pc ratio, cmp  . 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 Left genital labial abscess 12/2015  . MRSA (methicillin resistant staph aureus) culture positive   . Nausea and vomiting in pregnancy 10/24/2012  . Nicotine addiction 10/24/2012  . Preterm labor     Patient Active Problem List   Diagnosis Date Noted  . Postpartum care following vaginal delivery 04/01/2019  . Labor and delivery, indication for care 03/30/2019  . Supervision of high risk pregnancy, antepartum 03/14/2019  . 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 Opioid use disorder (HCC) 12/03/2018  . Cervical high risk human papillomavirus (HPV) DNA test positive 01/23/2017  . Supervision of high risk pregnancy, antepartum, third trimester 01/07/2017  . MRSA (methicillin resistant staph aureus) culture positive   . Opiate dependence (HCC) 01/21/2014  . History of preterm delivery, currently pregnant 01/16/2013  . Drug use complicating pregnancy 01/16/2013  . Nicotine addiction 10/24/2012  . Anxiety 10/24/2012    Past Surgical History:  Procedure Laterality Date  . CHOLECYSTECTOMY    . TONSILLECTOMY      OB History    Gravida  5   Para  4   Term  3   Preterm  1   AB  1   Living  4     SAB  1   TAB      Ectopic      Multiple  0   Live  Births  4            Home Medications    Prior to Admission medications   Medication Sig Start Date End Date Taking? Authorizing Provider  amoxicillin-clavulanate (AUGMENTIN) 875-125 MG tablet Take 1 tablet by mouth every 12 (twelve) hours. 06/24/19   Hall-Potvin, [place], PA-C  buprenorphine (SUBUTEX) 2 MG SUBL SL tablet Place under the tongue daily.    [provider]  cetirizine (ZYRTEC) 10 MG tablet Take 10 mg by mouth daily.    [provider]  fluconazole (DIFLUCAN) 150 MG tablet Take 1 tablet (150 mg total) by mouth daily. May repeat in 72 hours if needed 06/24/19   Hall-Potvin, [place], PA-C  Prenatal Vit-Fe Fumarate-FA (PRENATAL VITAMIN) 27-0.8 MG TABS Take by mouth.    [provider]    Family History Family History  Problem Relation Age of Onset  . Hypertension Mother   . Cancer Maternal Grandmother        breast  . Hypertension Maternal Uncle   . Heart attack Maternal Uncle   . Heart attack Father     Social History Social History   Tobacco Use  . Smoking status: Current Every Day Smoker    Packs/day: 0.50    Types: Cigarettes  . Smokeless tobacco: Never Used  . Tobacco comment: pt states she cut back  Substance  Use Topics  . Alcohol use: No  . Drug use: No    Comment: not used heroin since Feb 2018     Allergies   Patient has no known allergies.   Review of Systems As per HPI   Physical Exam Triage Vital Signs ED Triage Vitals  Enc Vitals Group     BP      Pulse      Resp      Temp      Temp src      SpO2      Weight      Height      Head Circumference      Peak Flow      Pain Score      Pain Loc      Pain Edu?      Excl. in [place]?    No data found.  Updated Vital Signs BP 140/90   Pulse 97   Temp 98.4 F (36.9 C)   Resp 18   SpO2 97%   Visual Acuity Right Eye Distance:   Left Eye Distance:   Bilateral Distance:    Right Eye Near:   Left Eye Near:    Bilateral Near:     Physical  Exam Constitutional:      General: She is not in acute distress. HENT:     Head: Normocephalic and atraumatic.     Mouth/Throat:     Mouth: Mucous membranes are moist.     Comments: Poor dentition.  Right bottom rear molar with significant decay to gumline.  No significant gingival swelling, erythema, abscess. Eyes:     General: No scleral icterus.    Pupils: Pupils are equal, round, and reactive to light.  Cardiovascular:     Rate and Rhythm: Normal rate.  Pulmonary:     Effort: Pulmonary effort is normal.  Skin:    Coloration: Skin is not jaundiced or pale.     Comments: 1.5 cm abscess noted to cephalad aspect of left scalp.  No active drainage or bleeding.  Neurological:     Mental Status: She is alert and oriented to person, place, and time.      UC Treatments / Results  Labs (all labs ordered are listed, but only abnormal results are displayed) Labs Reviewed - No data to display  EKG   Radiology No results found.  Procedures Procedures (including critical care time)  Medications Ordered in UC Medications - No data to display  Initial Impression / Assessment and Plan / UC Course  I have reviewed the triage vital signs and the nursing notes.  Pertinent labs & imaging results that were available during my care of the patient were reviewed by me and considered in my medical decision making (see chart for details).     Patient febrile, nontoxic in office today.  Declined I&D for scalp abscess.  Given cutaneous and dental infection, will start Augmentin.  Provided low cost community dental resources as well as surgical office contact information should patient have recurrent abscesses.  Return precautions discussed, patient verbalized understanding and is agreeable to plan. Final Clinical Impressions(s) / UC Diagnoses   Final diagnoses:  Scalp abscess  Pain, dental     Discharge Instructions     Low-Cost Community Dental Resources:  [facility] Address: [address] [place], [place], [address] Phone: [phone]  - Dr. Donn Pierini Address: [address], [place], [place], [address] Phone: [phone]    ED Prescriptions  Medication Sig Dispense Auth. Provider   amoxicillin-clavulanate (AUGMENTIN) 875-125 MG tablet Take 1 tablet by mouth every 12 (twelve) hours. 14 tablet Hall-Potvin, [place], PA-C   fluconazole (DIFLUCAN) 150 MG tablet Take 1 tablet (150 mg total) by mouth daily. May repeat in 72 hours if needed 2 tablet Hall-Potvin, [place], PA-C     I have reviewed the PDMP during this encounter.   Hall-Potvin, [place], [place] 06/24/19 1548

## 2019-06-24 NOTE — Discharge Instructions (Signed)
Low-Cost Community Dental Resources:  [facility] Address: [address], [place], [place], [address] Phone: [phone]  - Dr. Civils Address: [address], [place], [place], [address] Phone: [phone] 

## 2019-06-24 NOTE — ED Triage Notes (Signed)
Dental pain x 1 week Abscess to scalp x 1 week

## 2020-03-11 ENCOUNTER — Encounter: Payer: Self-pay | Admitting: Emergency Medicine

## 2020-03-11 ENCOUNTER — Ambulatory Visit
Admission: EM | Admit: 2020-03-11 | Discharge: 2020-03-11 | Disposition: A | Discharge status: Home / Self Care | Payer: Medicaid Other | Attending: Family Medicine | Admitting: Family Medicine

## 2020-03-11 ENCOUNTER — Other Ambulatory Visit: Payer: Self-pay

## 2020-03-11 DIAGNOSIS — L03811 Cellulitis of head [any part, except face]: Secondary | ICD-10-CM

## 2020-03-11 DIAGNOSIS — Z8614 Personal history of Methicillin resistant Staphylococcus aureus infection: Secondary | ICD-10-CM

## 2020-03-11 MED ORDER — DOXYCYCLINE HYCLATE 100 MG PO CAPS: 100.0000 mg | ORAL_CAPSULE | Freq: Two times a day (BID) | ORAL | 0 refills | Status: AC

## 2020-03-11 MED ORDER — FLUCONAZOLE 150 MG PO TABS: 150.0000 mg | ORAL_TABLET | Freq: Once | ORAL | 0 refills | Status: AC

## 2020-03-11 NOTE — ED Triage Notes (Signed)
Patient c/o weakness x 1 week.   Patient denies fever at home.   Patient endorses an abscess located on RT side of hairline on forehead. Patient states "this area has become more sore and tender".   Patient reports a history of "staph infection" and is concerned of a recurrent infection.   Patient hasn't taken any medications for symptoms.

## 2020-03-11 NOTE — Discharge Instructions (Addendum)
Complete entire course of medication.  Apply Bactroban or Neosporin directly to the affected area.  Follow-up with primary care provider if infection worsens or does not resolve.

## 2020-03-11 NOTE — ED Provider Notes (Signed)
[facility]    CSN: [id number] Arrival date & time: 03/11/20  1306      History   Chief Complaint Chief Complaint  Patient presents with  . Weakness  . Abscess    HPI Alexis Avery is a 40 y.o. female.   HPI  Patient presents with a abscess localized to the scalp (right-sided) of her hairline.  Patient has a history of MRSA.  She reports that previously when abscess occurred she was treated with antibiotics and symptoms resolved without any additional interventions.  She reports some scant oozing although she has attempted to avoid manipulating the area as it is tender to touch.  She endorses feeling weakness and fatigue since the development of the abscess.  Denies fever.  Past Medical History:  Diagnosis Date  . Anxiety   . Depression   . Gestational diabetes mellitus, antepartum    GESTATIONAL  . Gestational hypertension 01/07/2017   Neg baseline: pc ratio, cmp  . 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 Left genital labial abscess 12/2015  . MRSA (methicillin resistant staph aureus) culture positive   . Nausea and vomiting in pregnancy 10/24/2012  . Nicotine addiction 10/24/2012  . Preterm labor     Patient Active Problem List   Diagnosis Date Noted  . Postpartum care following vaginal delivery 04/01/2019  . Labor and delivery, indication for care 03/30/2019  . Supervision of high risk pregnancy, antepartum 03/14/2019  . 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 Opioid use disorder 12/03/2018  . Cervical high risk human papillomavirus (HPV) DNA test positive 01/23/2017  . Supervision of high risk pregnancy, antepartum, third trimester 01/07/2017  . MRSA (methicillin resistant staph aureus) culture positive   . Opiate dependence (HCC) 01/21/2014  . History of preterm delivery, currently pregnant 01/16/2013  . Drug use complicating pregnancy 01/16/2013  . Nicotine addiction 10/24/2012  . Anxiety 10/24/2012    Past Surgical History:  Procedure Laterality Date  .  CHOLECYSTECTOMY    . TONSILLECTOMY      OB History    Gravida  5   Para  4   Term  3   Preterm  1   AB  1   Living  4     SAB  1   IAB      Ectopic      Multiple  0   Live Births  4            Home Medications    Prior to Admission medications   Medication Sig Start Date End Date Taking? Authorizing Provider  amoxicillin-clavulanate (AUGMENTIN) 875-125 MG tablet Take 1 tablet by mouth every 12 (twelve) hours. 06/24/19   Hall-Potvin, [place], PA-C  buprenorphine (SUBUTEX) 2 MG SUBL SL tablet Place under the tongue daily.    [provider]  cetirizine (ZYRTEC) 10 MG tablet Take 10 mg by mouth daily.    [provider]  fluconazole (DIFLUCAN) 150 MG tablet Take 1 tablet (150 mg total) by mouth daily. May repeat in 72 hours if needed 06/24/19   Hall-Potvin, [place], PA-C  Prenatal Vit-Fe Fumarate-FA (PRENATAL VITAMIN) 27-0.8 MG TABS Take by mouth.    [provider]    Family History Family History  Problem Relation Age of Onset  . Hypertension Mother   . Cancer Maternal Grandmother        breast  . Hypertension Maternal Uncle   . Heart attack Maternal Uncle   . Heart attack Father     Social History Social History  Tobacco Use  . Smoking status: Current Every Day Smoker    Packs/day: 0.50    Types: Cigarettes  . Smokeless tobacco: Never Used  . Tobacco comment: pt states she cut back  Substance Use Topics  . Alcohol use: No  . Drug use: No    Comment: not used heroin since Feb 2018     Allergies   Patient has no known allergies.   Review of Systems Review of Systems Pertinent negatives listed in HPI   Physical Exam Triage Vital Signs ED Triage Vitals  Enc Vitals Group     BP 03/11/20 1341 (!) 140/96     Pulse Rate 03/11/20 1341 (!) 107     Resp 03/11/20 1341 16     Temp 03/11/20 1341 98.4 F (36.9 C)     Temp Source 03/11/20 1341 Oral     SpO2 03/11/20 1341 99 %     Weight --      Height --       Head Circumference --      Peak Flow --      Pain Score 03/11/20 1339 3     Pain Loc --      Pain Edu? --      Excl. in GC? --    No data found.  Updated Vital Signs BP (!) 140/96 (BP Location: Left Arm)   Pulse (!) 107   Temp 98.4 F (36.9 C) (Oral)   Resp 16   LMP 02/11/2020   SpO2 99%   Visual Acuity Right Eye Distance:   Left Eye Distance:   Bilateral Distance:    Right Eye Near:   Left Eye Near:    Bilateral Near:     Physical Exam Constitutional:      Appearance: Normal appearance.  Cardiovascular:     Rate and Rhythm: Normal rate and regular rhythm.  Pulmonary:     Effort: Pulmonary effort is normal.     Breath sounds: Normal breath sounds.  Musculoskeletal:        General: Normal range of motion.  Skin:    General: Skin is warm and dry.     Capillary Refill: Capillary refill takes less than 2 seconds.       Neurological:     General: No focal deficit present.     Mental Status: She is alert.  Psychiatric:        Mood and Affect: Mood normal.        Behavior: Behavior normal.        Thought Content: Thought content normal.        Judgment: Judgment normal.    UC Treatments / Results  Labs (all labs ordered are listed, but only abnormal results are displayed) Labs Reviewed - No data to display  EKG   Radiology No results found.  Procedures Procedures (including critical care time)  Medications Ordered in UC Medications - No data to display  Initial Impression / Assessment and Plan / UC Course  I have reviewed the triage vital signs and the nursing notes.  Pertinent labs & imaging results that were available during my care of the patient were reviewed by me and considered in my medical decision making (see chart for details).    Acute abscess of the scalp treating with doxycycline 100 mg twice daily for 10 days.  Fluconazole prescribed for prophylaxis against yeast.  Also recommended application of Neosporin or bacitracin directly to the  site.  Return precautions discussed. Final Clinical Impressions(s) /  UC Diagnoses   Final diagnoses:  Abscess or cellulitis of scalp  Hx MRSA infection     Discharge Instructions     Complete entire course of medication.  Apply Bactroban or Neosporin directly to the affected area.  Follow-up with primary care provider if infection worsens or does not resolve.    ED Prescriptions    Medication Sig Dispense Auth. Provider   doxycycline (VIBRAMYCIN) 100 MG capsule Take 1 capsule (100 mg total) by mouth 2 (two) times daily. 20 capsule Bing Neighbors, FNP   fluconazole (DIFLUCAN) 150 MG tablet Take 1 tablet (150 mg total) by mouth once for 1 dose. Repeat if needed 2 tablet Bing Neighbors, FNP     PDMP not reviewed this encounter.   Bing Neighbors, FNP 03/14/20 1527

## 2022-06-30 DIAGNOSIS — O9902 Anemia complicating childbirth: Secondary | ICD-10-CM | POA: Diagnosis not present

## 2022-06-30 DIAGNOSIS — R109 Unspecified abdominal pain: Secondary | ICD-10-CM | POA: Diagnosis not present

## 2022-06-30 DIAGNOSIS — Z3A35 35 weeks gestation of pregnancy: Secondary | ICD-10-CM | POA: Diagnosis not present

## 2022-06-30 DIAGNOSIS — Z3689 Encounter for other specified antenatal screening: Secondary | ICD-10-CM | POA: Diagnosis not present

## 2022-06-30 DIAGNOSIS — Z8632 Personal history of gestational diabetes: Secondary | ICD-10-CM | POA: Diagnosis not present

## 2022-08-22 DIAGNOSIS — R5383 Other fatigue: Secondary | ICD-10-CM | POA: Diagnosis not present

## 2022-08-22 DIAGNOSIS — Z0000 Encounter for general adult medical examination without abnormal findings: Secondary | ICD-10-CM | POA: Diagnosis not present

## 2022-08-22 DIAGNOSIS — Z79899 Other long term (current) drug therapy: Secondary | ICD-10-CM | POA: Diagnosis not present

## 2022-08-22 DIAGNOSIS — M129 Arthropathy, unspecified: Secondary | ICD-10-CM | POA: Diagnosis not present

## 2022-08-22 DIAGNOSIS — E559 Vitamin D deficiency, unspecified: Secondary | ICD-10-CM | POA: Diagnosis not present

## 2022-08-24 DIAGNOSIS — Z79899 Other long term (current) drug therapy: Secondary | ICD-10-CM | POA: Diagnosis not present

## 2022-08-29 DIAGNOSIS — Z79899 Other long term (current) drug therapy: Secondary | ICD-10-CM | POA: Diagnosis not present

## 2022-08-29 DIAGNOSIS — Z862 Personal history of diseases of the blood and blood-forming organs and certain disorders involving the immune mechanism: Secondary | ICD-10-CM | POA: Diagnosis not present

## 2022-08-29 DIAGNOSIS — Z0000 Encounter for general adult medical examination without abnormal findings: Secondary | ICD-10-CM | POA: Diagnosis not present

## 2022-08-31 DIAGNOSIS — Z79899 Other long term (current) drug therapy: Secondary | ICD-10-CM | POA: Diagnosis not present

## 2023-10-27 ENCOUNTER — Other Ambulatory Visit: Payer: Self-pay | Admitting: Physician Assistant

## 2023-10-27 DIAGNOSIS — Z1231 Encounter for screening mammogram for malignant neoplasm of breast: Secondary | ICD-10-CM

## 2023-11-10 ENCOUNTER — Encounter
